# Patient Record
Sex: Female | Born: 1985 | Race: Black or African American | Hispanic: No | Marital: Single | Smoking: Never smoker
Health system: Southern US, Community
[De-identification: ages and names within clinical notes are randomized; demographics above are authoritative.]

## PROBLEM LIST (undated history)

## (undated) DIAGNOSIS — IMO0002 Reserved for concepts with insufficient information to code with codable children: Secondary | ICD-10-CM

## (undated) DIAGNOSIS — M545 Low back pain: Secondary | ICD-10-CM

## (undated) DIAGNOSIS — D649 Anemia, unspecified: Secondary | ICD-10-CM

## (undated) DIAGNOSIS — G43909 Migraine, unspecified, not intractable, without status migrainosus: Secondary | ICD-10-CM

## (undated) DIAGNOSIS — K219 Gastro-esophageal reflux disease without esophagitis: Secondary | ICD-10-CM

## (undated) HISTORY — DX: Migraine, unspecified, not intractable, without status migrainosus: G43.909

## (undated) HISTORY — PX: TONSILLECTOMY: SUR1361

---
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
## 2004-08-08 ENCOUNTER — Emergency Department: Payer: Self-pay | Admitting: Unknown Physician Specialty

## 2005-03-01 ENCOUNTER — Emergency Department: Payer: Self-pay | Admitting: Emergency Medicine

## 2005-03-01 ENCOUNTER — Other Ambulatory Visit: Payer: Self-pay

## 2005-10-16 ENCOUNTER — Emergency Department: Payer: Self-pay | Admitting: General Practice

## 2006-08-21 ENCOUNTER — Emergency Department: Payer: Self-pay | Admitting: Emergency Medicine

## 2006-12-04 ENCOUNTER — Emergency Department: Payer: Self-pay | Admitting: Emergency Medicine

## 2007-03-01 ENCOUNTER — Emergency Department: Payer: Self-pay | Admitting: Emergency Medicine

## 2008-08-15 ENCOUNTER — Emergency Department: Payer: Self-pay | Admitting: Emergency Medicine

## 2009-09-21 DIAGNOSIS — D509 Iron deficiency anemia, unspecified: Secondary | ICD-10-CM | POA: Insufficient documentation

## 2010-02-15 ENCOUNTER — Ambulatory Visit: Payer: Self-pay | Admitting: Family Medicine

## 2010-06-16 ENCOUNTER — Emergency Department: Payer: Self-pay | Admitting: Emergency Medicine

## 2010-07-13 ENCOUNTER — Emergency Department: Payer: Self-pay | Admitting: Internal Medicine

## 2010-11-03 ENCOUNTER — Observation Stay: Payer: Self-pay

## 2010-11-09 ENCOUNTER — Observation Stay: Payer: Self-pay | Admitting: Obstetrics and Gynecology

## 2011-01-28 ENCOUNTER — Observation Stay: Payer: Self-pay | Admitting: Obstetrics and Gynecology

## 2011-02-04 ENCOUNTER — Inpatient Hospital Stay: Payer: Self-pay | Admitting: Obstetrics & Gynecology

## 2012-08-04 ENCOUNTER — Emergency Department: Payer: Self-pay | Admitting: Emergency Medicine

## 2012-08-04 LAB — COMPREHENSIVE METABOLIC PANEL
Albumin: 3.3 g/dL — ABNORMAL LOW (ref 3.4–5.0)
Alkaline Phosphatase: 105 U/L (ref 50–136)
BUN: 14 mg/dL (ref 7–18)
Bilirubin,Total: 0.2 mg/dL (ref 0.2–1.0)
Calcium, Total: 8.5 mg/dL (ref 8.5–10.1)
Chloride: 105 mmol/L (ref 98–107)
Co2: 30 mmol/L (ref 21–32)
Creatinine: 0.81 mg/dL (ref 0.60–1.30)
EGFR (African American): >60
Glucose: 102 mg/dL — ABNORMAL HIGH (ref 65–99)
Osmolality: 274 (ref 275–301)
SGPT (ALT): 13 U/L (ref 12–78)
Total Protein: 8 g/dL (ref 6.4–8.2)

## 2012-08-04 LAB — URINALYSIS, COMPLETE
Bacteria: NONE SEEN
Glucose,UR: NEGATIVE mg/dL (ref 0–75)
Ketone: NEGATIVE
Nitrite: NEGATIVE
Ph: 6 (ref 4.5–8.0)
Protein: NEGATIVE

## 2012-08-04 LAB — CBC
HGB: 10.3 g/dL — ABNORMAL LOW (ref 12.0–16.0)
MCV: 67 fL — ABNORMAL LOW (ref 80–100)
Platelet: 413 10*3/uL (ref 150–440)
RBC: 4.93 10*6/uL (ref 3.80–5.20)

## 2013-06-13 ENCOUNTER — Emergency Department: Payer: Self-pay | Admitting: Emergency Medicine

## 2013-06-13 LAB — CBC
HCT: 29.7 % — AB (ref 35.0–47.0)
HGB: 9.3 g/dL — ABNORMAL LOW (ref 12.0–16.0)
MCH: 20.8 pg — ABNORMAL LOW (ref 26.0–34.0)
MCHC: 31.2 g/dL — ABNORMAL LOW (ref 32.0–36.0)
MCV: 67 fL — AB (ref 80–100)
Platelet: 404 10*3/uL (ref 150–440)
RBC: 4.44 10*6/uL (ref 3.80–5.20)
RDW: 18.8 % — ABNORMAL HIGH (ref 11.5–14.5)
WBC: 11.5 10*3/uL — ABNORMAL HIGH (ref 3.6–11.0)

## 2013-06-13 LAB — BASIC METABOLIC PANEL
Anion Gap: 5 — ABNORMAL LOW (ref 7–16)
BUN: 12 mg/dL (ref 7–18)
Calcium, Total: 8.8 mg/dL (ref 8.5–10.1)
Chloride: 107 mmol/L (ref 98–107)
Co2: 28 mmol/L (ref 21–32)
Creatinine: 0.83 mg/dL (ref 0.60–1.30)
EGFR (African American): >60
Glucose: 99 mg/dL (ref 65–99)
OSMOLALITY: 279 (ref 275–301)
Potassium: 3.8 mmol/L (ref 3.5–5.1)
Sodium: 140 mmol/L (ref 136–145)

## 2013-06-13 LAB — URINALYSIS, COMPLETE
Bacteria: NONE SEEN
Bilirubin,UR: NEGATIVE
Glucose,UR: NEGATIVE mg/dL (ref 0–75)
Ketone: NEGATIVE
NITRITE: NEGATIVE
Ph: 6 (ref 4.5–8.0)
Protein: 30
RBC,UR: 5164 /HPF (ref 0–5)
SPECIFIC GRAVITY: 1.02 (ref 1.003–1.030)
Squamous Epithelial: 2

## 2013-07-22 ENCOUNTER — Emergency Department: Payer: Self-pay | Admitting: Emergency Medicine

## 2013-07-25 ENCOUNTER — Ambulatory Visit: Payer: Self-pay | Admitting: Internal Medicine

## 2013-07-26 LAB — CANCER CENTER HEMOGLOBIN: HGB: 8.2 g/dL — ABNORMAL LOW (ref 12.0–16.0)

## 2013-08-04 LAB — HEMOGLOBIN: HGB: 8.7 g/dL — ABNORMAL LOW (ref 12.0–16.0)

## 2013-08-09 LAB — HEMOGLOBIN: HGB: 9.1 g/dL — AB (ref 12.0–16.0)

## 2013-08-12 ENCOUNTER — Ambulatory Visit: Payer: Self-pay | Admitting: Internal Medicine

## 2013-09-13 ENCOUNTER — Ambulatory Visit: Payer: Self-pay | Admitting: Internal Medicine

## 2014-02-01 ENCOUNTER — Emergency Department: Payer: Self-pay | Admitting: Emergency Medicine

## 2014-02-01 LAB — URINALYSIS, COMPLETE
BILIRUBIN, UR: NEGATIVE
Bacteria: NONE SEEN
Blood: NEGATIVE
Glucose,UR: NEGATIVE mg/dL (ref 0–75)
Hyaline Cast: 2
Ketone: NEGATIVE
NITRITE: NEGATIVE
Ph: 6 (ref 4.5–8.0)
Protein: NEGATIVE
RBC,UR: 2 /HPF (ref 0–5)
Specific Gravity: 1.024 (ref 1.003–1.030)
Squamous Epithelial: 5

## 2014-12-14 ENCOUNTER — Ambulatory Visit: Payer: Self-pay | Admitting: Family Medicine

## 2014-12-17 ENCOUNTER — Encounter: Payer: Self-pay | Admitting: Emergency Medicine

## 2014-12-17 ENCOUNTER — Emergency Department
Admission: EM | Admit: 2014-12-17 | Discharge: 2014-12-18 | Disposition: A | Discharge status: Home / Self Care | Payer: 59 | Attending: Emergency Medicine | Admitting: Emergency Medicine

## 2014-12-17 DIAGNOSIS — R1013 Epigastric pain: Secondary | ICD-10-CM | POA: Insufficient documentation

## 2014-12-17 DIAGNOSIS — R5381 Other malaise: Secondary | ICD-10-CM | POA: Diagnosis not present

## 2014-12-17 DIAGNOSIS — N390 Urinary tract infection, site not specified: Secondary | ICD-10-CM | POA: Insufficient documentation

## 2014-12-17 DIAGNOSIS — Z3202 Encounter for pregnancy test, result negative: Secondary | ICD-10-CM | POA: Insufficient documentation

## 2014-12-17 DIAGNOSIS — R109 Unspecified abdominal pain: Secondary | ICD-10-CM | POA: Diagnosis present

## 2014-12-17 HISTORY — DX: Anemia, unspecified: D64.9

## 2014-12-17 LAB — COMPREHENSIVE METABOLIC PANEL
ALK PHOS: 93 U/L (ref 38–126)
ALT: 11 U/L — ABNORMAL LOW (ref 14–54)
AST: 22 U/L (ref 15–41)
Albumin: 3.5 g/dL (ref 3.5–5.0)
Anion gap: 6 (ref 5–15)
BILIRUBIN TOTAL: 0.2 mg/dL — AB (ref 0.3–1.2)
BUN: 13 mg/dL (ref 6–20)
CALCIUM: 8.9 mg/dL (ref 8.9–10.3)
CO2: 28 mmol/L (ref 22–32)
Chloride: 104 mmol/L (ref 101–111)
Creatinine, Ser: 0.64 mg/dL (ref 0.44–1.00)
GFR calc Af Amer: >60 mL/min (ref >60)
GLUCOSE: 120 mg/dL — AB (ref 65–99)
POTASSIUM: 3.9 mmol/L (ref 3.5–5.1)
Sodium: 138 mmol/L (ref 135–145)
TOTAL PROTEIN: 7.8 g/dL (ref 6.5–8.1)

## 2014-12-17 LAB — CBC
HEMATOCRIT: 31.6 % — AB (ref 35.0–47.0)
Hemoglobin: 9.6 g/dL — ABNORMAL LOW (ref 12.0–16.0)
MCH: 18.8 pg — ABNORMAL LOW (ref 26.0–34.0)
MCHC: 30.3 g/dL — AB (ref 32.0–36.0)
MCV: 62.1 fL — ABNORMAL LOW (ref 80.0–100.0)
PLATELETS: 466 10*3/uL — AB (ref 150–440)
RBC: 5.09 MIL/uL (ref 3.80–5.20)
RDW: 20.8 % — AB (ref 11.5–14.5)
WBC: 12.2 10*3/uL — AB (ref 3.6–11.0)

## 2014-12-17 LAB — URINALYSIS COMPLETE WITH MICROSCOPIC (ARMC ONLY)
BILIRUBIN URINE: NEGATIVE
GLUCOSE, UA: NEGATIVE mg/dL
HGB URINE DIPSTICK: NEGATIVE
KETONES UR: NEGATIVE mg/dL
NITRITE: NEGATIVE
Protein, ur: NEGATIVE mg/dL
SPECIFIC GRAVITY, URINE: 1.024 (ref 1.005–1.030)
pH: 7 (ref 5.0–8.0)

## 2014-12-17 LAB — LIPASE, BLOOD: Lipase: 25 U/L (ref 22–51)

## 2014-12-17 LAB — POCT PREGNANCY, URINE: PREG TEST UR: NEGATIVE

## 2014-12-17 NOTE — ED Notes (Addendum)
Pt reports LUQ pain, reports drinking alcohol last night. Pt reports weakness and "really really bad and weird". Pt reports nausea, denies vomiting or diarrhea. Pt reports hx of anemia and needing 2 blood transfusions in the past, reports feeling the same today.

## 2014-12-18 MED ORDER — SULFAMETHOXAZOLE-TRIMETHOPRIM 800-160 MG PO TABS: 1.0000 | ORAL_TABLET | Freq: Two times a day (BID) | ORAL | Status: DC

## 2014-12-18 MED ORDER — FAMOTIDINE 40 MG/5ML PO SUSR: 20.0000 mg | Freq: Two times a day (BID) | ORAL | Status: DC

## 2014-12-18 MED ORDER — DEXTROSE 5 % IV SOLN
1.0000 g | INTRAVENOUS | Status: DC
  Administered 2014-12-18: 1 g via INTRAVENOUS
  Filled 2014-12-18: qty 10

## 2014-12-18 MED ORDER — SODIUM CHLORIDE 0.9 % IV BOLUS (SEPSIS)
1000.0000 mL | Freq: Once | INTRAVENOUS | Status: AC
  Administered 2014-12-18: 1000 mL via INTRAVENOUS

## 2014-12-18 MED ORDER — ONDANSETRON HCL 4 MG/2ML IJ SOLN
4.0000 mg | Freq: Once | INTRAMUSCULAR | Status: AC
  Administered 2014-12-18: 4 mg via INTRAVENOUS
  Filled 2014-12-18: qty 2

## 2014-12-18 MED ORDER — PANTOPRAZOLE SODIUM 40 MG IV SOLR
40.0000 mg | Freq: Once | INTRAVENOUS | Status: AC
  Administered 2014-12-18: 40 mg via INTRAVENOUS
  Filled 2014-12-18: qty 40

## 2014-12-18 NOTE — Discharge Instructions (Signed)
1. Take antibiotic as prescribed (Septra DS twice daily 7 days). 2. Take Pepcid twice daily. 3. Return to the ER for worsening symptoms, persistent vomiting, fever or other concerns.  Abdominal Pain Many things can cause belly (abdominal) pain. Most times, the belly pain is not dangerous. Many cases of belly pain can be watched and treated at home. HOME CARE   Do not take medicines that help you go poop (laxatives) unless told to by your doctor.  Only take medicine as told by your doctor.  Eat or drink as told by your doctor. Your doctor will tell you if you should be on a special diet. GET HELP IF:  You do not know what is causing your belly pain.  You have belly pain while you are sick to your stomach (nauseous) or have runny poop (diarrhea).  You have pain while you pee or poop.  Your belly pain wakes you up at night.  You have belly pain that gets worse or better when you eat.  You have belly pain that gets worse when you eat fatty foods.  You have a fever. GET HELP RIGHT AWAY IF:   The pain does not go away within 2 hours.  You keep throwing up (vomiting).  The pain changes and is only in the right or left part of the belly.  You have bloody or tarry looking poop. MAKE SURE YOU:   Understand these instructions.  Will watch your condition.  Will get help right away if you are not doing well or get worse. Document Released: 09/17/2007 Document Revised: 04/05/2013 Document Reviewed: 12/08/2012 [facility] Patient Information 2015 [place], [place]. This information is not intended to replace advice given to you by your health care provider. Make sure you discuss any questions you have with your health care provider.  Urinary Tract Infection Urinary tract infections (UTIs) can develop anywhere along your urinary tract. Your urinary tract is your body's drainage system for removing wastes and extra water. Your urinary tract includes two kidneys, two ureters, a bladder, and  a urethra. Your kidneys are a pair of bean-shaped organs. Each kidney is about the size of your fist. They are located below your ribs, one on each side of your spine. CAUSES Infections are caused by microbes, which are microscopic organisms, including fungi, viruses, and bacteria. These organisms are so small that they can only be seen through a microscope. Bacteria are the microbes that most commonly cause UTIs. SYMPTOMS  Symptoms of UTIs may vary by age and gender of the patient and by the location of the infection. Symptoms in young women typically include a frequent and intense urge to urinate and a painful, burning feeling in the bladder or urethra during urination. Older women and men are more likely to be tired, shaky, and weak and have muscle aches and abdominal pain. A fever may mean the infection is in your kidneys. Other symptoms of a kidney infection include pain in your back or sides below the ribs, nausea, and vomiting. DIAGNOSIS To diagnose a UTI, your caregiver will ask you about your symptoms. Your caregiver also will ask to provide a urine sample. The urine sample will be tested for bacteria and white blood cells. White blood cells are made by your body to help fight infection. TREATMENT  Typically, UTIs can be treated with medication. Because most UTIs are caused by a bacterial infection, they usually can be treated with the use of antibiotics. The choice of antibiotic and length of treatment depend  on your symptoms and the type of bacteria causing your infection. HOME CARE INSTRUCTIONS  If you were prescribed antibiotics, take them exactly as your caregiver instructs you. Finish the medication even if you feel better after you have only taken some of the medication.  Drink enough water and fluids to keep your urine clear or pale yellow.  Avoid caffeine, tea, and carbonated beverages. They tend to irritate your bladder.  Empty your bladder often. Avoid holding urine for long  periods of time.  Empty your bladder before and after sexual intercourse.  After a bowel movement, women should cleanse from front to back. Use each tissue only once. SEEK MEDICAL CARE IF:   You have back pain.  You develop a fever.  Your symptoms do not begin to resolve within 3 days. SEEK IMMEDIATE MEDICAL CARE IF:   You have severe back pain or lower abdominal pain.  You develop chills.  You have nausea or vomiting.  You have continued burning or discomfort with urination. MAKE SURE YOU:   Understand these instructions.  Will watch your condition.  Will get help right away if you are not doing well or get worse. Document Released: 01/08/2005 Document Revised: 09/30/2011 Document Reviewed: 05/09/2011 [facility] Patient Information 2015 [place], [place]. This information is not intended to replace advice given to you by your health care provider. Make sure you discuss any questions you have with your health care provider.

## 2014-12-18 NOTE — ED Notes (Signed)
MD at bedside for eval.

## 2014-12-18 NOTE — ED Provider Notes (Signed)
[facility] Emergency Department Provider Note  ____________________________________________  Time seen: Approximately 12:21 AM  I have reviewed the triage vital signs and the nursing notes.   HISTORY  Chief Complaint Abdominal Pain    HPI Sherri Robertson is a 29 y.o. female who presents to the ED from home with a chief complaint of generalized malaise, epigastric burning and "feeling really bad and weird". Patient reports heavy EtOH use last evening. Denies fall, trauma, injury. Denies fever, chills, chest pain, shortness of breath, vomiting, diarrhea. Endorses nausea.   Past Medical History  Diagnosis Date  . Anemia     There are no active problems to display for this patient.   Past Surgical History  Procedure Laterality Date  . Cesarean section      No current outpatient prescriptions on file.  Allergies Review of patient's allergies indicates no known allergies.  No family history on file.  Social History Social History  Substance Use Topics  . Smoking status: Never Smoker   . Smokeless tobacco: None  . Alcohol Use: Yes     Comment: occassional    Review of Systems Constitutional: Positive for generalized malaise. No fever/chills Eyes: No visual changes. ENT: No sore throat. Cardiovascular: Denies chest pain. Respiratory: Denies shortness of breath. Gastrointestinal: Positive for abdominal pain.  Positive for nausea, no vomiting.  No diarrhea.  No constipation. Genitourinary: Negative for dysuria. Musculoskeletal: Negative for back pain. Skin: Negative for rash. Neurological: Negative for headaches, focal weakness or numbness.  10-point ROS otherwise negative.  ____________________________________________   PHYSICAL EXAM:  VITAL SIGNS: ED Triage Vitals  Enc Vitals Group     BP 12/17/14 2056 125/82 mmHg     Pulse Rate 12/17/14 2056 77     Resp 12/17/14 2056 16     Temp 12/17/14 2056 98.7 F (37.1 C)     Temp  Source 12/17/14 2056 Oral     SpO2 12/17/14 2056 99 %     Weight 12/17/14 2056 265 lb (120.203 kg)     Height 12/17/14 2056 5\' 2"  (1.575 m)     Head Cir --      Peak Flow --      Pain Score 12/17/14 2056 9     Pain Loc --      Pain Edu? --      Excl. in [place]? --     Constitutional: Alert and oriented. Well appearing and in no acute distress. Eyes: Conjunctivae are normal. PERRL. EOMI. Head: Atraumatic. Nose: No congestion/rhinnorhea. Mouth/Throat: Mucous membranes are moist.  Oropharynx non-erythematous. Neck: No stridor.   Cardiovascular: Normal rate, regular rhythm. Grossly normal heart sounds.  Good peripheral circulation. Respiratory: Normal respiratory effort.  No retractions. Lungs CTAB. Gastrointestinal: Soft and minimally tender to epigastrium without rebound or guarding. No distention. No abdominal bruits. No CVA tenderness. Musculoskeletal: No lower extremity tenderness nor edema.  No joint effusions. Neurologic:  Normal speech and language. No gross focal neurologic deficits are appreciated. No gait instability. Skin:  Skin is warm, dry and intact. No rash noted. Psychiatric: Mood and affect are normal. Speech and behavior are normal.  ____________________________________________   LABS (all labs ordered are listed, but only abnormal results are displayed)  Labs Reviewed  COMPREHENSIVE METABOLIC PANEL - Abnormal; Notable for the following:    Glucose, Bld 120 (*)    ALT 11 (*)    Total Bilirubin 0.2 (*)    All other components within normal limits  CBC - Abnormal; Notable for the  following:    WBC 12.2 (*)    Hemoglobin 9.6 (*)    HCT 31.6 (*)    MCV 62.1 (*)    MCH 18.8 (*)    MCHC 30.3 (*)    RDW 20.8 (*)    Platelets 466 (*)    All other components within normal limits  URINALYSIS COMPLETEWITH MICROSCOPIC ([facility] ONLY) - Abnormal; Notable for the following:    Color, Urine YELLOW (*)    APPearance HAZY (*)    Leukocytes, UA 3+ (*)    Bacteria, UA RARE (*)     Squamous Epithelial / LPF 6-30 (*)    All other components within normal limits  LIPASE, BLOOD  POC URINE PREG, ED  POCT PREGNANCY, URINE   ____________________________________________  EKG  None ____________________________________________  RADIOLOGY  None ____________________________________________   PROCEDURES  Procedure(s) performed: None  Critical Care performed: No  ____________________________________________   INITIAL IMPRESSION / ASSESSMENT AND PLAN / ED COURSE  Pertinent labs & imaging results that were available during my care of the patient were reviewed by me and considered in my medical decision making (see chart for details).  29 year old female who presents with generalized malaise, epigastric discomfort and nausea after heavy EtOH use last evening. Laboratory and urinalysis results remarkable for UTI. Will infuse IV fluids, IV antibiotic and Protonix.  ----------------------------------------- 2:37 AM on 12/18/2014 -----------------------------------------  Patient improved. Discussed with patient and spouse and given strict return precautions. Both verbalize understanding and agree with plan of care. ____________________________________________   FINAL CLINICAL IMPRESSION(S) / ED DIAGNOSES  Final diagnoses:  UTI (lower urinary tract infection)  Epigastric pain  Lenn Sink, MD 12/18/14 [phone]

## 2015-01-26 ENCOUNTER — Ambulatory Visit (INDEPENDENT_AMBULATORY_CARE_PROVIDER_SITE_OTHER): Payer: 59 | Admitting: Family Medicine

## 2015-01-26 ENCOUNTER — Encounter: Payer: Self-pay | Admitting: Family Medicine

## 2015-01-26 VITALS — BP 124/80 | HR 91 | Temp 98.6°F | Resp 16 | Wt 269.8 lb

## 2015-01-26 DIAGNOSIS — F32A Depression, unspecified: Secondary | ICD-10-CM | POA: Insufficient documentation

## 2015-01-26 DIAGNOSIS — R5383 Other fatigue: Secondary | ICD-10-CM | POA: Diagnosis not present

## 2015-01-26 DIAGNOSIS — M545 Low back pain, unspecified: Secondary | ICD-10-CM

## 2015-01-26 DIAGNOSIS — D509 Iron deficiency anemia, unspecified: Secondary | ICD-10-CM

## 2015-01-26 DIAGNOSIS — F419 Anxiety disorder, unspecified: Secondary | ICD-10-CM

## 2015-01-26 DIAGNOSIS — F329 Major depressive disorder, single episode, unspecified: Secondary | ICD-10-CM | POA: Insufficient documentation

## 2015-01-26 DIAGNOSIS — R7309 Other abnormal glucose: Secondary | ICD-10-CM | POA: Insufficient documentation

## 2015-01-26 DIAGNOSIS — IMO0002 Reserved for concepts with insufficient information to code with codable children: Secondary | ICD-10-CM

## 2015-01-26 DIAGNOSIS — D219 Benign neoplasm of connective and other soft tissue, unspecified: Secondary | ICD-10-CM | POA: Insufficient documentation

## 2015-01-26 DIAGNOSIS — N946 Dysmenorrhea, unspecified: Secondary | ICD-10-CM | POA: Insufficient documentation

## 2015-01-26 HISTORY — DX: Low back pain: M54.5

## 2015-01-26 HISTORY — DX: Low back pain, unspecified: M54.50

## 2015-01-26 HISTORY — DX: Reserved for concepts with insufficient information to code with codable children: IMO0002

## 2015-01-26 MED ORDER — PHENTERMINE HCL 37.5 MG PO CAPS: 37.5000 mg | ORAL_CAPSULE | Freq: Every day | ORAL | Status: DC

## 2015-01-26 NOTE — Progress Notes (Signed)
Name: Sherri Robertson   MRN: [id number]    DOB: February 05, 1986   Date:01/26/2015       Progress Note  Subjective  Chief Complaint  Chief Complaint  Patient presents with  . Weight Loss    patient wants to talk about getting help with weight loss. patient is walking and has changed her diet (healthier choices & portion control). patient wants to go back on the phenteremine.    HPI  Patient is here for routine follow up of Obesity. The patient is appropriately concerned about their weight. Onset of weight issues started many years ago. Highest recorded weight 270 lbs. Associated symptoms or diseases include pain in weight bearing joints, poor self esteem. No HTN, HLD, DMII. Current efforts to reduce weight include better eating habits. Has used Adipex in the past with good results and no side effects. Describe current exercise regimen: infrequently Describe current diet regimen: more balanced choices and portion controling   Taking new iron supplement from vitamin shop that is supposed to be better absorbed. Still having some fatigue but now worse than usual. Denies bleeding with BM or heavy menses at this time.    Past Medical History  Diagnosis Date  . Anemia     Patient Active Problem List   Diagnosis Date Noted  . Abnormal cervical Pap smear with positive HPV DNA test 01/26/2015  . Anxiety and depression 01/26/2015  . Dysmenorrhea 01/26/2015  . LBP (low back pain) 01/26/2015  . Fibroid 01/26/2015  . Anemia, iron deficiency 09/21/2009  . Extreme obesity ([place]) 07/27/2009    Social History  Substance Use Topics  . Smoking status: Never Smoker   . Smokeless tobacco: Not on file  . Alcohol Use: Yes     Comment: occassional     Current outpatient prescriptions:  .  famotidine (PEPCID) 40 MG/5ML suspension, Take 2.5 mLs (20 mg total) by mouth 2 (two) times daily before a meal., Disp: 50 mL, Rfl: 0  Past Surgical History  Procedure Laterality Date  . Cesarean section       Family History  Problem Relation Age of Onset  . Hypertension Mother   . Depression Mother   . Hyperlipidemia Mother     No Known Allergies   Review of Systems  CONSTITUTIONAL: Yes significant weight changes. No fever, chills, weakness or fatigue.  HEENT:  - Eyes: No visual changes.  - Ears: No auditory changes. No pain.  - Nose: No sneezing, congestion, runny nose. - Throat: No sore throat. No changes in swallowing. SKIN: No rash or itching.  CARDIOVASCULAR: No chest pain, chest pressure or chest discomfort. No palpitations or edema.  RESPIRATORY: No shortness of breath, cough or sputum.  GASTROINTESTINAL: No anorexia, nausea, vomiting. No changes in bowel habits. No abdominal pain or blood.  MUSCULOSKELETAL: Chronic joint pain. No muscle pain. PSYCHIATRIC: No change in mood. No change in sleep pattern.  ENDOCRINOLOGIC: No reports of sweating, cold or heat intolerance. No polyuria or polydipsia.     Objective  BP 124/80 mmHg  Pulse 91  Temp(Src) 98.6 F (37 C) (Oral)  Resp 16  Wt 269 lb 12.8 oz (122.38 kg)  SpO2 98% Body mass index is 49.33 kg/(m^2).  Physical Exam  Constitutional: Patient is morbidly obese and well-nourished. In no distress.  HEENT:  - Head: Normocephalic and atraumatic.  - Ears: Bilateral TMs gray, no erythema or effusion - Nose: Nasal mucosa moist - Mouth/Throat: Oropharynx is clear and moist. No tonsillar hypertrophy or erythema. No post  nasal drainage.  - Eyes: Conjunctivae clear, EOM movements normal. PERRLA. No scleral icterus.  Neck: Normal range of motion. Neck supple. No JVD present. No thyromegaly present.  Cardiovascular: Normal rate, regular rhythm and normal heart sounds.  No murmur heard.  Pulmonary/Chest: Effort normal and breath sounds normal. No respiratory distress. Musculoskeletal: Normal range of motion bilateral UE and LE, no joint effusions. Peripheral vascular: Bilateral LE no edema. Neurological: CN II-XII grossly  intact with no focal deficits. Alert and oriented to person, place, and time. Coordination, balance, strength, speech and gait are normal.  Skin: Skin is warm and dry. No rash noted. No erythema.  Psychiatric: Patient has a normal mood and affect. Behavior is normal in office today. Judgment and thought content normal in office today.   Recent Results (from the past 2160 hour(s))  Lipase, blood     Status: None   Collection Time: 12/17/14  9:01 PM  Result Value Ref Range   Lipase 25 22 - 51 U/L  Comprehensive metabolic panel     Status: Abnormal   Collection Time: 12/17/14  9:01 PM  Result Value Ref Range   Sodium 138 135 - 145 mmol/L   Potassium 3.9 3.5 - 5.1 mmol/L   Chloride 104 101 - 111 mmol/L   CO2 28 22 - 32 mmol/L   Glucose, Bld 120 (H) 65 - 99 mg/dL   BUN 13 6 - 20 mg/dL   Creatinine, Ser 8.08 0.44 - 1.00 mg/dL   Calcium 8.9 8.9 - 13.8 mg/dL   Total Protein 7.8 6.5 - 8.1 g/dL   Albumin 3.5 3.5 - 5.0 g/dL   AST 22 15 - 41 U/L   ALT 11 (L) 14 - 54 U/L   Alkaline Phosphatase 93 38 - 126 U/L   Total Bilirubin 0.2 (L) 0.3 - 1.2 mg/dL   GFR calc non Af Amer >60 >60 mL/min   GFR calc Af Amer >60 >60 mL/min    Comment: (NOTE) The eGFR has been calculated using the CKD EPI equation. This calculation has not been validated in all clinical situations. eGFR's persistently <60 mL/min signify possible Chronic Kidney Disease.    Anion gap 6 5 - 15  CBC     Status: Abnormal   Collection Time: 12/17/14  9:01 PM  Result Value Ref Range   WBC 12.2 (H) 3.6 - 11.0 K/uL   RBC 5.09 3.80 - 5.20 MIL/uL   Hemoglobin 9.6 (L) 12.0 - 16.0 g/dL   HCT 40.2 (L) 01.1 - 46.6 %   MCV 62.1 (L) 80.0 - 100.0 fL   MCH 18.8 (L) 26.0 - 34.0 pg   MCHC 30.3 (L) 32.0 - 36.0 g/dL   RDW 19.8 (H) 20.4 - 58.0 %   Platelets 466 (H) 150 - 440 K/uL  Urinalysis complete, with microscopic ([facility] only)     Status: Abnormal   Collection Time: 12/17/14  9:01 PM  Result Value Ref Range   Color, Urine YELLOW (A)  YELLOW   APPearance HAZY (A) CLEAR   Glucose, UA NEGATIVE NEGATIVE mg/dL   Bilirubin Urine NEGATIVE NEGATIVE   Ketones, ur NEGATIVE NEGATIVE mg/dL   Specific Gravity, Urine 1.024 1.005 - 1.030   Hgb urine dipstick NEGATIVE NEGATIVE   pH 7.0 5.0 - 8.0   Protein, ur NEGATIVE NEGATIVE mg/dL   Nitrite NEGATIVE NEGATIVE   Leukocytes, UA 3+ (A) NEGATIVE   RBC / HPF 0-5 0 - 5 RBC/hpf   WBC, UA 6-30 0 - 5 WBC/hpf  Bacteria, UA RARE (A) NONE SEEN   Squamous Epithelial / LPF 6-30 (A) NONE SEEN   Mucous PRESENT   Pregnancy, urine POC     Status: None   Collection Time: 12/17/14  9:13 PM  Result Value Ref Range   Preg Test, Ur NEGATIVE NEGATIVE    Comment:        THE SENSITIVITY OF THIS METHODOLOGY IS >24 mIU/mL      Assessment & Plan  1. Obesity, Class III, BMI 40-49.9 (morbid obesity) ([place]) The patient has been counseled on their higher than normal BMI.  They have verbally expressed understanding their increased risk for other diseases.  In efforts to meet a better target BMI goal the patient has been counseled on lifestyle, diet and exercise modification tactics. Start with moderate intensity aerobic exercise (walking, jogging, elliptical, swimming, group or individual sports, hiking) at least 34mins a day at least 4 days a week and increase intensity, duration, frequency as tolerated. Diet should include well balance fresh fruits and vegetables avoiding processed foods, carbohydrates and sugars. Drink at least 8oz 10 glasses a day avoiding sodas, sugary fruit drinks, sweetened tea. Check weight on a reliable scale daily and monitor weight loss progress daily. Consider investing in mobile phone apps that will help keep track of weight loss goals.  - CBC with Differential/Platelet - Comprehensive metabolic panel - Hemoglobin A1c - Lipid panel - TSH - phentermine 37.5 MG capsule; Take 1 capsule (37.5 mg total) by mouth daily.  Dispense: 30 capsule; Refill: 0  2. Anemia, iron  deficiency Recheck lab work. Continue iron supplement  - CBC with Differential/Platelet - Comprehensive metabolic panel - Hemoglobin A1c - Lipid panel - TSH  3. Elevated glucose Will rule out DM II.  - CBC with Differential/Platelet - Comprehensive metabolic panel - Hemoglobin A1c - Lipid panel - TSH  4. Other fatigue Multifactorial.   - CBC with Differential/Platelet - Comprehensive metabolic panel - Hemoglobin A1c - Lipid panel - TSH

## 2015-01-26 NOTE — Patient Instructions (Signed)
GOAL WEIGHT BY NEXT 1 MONTH VISIT IS 160 LBS (Slightly less than 5% OF CURRENT WEIGHT 170LBS)  Exercising to Lose Weight Exercising can help you to lose weight. In order to lose weight through exercise, you need to do vigorous-intensity exercise. You can tell that you are exercising with vigorous intensity if you are breathing very hard and fast and cannot hold a conversation while exercising. Moderate-intensity exercise helps to maintain your current weight. You can tell that you are exercising at a moderate level if you have a higher heart rate and faster breathing, but you are still able to hold a conversation. HOW OFTEN SHOULD I EXERCISE? Choose an activity that you enjoy and set realistic goals. Your health care provider can help you to make an activity plan that works for you. Exercise regularly as directed by your health care provider. This may include:  Doing resistance training twice each week, such as:  Push-ups.  Sit-ups.  Lifting weights.  Using resistance bands.  Doing a given intensity of exercise for a given amount of time. Choose from these options:  150 minutes of moderate-intensity exercise every week.  75 minutes of vigorous-intensity exercise every week.  A mix of moderate-intensity and vigorous-intensity exercise every week. Children, pregnant women, people who are out of shape, people who are overweight, and older adults may need to consult a health care provider for individual recommendations. If you have any sort of medical condition, be sure to consult your health care provider before starting a new exercise program. WHAT ARE SOME ACTIVITIES THAT CAN HELP ME TO LOSE WEIGHT?   Walking at a rate of at least 4.5 miles an hour.  Jogging or running at a rate of 5 miles per hour.  Biking at a rate of at least 10 miles per hour.  Lap swimming.  Roller-skating or in-line skating.  Cross-country skiing.  Vigorous competitive sports, such as football,  basketball, and soccer.  Jumping rope.  Aerobic dancing. HOW CAN I BE MORE ACTIVE IN MY DAY-TO-DAY ACTIVITIES?  Use the stairs instead of the elevator.  Take a walk during your lunch break.  If you drive, park your car farther away from work or school.  If you take public transportation, get off one stop early and walk the rest of the way.  Make all of your phone calls while standing up and walking around.  Get up, stretch, and walk around every 30 minutes throughout the day. WHAT GUIDELINES SHOULD I FOLLOW WHILE EXERCISING?  Do not exercise so much that you hurt yourself, feel dizzy, or get very short of breath.  Consult your health care provider prior to starting a new exercise program.  Wear comfortable clothes and shoes with good support.  Drink plenty of water while you exercise to prevent dehydration or heat stroke. Body water is lost during exercise and must be replaced.  Work out until you breathe faster and your heart beats faster.   This information is not intended to replace advice given to you by your health care provider. Make sure you discuss any questions you have with your health care provider.   Document Released: 05/03/2010 Document Revised: 04/21/2014 Document Reviewed: 09/01/2013 Elsevier Interactive Patient Education [employer].

## 2015-02-26 ENCOUNTER — Ambulatory Visit (INDEPENDENT_AMBULATORY_CARE_PROVIDER_SITE_OTHER): Payer: 59 | Admitting: Family Medicine

## 2015-02-26 ENCOUNTER — Encounter: Payer: Self-pay | Admitting: Family Medicine

## 2015-02-26 VITALS — BP 122/76 | HR 115 | Temp 98.6°F | Resp 18 | Wt 265.2 lb

## 2015-02-26 DIAGNOSIS — R5383 Other fatigue: Secondary | ICD-10-CM | POA: Diagnosis not present

## 2015-02-26 DIAGNOSIS — M545 Low back pain, unspecified: Secondary | ICD-10-CM

## 2015-02-26 DIAGNOSIS — R7309 Other abnormal glucose: Secondary | ICD-10-CM

## 2015-02-26 LAB — POCT URINALYSIS DIPSTICK
Bilirubin, UA: NEGATIVE
Blood, UA: NEGATIVE
Glucose, UA: NEGATIVE
Ketones, UA: NEGATIVE
Nitrite, UA: NEGATIVE
Spec Grav, UA: 1.01
Urobilinogen, UA: 0.2
pH, UA: 7.5

## 2015-02-26 MED ORDER — PHENTERMINE HCL 37.5 MG PO TABS: 37.5000 mg | ORAL_TABLET | Freq: Every day | ORAL | Status: DC

## 2015-02-26 NOTE — Progress Notes (Signed)
Name: Sherri Robertson   MRN: [id number]    DOB: 08-01-1985   Date:02/26/2015       Progress Note  Subjective  Chief Complaint  Chief Complaint  Patient presents with  . Obesity    patient is here for a 4-week follow.  . Labs Only    patient did not have her previous labs done but is fasting today  . Urinary Tract Infection    lower back pain    HPI  Sherri Robertson is a 29 year old patient who is here for routine follow up of Obesity. The patient is appropriately concerned about their weight. Onset of weight issues started many years ago. Highest recorded weight 270 lbs. Associated symptoms or diseases include pain in weight bearing joints, poor self esteem. No HTN, HLD, DMII. Current efforts to reduce weight include better eating habits. Has used Adipex in the past with good results and no side effects and so was restarted on the medication 1 month ago. Describe current exercise regimen: infrequently Describe current diet regimen: more balanced choices and portion controling   Having lower back pain and wants to rule out UTI. No dysuria, frequency, pelvic pain.   Did not get blood work done from last visit. Requesting reprint of order.s   Past Medical History  Diagnosis Date  . Anemia     Patient Active Problem List   Diagnosis Date Noted  . Abnormal cervical Pap smear with positive HPV DNA test 01/26/2015  . Anxiety and depression 01/26/2015  . Dysmenorrhea 01/26/2015  . LBP (low back pain) 01/26/2015  . Fibroid 01/26/2015  . Elevated glucose 01/26/2015  . Other fatigue 01/26/2015  . Anemia, iron deficiency 09/21/2009  . Obesity, Class III, BMI 40-49.9 (morbid obesity) ([place]) 07/27/2009    Social History  Substance Use Topics  . Smoking status: Never Smoker   . Smokeless tobacco: Not on file  . Alcohol Use: Yes     Comment: occassional     Current outpatient prescriptions:  .  famotidine (PEPCID) 40 MG/5ML suspension, Take 2.5 mLs (20 mg total) by mouth 2  (two) times daily before a meal., Disp: 50 mL, Rfl: 0 .  phentermine 37.5 MG capsule, Take 1 capsule (37.5 mg total) by mouth daily., Disp: 30 capsule, Rfl: 0  Past Surgical History  Procedure Laterality Date  . Cesarean section      Family History  Problem Relation Age of Onset  . Hypertension Mother   . Depression Mother   . Hyperlipidemia Mother     No Known Allergies   Review of Systems  CONSTITUTIONAL: No significant weight changes. No fever, chills, weakness or fatigue.  HEENT:  - Eyes: No visual changes.  - Ears: No auditory changes. No pain.  - Nose: No sneezing, congestion, runny nose. - Throat: No sore throat. No changes in swallowing. SKIN: No rash or itching.  CARDIOVASCULAR: No chest pain, chest pressure or chest discomfort. No palpitations or edema.  RESPIRATORY: No shortness of breath, cough or sputum.  GASTROINTESTINAL: No anorexia, nausea, vomiting. No changes in bowel habits. No abdominal pain or blood.  MUSCULOSKELETAL: Chronic joint pain. No muscle pain. PSYCHIATRIC: No change in mood. No change in sleep pattern.  ENDOCRINOLOGIC: No reports of sweating, cold or heat intolerance. No polyuria or polydipsia.    Objective  BP 122/76 mmHg  Pulse 115  Temp(Src) 98.6 F (37 C) (Oral)  Resp 18  Wt 265 lb 3.2 oz (120.294 kg)  SpO2 98%  LMP 02/07/2015 Body  mass index is 48.49 kg/(m^2).  Physical Exam  Constitutional: Patient is morbidly obese and well-nourished. In no distress.  HEENT:  - Head: Normocephalic and atraumatic.  - Ears: Bilateral TMs gray, no erythema or effusion - Nose: Nasal mucosa moist - Mouth/Throat: Oropharynx is clear and moist. No tonsillar hypertrophy or erythema. No post nasal drainage.  - Eyes: Conjunctivae clear, EOM movements normal. PERRLA. No scleral icterus.  Neck: Normal range of motion. Neck supple. No JVD present. No thyromegaly present.  Cardiovascular: Normal rate, regular rhythm and normal heart sounds.  No murmur heard.  Pulmonary/Chest: Effort normal and breath sounds normal. No respiratory distress. Musculoskeletal: Normal range of motion bilateral UE and LE, no joint effusions. Peripheral vascular: Bilateral LE no edema. Neurological: CN II-XII grossly intact with no focal deficits. Alert and oriented to person, place, and time. Coordination, balance, strength, speech and gait are normal.  Skin: Skin is warm and dry. No rash noted. No erythema.  Psychiatric: Patient has a normal mood and affect. Behavior is normal in office today. Judgment and thought content normal in office today.   Recent Results (from the past 2160 hour(s))  Lipase, blood     Status: None   Collection Time: 12/17/14  9:01 PM  Result Value Ref Range   Lipase 25 22 - 51 U/L  Comprehensive metabolic panel     Status: Abnormal   Collection Time: 12/17/14  9:01 PM  Result Value Ref Range   Sodium 138 135 - 145 mmol/L   Potassium 3.9 3.5 - 5.1 mmol/L   Chloride 104 101 - 111 mmol/L   CO2 28 22 - 32 mmol/L   Glucose, Bld 120 (H) 65 - 99 mg/dL   BUN 13 6 - 20 mg/dL   Creatinine, Ser 0.64 0.44 - 1.00 mg/dL   Calcium 8.9 8.9 - 10.3 mg/dL   Total Protein 7.8 6.5 - 8.1 g/dL   Albumin 3.5 3.5 - 5.0 g/dL   AST 22 15 - 41 U/L   ALT 11 (L) 14 - 54 U/L   Alkaline Phosphatase 93 38 - 126 U/L   Total Bilirubin 0.2 (L) 0.3 - 1.2 mg/dL   GFR calc non Af Amer >60 >60 mL/min   GFR calc Af Amer >60 >60 mL/min    Comment: (NOTE) The eGFR has been calculated using the CKD EPI equation. This calculation has not been validated in all clinical situations. eGFR's persistently <60 mL/min signify possible Chronic Kidney Disease.    Anion gap 6 5 - 15  CBC     Status: Abnormal   Collection Time: 12/17/14  9:01 PM  Result Value Ref Range   WBC 12.2 (H) 3.6 - 11.0 K/uL   RBC 5.09 3.80 - 5.20 MIL/uL   Hemoglobin 9.6 (L) 12.0 - 16.0 g/dL   HCT 31.6 (L) 35.0 - 47.0 %   MCV 62.1 (L) 80.0 - 100.0 fL   MCH 18.8 (L) 26.0 - 34.0 pg    MCHC 30.3 (L) 32.0 - 36.0 g/dL   RDW 20.8 (H) 11.5 - 14.5 %   Platelets 466 (H) 150 - 440 K/uL  Urinalysis complete, with microscopic ([facility] only)     Status: Abnormal   Collection Time: 12/17/14  9:01 PM  Result Value Ref Range   Color, Urine YELLOW (A) YELLOW   APPearance HAZY (A) CLEAR   Glucose, UA NEGATIVE NEGATIVE mg/dL   Bilirubin Urine NEGATIVE NEGATIVE   Ketones, ur NEGATIVE NEGATIVE mg/dL   Specific Gravity, Urine 1.024 1.005 -  1.030   Hgb urine dipstick NEGATIVE NEGATIVE   pH 7.0 5.0 - 8.0   Protein, ur NEGATIVE NEGATIVE mg/dL   Nitrite NEGATIVE NEGATIVE   Leukocytes, UA 3+ (A) NEGATIVE   RBC / HPF 0-5 0 - 5 RBC/hpf   WBC, UA 6-30 0 - 5 WBC/hpf   Bacteria, UA RARE (A) NONE SEEN   Squamous Epithelial / LPF 6-30 (A) NONE SEEN   Mucous PRESENT   Pregnancy, urine POC     Status: None   Collection Time: 12/17/14  9:13 PM  Result Value Ref Range   Preg Test, Ur NEGATIVE NEGATIVE    Comment:        THE SENSITIVITY OF THIS METHODOLOGY IS >24 mIU/mL   POCT urinalysis dipstick     Status: Abnormal   Collection Time: 02/26/15 11:12 AM  Result Value Ref Range   Color, UA yellow    Clarity, UA dark    Glucose, UA negative    Bilirubin, UA negative    Ketones, UA negative    Spec Grav, UA 1.010    Blood, UA negative    pH, UA 7.5    Protein, UA trace    Urobilinogen, UA 0.2    Nitrite, UA negative    Leukocytes, UA moderate (2+) (A) Negative     Assessment & Plan  1. Obesity, Class III, BMI 40-49.9 (morbid obesity) ([place]) Has lost 5 lbs since last visit 1 month ago. The patient has been counseled on their higher than normal BMI.  They have verbally expressed understanding their increased risk for other diseases.  In efforts to meet a better target BMI goal the patient has been counseled on lifestyle, diet and exercise modification tactics. Start with moderate intensity aerobic exercise (walking, jogging, elliptical, swimming, group or individual sports, hiking) at least  34mins a day at least 4 days a week and increase intensity, duration, frequency as tolerated. Diet should include well balance fresh fruits and vegetables avoiding processed foods, carbohydrates and sugars. Drink at least 8oz 10 glasses a day avoiding sodas, sugary fruit drinks, sweetened tea. Check weight on a reliable scale daily and monitor weight loss progress daily. Consider investing in mobile phone apps that will help keep track of weight loss goals.  - CBC with Differential/Platelet - Comprehensive metabolic panel - Hemoglobin A1c - Lipid panel - TSH - phentermine (ADIPEX-P) 37.5 MG tablet; Take 1 tablet (37.5 mg total) by mouth daily before breakfast.  Dispense: 30 tablet; Refill: 0 - phentermine (ADIPEX-P) 37.5 MG tablet; Take 1 tablet (37.5 mg total) by mouth daily before breakfast.  Dispense: 30 tablet; Refill: 0  2. Bilateral low back pain without sciatica Will culture urine.  - POCT urinalysis dipstick - Urine Culture - CBC with Differential/Platelet - Comprehensive metabolic panel - Hemoglobin A1c - Lipid panel - TSH  3. Elevated glucose  - CBC with Differential/Platelet - Comprehensive metabolic panel - Hemoglobin A1c - Lipid panel - TSH  4. Other fatigue  - CBC with Differential/Platelet - Comprehensive metabolic panel - Hemoglobin A1c - Lipid panel - TSH

## 2015-02-28 LAB — URINE CULTURE

## 2015-04-27 ENCOUNTER — Ambulatory Visit: Payer: 59 | Admitting: Family Medicine

## 2015-05-01 ENCOUNTER — Ambulatory Visit: Payer: 59 | Admitting: Family Medicine

## 2015-05-28 ENCOUNTER — Ambulatory Visit: Payer: 59 | Admitting: Family Medicine

## 2015-08-01 ENCOUNTER — Ambulatory Visit (INDEPENDENT_AMBULATORY_CARE_PROVIDER_SITE_OTHER): Payer: 59 | Admitting: Family Medicine

## 2015-08-01 ENCOUNTER — Encounter: Payer: Self-pay | Admitting: Family Medicine

## 2015-08-01 VITALS — BP 124/72 | HR 84 | Temp 97.8°F | Resp 14 | Ht 62.0 in | Wt 279.0 lb

## 2015-08-01 DIAGNOSIS — D509 Iron deficiency anemia, unspecified: Secondary | ICD-10-CM

## 2015-08-01 DIAGNOSIS — R5383 Other fatigue: Secondary | ICD-10-CM

## 2015-08-01 DIAGNOSIS — R7309 Other abnormal glucose: Secondary | ICD-10-CM

## 2015-08-01 DIAGNOSIS — Z6841 Body Mass Index (BMI) 40.0 and over, adult: Secondary | ICD-10-CM

## 2015-08-01 DIAGNOSIS — E049 Nontoxic goiter, unspecified: Secondary | ICD-10-CM | POA: Diagnosis not present

## 2015-08-01 DIAGNOSIS — E010 Iodine-deficiency related diffuse (endemic) goiter: Secondary | ICD-10-CM

## 2015-08-01 DIAGNOSIS — L83 Acanthosis nigricans: Secondary | ICD-10-CM

## 2015-08-01 NOTE — Assessment & Plan Note (Addendum)
Check labs today; has had blood transfusions in 2003 and 2004; patient does not think she has been checked for abnormal Hgb, will check today; she does not think related to heavy periods; will check labs then consider referral to hematologist

## 2015-08-01 NOTE — Patient Instructions (Addendum)
Check out the information at [email or web address].org entitled "What It Takes to Lose Weight" Try to lose between 1-2 pounds per week by taking in fewer calories and burning off more calories You can succeed by limiting portions, limiting foods dense in calories and fat, becoming more active, and drinking 8 glasses of water a day (64 ounces) Don't skip meals, especially breakfast, as skipping meals may alter your metabolism Do not use over-the-counter weight loss pills or gimmicks that claim rapid weight loss A healthy BMI (or body mass index) is between 18.5 and 24.9 You can calculate your ideal BMI at the [place] website [employer]fr Let's get labs today Start the contrave AFTER we get your labs back If you have not heard anything from my staff by Friday about any orders/referrals/studies from today, please contact us here to follow-up [phone]

## 2015-08-01 NOTE — Assessment & Plan Note (Signed)
Check TSH, okay to start contrave; continue working with trainer; offered nutritional counseling, patient will declined for now but is always welcome to call if changes her mind; see AVS

## 2015-08-01 NOTE — Assessment & Plan Note (Signed)
Check A1c. 

## 2015-08-01 NOTE — Progress Notes (Signed)
BP 124/72 mmHg  Pulse 84  Temp(Src) 97.8 F (36.6 C) (Oral)  Resp 14  Ht 5\' 2"  (1.575 m)  Wt 279 lb (126.554 kg)  BMI 51.02 kg/m2  SpO2 98%  LMP 07/16/2015 (Approximate)   Subjective:    [name], female    DOB: 11/28/1985, 30 y.o.   MRN: [id number]  HPI: Sherri Robertson is a 30 y.o. female  Chief Complaint  Patient presents with  . Fatigue    very tired has had anemia in past  . Obesity    Wants to try weight loss contrave   She has had anemia, but thought it was from heavy periods; but patient says that couldn't be it; she might go 3 months in between cycles, then have a cycle; they put her on birth control pills to regulate it; she has never done the iron infusion; she doesn't take daily iron pills, burps it up; always tired; just walking makes her tired  Grandmother is anemic, but they don't know why; she has been anemic just as an older lady recently; she has not had anemia her whole life  No fam hx of thyroid trouble  She is at a plateau with her weight; she would like to try Contrave; she is interested in that; she has taken phentermine in the past which didn't work; made her heart beat fast; she would like to try to turn down the cravings  Depression screen [facility] 2/9 08/01/2015 01/26/2015  Decreased Interest 0 0  Down, Depressed, Hopeless 0 0  PHQ - 2 Score 0 0   Relevant past medical, surgical, family and social history reviewed and updated as indicated. Past Medical History  Diagnosis Date  . Anemia    Past Surgical History  Procedure Laterality Date  . Cesarean section     Family History  Problem Relation Age of Onset  . Hypertension Mother   . Depression Mother   . Hyperlipidemia Mother    Social History  Substance Use Topics  . Smoking status: Never Smoker   . Smokeless tobacco: None  . Alcohol Use: Yes     Comment: occassional   Interim medical history since our last visit reviewed. Allergies and medications reviewed and  updated.  Review of Systems Per HPI unless specifically indicated above     Objective:    BP 124/72 mmHg  Pulse 84  Temp(Src) 97.8 F (36.6 C) (Oral)  Resp 14  Ht 5\' 2"  (1.575 m)  Wt 279 lb (126.554 kg)  BMI 51.02 kg/m2  SpO2 98%  LMP 07/16/2015 (Approximate)  Wt Readings from Last 3 Encounters:  08/01/15 279 lb (126.554 kg)  02/26/15 265 lb 3.2 oz (120.294 kg)  01/26/15 269 lb 12.8 oz (122.38 kg)    Physical Exam  Constitutional: She appears well-developed and well-nourished. No distress.  Morbidly obese; weight gain 14 pounds over last 5 months  HENT:  Head: Normocephalic and atraumatic.  Eyes: EOM are normal. No scleral icterus.  Neck: Thyromegaly (versus adipose deposition) present.  Cardiovascular: Normal rate, regular rhythm and normal heart sounds.   No murmur heard. Pulmonary/Chest: Effort normal and breath sounds normal. No respiratory distress. She has no wheezes.  Abdominal: Soft. Bowel sounds are normal. She exhibits no distension.  Musculoskeletal: Normal range of motion. She exhibits no edema.  Neurological: She is alert. She exhibits normal muscle tone.  Skin: Skin is warm and dry. She is not diaphoretic. No pallor.  Hyperpigmentation on nape of neck, sparing  of deep creases; consistent with acanthosis nigricans  Psychiatric: She has a normal mood and affect. Her behavior is normal. Judgment and thought content normal.      Assessment & Plan:   Problem List Items Addressed This Visit      Endocrine   Thyromegaly   Relevant Orders   US Soft Tissue Head/Neck     Musculoskeletal and Integument   Acanthosis nigricans    Explained insulin resistance, so important to lose weight to lessen chance of progression to diabetes        Other   Anemia, iron deficiency    Check labs today; has had blood transfusions in 2003 and 2004; patient does not think she has been checked for abnormal Hgb, will check today; she does not think related to heavy periods;  will check labs then consider referral to hematologist      Relevant Orders   Hemoglobinopathy evaluation (Completed)   CBC with Differential/Platelet (Completed)   Ferritin (Completed)   IBC panel   Vitamin B12 (Completed)   Folate (Completed)   Morbid obesity with BMI of 50.0-59.9, adult ([place]) - Primary    Check TSH, okay to start contrave; continue working with trainer; offered nutritional counseling, patient will declined for now but is always welcome to call if changes her mind; see AVS      Relevant Orders   TSH (Completed)   Elevated glucose    Check A1c      Relevant Orders   Hgb A1c w/o eAG (Completed)   Other fatigue   Relevant Orders   VITAMIN D 25 Hydroxy (Vit-D Deficiency, Fractures) (Completed)   Comprehensive metabolic panel (Completed)      Follow up plan: Return 4-6 weeks, for weight management and fatigue.  An after-visit summary was printed and given to the patient at [place].  Please see the patient instructions which may contain other information and recommendations beyond what is mentioned above in the assessment and plan.  Meds ordered this encounter  Medications  . cetirizine (ZYRTEC) 10 MG tablet    Sig: Take 10 mg by mouth daily.   Orders Placed This Encounter  Procedures  . US Soft Tissue Head/Neck  . Hemoglobinopathy evaluation  . CBC with Differential/Platelet  . Hgb A1c w/o eAG  . Ferritin  . IBC panel  . Vitamin B12  . Folate  . TSH  . VITAMIN D 25 Hydroxy (Vit-D Deficiency, Fractures)  . Comprehensive metabolic panel  . Iron and TIBC

## 2015-08-02 ENCOUNTER — Telehealth: Payer: Self-pay

## 2015-08-02 ENCOUNTER — Telehealth: Payer: Self-pay | Admitting: Family Medicine

## 2015-08-02 DIAGNOSIS — R7309 Other abnormal glucose: Secondary | ICD-10-CM | POA: Insufficient documentation

## 2015-08-02 MED ORDER — LIRAGLUTIDE -WEIGHT MANAGEMENT 18 MG/3ML ~~LOC~~ SOPN: 0.6000 mg | PEN_INJECTOR | Freq: Every day | SUBCUTANEOUS | Status: DC

## 2015-08-02 MED ORDER — LORCASERIN HCL 10 MG PO TABS: 1.0000 | ORAL_TABLET | Freq: Two times a day (BID) | ORAL | Status: DC

## 2015-08-02 MED ORDER — VITAMIN D (ERGOCALCIFEROL) 1.25 MG (50000 UNIT) PO CAPS: 50000.0000 [IU] | ORAL_CAPSULE | ORAL | Status: DC

## 2015-08-02 NOTE — Telephone Encounter (Signed)
I talked w/patient about labs Significant microcytic, hypochromic indices; awaiting Hbg profile Single A1c 6.5, but normal glucose; I don't diagnose anyone based on just one blood test; will get 2nd A1c in 3 months Discussed low vit D; start Rx, then will do OTC 1000 iu daily Discussed weight loss med; will use Saxenda, taper up; no fam hx of MEN-2; discussed risk of medullary thyroid cancer She'll start injections and see me in 4 weeks

## 2015-08-02 NOTE — Telephone Encounter (Signed)
Patient called states her ins will not cover saxenda.  She says that yall were talking about another medication if her ins. Did not approve this one?  Please send to pharmacy.

## 2015-08-02 NOTE — Telephone Encounter (Signed)
I'll prescribe Belviq; very important to not get pregnant while on this medicine If any mood problems (depression, etc), then stop it and call us or seek care right away

## 2015-08-03 LAB — CBC WITH DIFFERENTIAL/PLATELET
BASOS: 0 %
Basophils Absolute: 0 10*3/uL (ref 0.0–0.2)
EOS (ABSOLUTE): 0.1 10*3/uL (ref 0.0–0.4)
EOS: 1 %
HEMATOCRIT: 31.9 % — AB (ref 34.0–46.6)
Hemoglobin: 9.7 g/dL — ABNORMAL LOW (ref 11.1–15.9)
Immature Grans (Abs): 0 10*3/uL (ref 0.0–0.1)
Immature Granulocytes: 0 %
LYMPHS ABS: 3.4 10*3/uL — AB (ref 0.7–3.1)
Lymphs: 36 %
MCH: 19.1 pg — AB (ref 26.6–33.0)
MCHC: 30.4 g/dL — AB (ref 31.5–35.7)
MCV: 63 fL — AB (ref 79–97)
MONOS ABS: 1 10*3/uL — AB (ref 0.1–0.9)
Monocytes: 11 %
NEUTROS ABS: 4.8 10*3/uL (ref 1.4–7.0)
NEUTROS PCT: 52 %
PLATELETS: 435 10*3/uL — AB (ref 150–379)
RBC: 5.09 x10E6/uL (ref 3.77–5.28)
RDW: 18.8 % — AB (ref 12.3–15.4)
WBC: 9.4 10*3/uL (ref 3.4–10.8)

## 2015-08-03 LAB — COMPREHENSIVE METABOLIC PANEL
ALBUMIN: 4 g/dL (ref 3.5–5.5)
ALT: 5 IU/L (ref 0–32)
AST: 14 IU/L (ref 0–40)
Albumin/Globulin Ratio: 1.1 — ABNORMAL LOW (ref 1.2–2.2)
Alkaline Phosphatase: 92 IU/L (ref 39–117)
BUN / CREAT RATIO: 15 (ref 9–23)
BUN: 10 mg/dL (ref 6–20)
CHLORIDE: 102 mmol/L (ref 96–106)
CO2: 24 mmol/L (ref 18–29)
CREATININE: 0.68 mg/dL (ref 0.57–1.00)
Calcium: 9.2 mg/dL (ref 8.7–10.2)
GFR calc non Af Amer: 118 mL/min/{1.73_m2} (ref >59)
GFR, EST AFRICAN AMERICAN: 136 mL/min/{1.73_m2} (ref >59)
GLUCOSE: 84 mg/dL (ref 65–99)
Globulin, Total: 3.7 g/dL (ref 1.5–4.5)
Potassium: 4.4 mmol/L (ref 3.5–5.2)
Sodium: 141 mmol/L (ref 134–144)
TOTAL PROTEIN: 7.7 g/dL (ref 6.0–8.5)

## 2015-08-03 LAB — FOLATE: Folate: 10.5 ng/mL (ref >3.0)

## 2015-08-03 LAB — TSH: TSH: 3.23 u[IU]/mL (ref 0.450–4.500)

## 2015-08-03 LAB — HEMOGLOBINOPATHY EVALUATION
HEMOGLOBIN A2 QUANTITATION: 1.4 % (ref 0.7–3.1)
HEMOGLOBIN F QUANTITATION: 0 % (ref 0.0–2.0)
HGB A: 98.6 % — AB (ref 94.0–98.0)
HGB C: 0 %
HGB S: 0 %

## 2015-08-03 LAB — IRON AND TIBC
IRON SATURATION: 6 % — AB (ref 15–55)
IRON: 17 ug/dL — AB (ref 27–159)
TIBC: 298 ug/dL (ref 250–450)
UIBC: 281 ug/dL (ref 131–425)

## 2015-08-03 LAB — VITAMIN B12: Vitamin B-12: 785 pg/mL (ref 211–946)

## 2015-08-03 LAB — HGB A1C W/O EAG: Hgb A1c MFr Bld: 6.5 % — ABNORMAL HIGH (ref 4.8–5.6)

## 2015-08-03 LAB — FERRITIN: Ferritin: 34 ng/mL (ref 15–150)

## 2015-08-03 LAB — VITAMIN D 25 HYDROXY (VIT D DEFICIENCY, FRACTURES): VIT D 25 HYDROXY: 11.7 ng/mL — AB (ref 30.0–100.0)

## 2015-08-03 NOTE — Telephone Encounter (Signed)
Pt.notified

## 2015-08-05 ENCOUNTER — Telehealth: Payer: Self-pay | Admitting: Family Medicine

## 2015-08-05 DIAGNOSIS — L83 Acanthosis nigricans: Secondary | ICD-10-CM | POA: Insufficient documentation

## 2015-08-05 DIAGNOSIS — D509 Iron deficiency anemia, unspecified: Secondary | ICD-10-CM

## 2015-08-05 DIAGNOSIS — E010 Iodine-deficiency related diffuse (endemic) goiter: Secondary | ICD-10-CM

## 2015-08-05 NOTE — Telephone Encounter (Signed)
Please let pt know that her test for unusual hemoglobin came back negative; she has normal hemoglobin; I'd like her to see a hematologist about her anemia; referral entered; thank you

## 2015-08-05 NOTE — Assessment & Plan Note (Signed)
Explained insulin resistance, so important to lose weight to lessen chance of progression to diabetes

## 2015-08-05 NOTE — Assessment & Plan Note (Signed)
Refer to hematologist °

## 2015-08-06 NOTE — Telephone Encounter (Signed)
Pt.notified

## 2015-08-08 ENCOUNTER — Ambulatory Visit
Admission: RE | Admit: 2015-08-08 | Discharge: 2015-08-08 | Disposition: A | Discharge status: Home / Self Care | Payer: 59 | Source: Ambulatory Visit | Attending: Family Medicine | Admitting: Family Medicine

## 2015-08-08 DIAGNOSIS — E049 Nontoxic goiter, unspecified: Secondary | ICD-10-CM | POA: Insufficient documentation

## 2015-09-06 ENCOUNTER — Ambulatory Visit: Payer: 59 | Admitting: Family Medicine

## 2015-09-21 ENCOUNTER — Ambulatory Visit: Payer: 59 | Admitting: Family Medicine

## 2015-09-25 ENCOUNTER — Ambulatory Visit: Payer: 59 | Admitting: Internal Medicine

## 2015-09-25 ENCOUNTER — Telehealth: Payer: Self-pay | Admitting: Family Medicine

## 2015-09-25 NOTE — Telephone Encounter (Signed)
Left voice mail

## 2015-09-25 NOTE — Telephone Encounter (Signed)
Thank you for the update Please call patient and let her know how important we think it is for her to get in to see the hematologist about her anemia Please urge her to call and schedule appt right away

## 2015-09-25 NOTE — Telephone Encounter (Signed)
Lattie Haw from the [facility] states she called patient about her appointment and patient told her she forgot. Lattie Haw tried to reschedule the appointment while the patient was on the other line but patient refused. Said she was at work and that she will have to call back. Lattie Haw was just wanting to let you know.

## 2015-09-26 ENCOUNTER — Telehealth: Payer: Self-pay | Admitting: *Deleted

## 2015-09-26 NOTE — Telephone Encounter (Signed)
[facility]cd msg from [facility] scheduling. Pt did not keep apt with Dr. Rogue Bussing on 09/25/15. Pt offered to r/s this apt-declined. RN reviewed chart-pcp is aware per chart.

## 2015-09-26 NOTE — Telephone Encounter (Signed)
-----   Message from Cephus Richer sent at 09/25/2015  3:22 PM EDT ----- Per pt states she was at work forgot about appt. Will call when she wants to r/s. lrt

## 2015-10-23 ENCOUNTER — Inpatient Hospital Stay: Payer: 59 | Attending: Internal Medicine | Admitting: Internal Medicine

## 2015-12-29 ENCOUNTER — Emergency Department: Payer: Self-pay

## 2015-12-29 ENCOUNTER — Emergency Department
Admission: EM | Admit: 2015-12-29 | Discharge: 2015-12-29 | Disposition: A | Discharge status: Home / Self Care | Payer: Self-pay | Attending: Emergency Medicine | Admitting: Emergency Medicine

## 2015-12-29 ENCOUNTER — Encounter: Payer: Self-pay | Admitting: Emergency Medicine

## 2015-12-29 DIAGNOSIS — M79604 Pain in right leg: Secondary | ICD-10-CM | POA: Insufficient documentation

## 2015-12-29 DIAGNOSIS — R0789 Other chest pain: Secondary | ICD-10-CM | POA: Insufficient documentation

## 2015-12-29 LAB — BASIC METABOLIC PANEL
ANION GAP: 4 — AB (ref 5–15)
BUN: 8 mg/dL (ref 6–20)
CALCIUM: 8.6 mg/dL — AB (ref 8.9–10.3)
CO2: 26 mmol/L (ref 22–32)
CREATININE: 0.68 mg/dL (ref 0.44–1.00)
Chloride: 106 mmol/L (ref 101–111)
GFR calc non Af Amer: >60 mL/min (ref >60)
GLUCOSE: 98 mg/dL (ref 65–99)
Potassium: 3.8 mmol/L (ref 3.5–5.1)
Sodium: 136 mmol/L (ref 135–145)

## 2015-12-29 LAB — CBC WITH DIFFERENTIAL/PLATELET
BASOS ABS: 0 10*3/uL (ref 0–0.1)
BASOS PCT: 0 %
EOS ABS: 0.1 10*3/uL (ref 0–0.7)
Eosinophils Relative: 1 %
HCT: 33.5 % — ABNORMAL LOW (ref 35.0–47.0)
HEMOGLOBIN: 10.5 g/dL — AB (ref 12.0–16.0)
Lymphocytes Relative: 34 %
Lymphs Abs: 3 10*3/uL (ref 1.0–3.6)
MCH: 19.9 pg — ABNORMAL LOW (ref 26.0–34.0)
MCHC: 31.4 g/dL — ABNORMAL LOW (ref 32.0–36.0)
MCV: 63.2 fL — ABNORMAL LOW (ref 80.0–100.0)
Monocytes Absolute: 0.6 10*3/uL (ref 0.2–0.9)
Monocytes Relative: 7 %
NEUTROS PCT: 58 %
Neutro Abs: 5.1 10*3/uL (ref 1.4–6.5)
Platelets: 452 10*3/uL — ABNORMAL HIGH (ref 150–440)
RBC: 5.3 MIL/uL — AB (ref 3.80–5.20)
RDW: 19.9 % — ABNORMAL HIGH (ref 11.5–14.5)
WBC: 8.8 10*3/uL (ref 3.6–11.0)

## 2015-12-29 LAB — FIBRIN DERIVATIVES D-DIMER (ARMC ONLY): FIBRIN DERIVATIVES D-DIMER (ARMC): 504 — AB (ref 0–499)

## 2015-12-29 LAB — POCT PREGNANCY, URINE: PREG TEST UR: NEGATIVE

## 2015-12-29 MED ORDER — KETOROLAC TROMETHAMINE 30 MG/ML IJ SOLN
15.0000 mg | INTRAMUSCULAR | Status: AC
  Administered 2015-12-29: 15 mg via INTRAVENOUS
  Filled 2015-12-29: qty 1

## 2015-12-29 MED ORDER — IOPAMIDOL (ISOVUE-370) INJECTION 76%
75.0000 mL | Freq: Once | INTRAVENOUS | Status: AC | PRN
  Administered 2015-12-29: 75 mL via INTRAVENOUS

## 2015-12-29 MED ORDER — SODIUM CHLORIDE 0.9 % IV BOLUS (SEPSIS)
1000.0000 mL | Freq: Once | INTRAVENOUS | Status: AC
  Administered 2015-12-29: 1000 mL via INTRAVENOUS

## 2015-12-29 MED ORDER — NAPROXEN 500 MG PO TABS: 500.0000 mg | ORAL_TABLET | Freq: Two times a day (BID) | ORAL | 0 refills | Status: DC

## 2015-12-29 NOTE — ED Notes (Signed)
See triage note. Pt c/o R leg swelling 6 days ago, improved with compression stockings and elevating extremity. Developed intermittent back spasms associated with shortness of breath starting yesterday.

## 2015-12-29 NOTE — ED Notes (Signed)
Pt back from us

## 2015-12-29 NOTE — ED Triage Notes (Signed)
States R leg edema x 6 days, began feeling SOB yesterday, back pain began yesterday.

## 2015-12-29 NOTE — ED Provider Notes (Signed)
[facility] Emergency Department Provider Note  ____________________________________________  Time seen: Approximately 8:35 AM  I have reviewed the triage vital signs and the nursing notes.   HISTORY  Chief Complaint Shortness of Breath    HPI Sherri Robertson is a 30 y.o. female complains of intermittent upper back pain described as spasms. Nonradiating, lasts for a few seconds at a time. Moderate to severe in intensity. No aggravating or alleviating factors. Occurred last night while she was at rest, no inciting trauma such as slips trips or falls. No recent travel trauma hospitalizations or surgeries that she can pinpoint. No history of DVT or PE. No family history of such.  When the pain comes on its associated with shortness of breath. She also feels like she has been having some right lower extremity pain and swelling intermittently recently as well.     Past Medical History:  Diagnosis Date  . Anemia      Patient Active Problem List   Diagnosis Date Noted  . Acanthosis nigricans 08/05/2015  . Thyromegaly 08/05/2015  . Elevated hemoglobin A1c 08/02/2015  . Abnormal cervical Pap smear with positive HPV DNA test 01/26/2015  . Anxiety and depression 01/26/2015  . Dysmenorrhea 01/26/2015  . LBP (low back pain) 01/26/2015  . Fibroid 01/26/2015  . Elevated glucose 01/26/2015  . Other fatigue 01/26/2015  . Anemia, iron deficiency 09/21/2009  . Morbid obesity with BMI of 50.0-59.9, adult ([place]) 07/27/2009     Past Surgical History:  Procedure Laterality Date  . CESAREAN SECTION       Prior to Admission medications   Medication Sig Start Date End Date Taking? Authorizing Provider  cetirizine (ZYRTEC) 10 MG tablet Take 10 mg by mouth daily.    Historical Provider, MD  Lorcaserin HCl 10 MG TABS Take 1 tablet by mouth 2 (two) times daily. Very important to not get pregnant while on this medicine 08/02/15   Arnetha Courser, MD  naproxen  (NAPROSYN) 500 MG tablet Take 1 tablet (500 mg total) by mouth 2 (two) times daily with a meal. 12/29/15   Carrie Mew, MD  Vitamin D, Ergocalciferol, (DRISDOL) 50000 units CAPS capsule Take 1 capsule (50,000 Units total) by mouth every 7 (seven) days. 08/02/15   Arnetha Courser, MD     Allergies Review of patient's allergies indicates no known allergies.   Family History  Problem Relation Age of Onset  . Hypertension Mother   . Depression Mother   . Hyperlipidemia Mother     Social History Social History  Substance Use Topics  . Smoking status: Never Smoker  . Smokeless tobacco: Not on file  . Alcohol use Yes     Comment: occassional    Review of Systems  Constitutional:   No fever or chills.  ENT:   No sore throat. No rhinorrhea. Cardiovascular:   No chest pain. Respiratory:   Positive shortness of breath intermittently without cough. Gastrointestinal:   Negative for abdominal pain, vomiting and diarrhea.   Musculoskeletal:   Positive upper back pain intermittently 10-point ROS otherwise negative.  ____________________________________________   PHYSICAL EXAM:  VITAL SIGNS: ED Triage Vitals  Enc Vitals Group     BP 12/29/15 0751 (!) 108/45     Pulse Rate 12/29/15 0751 87     Resp 12/29/15 0751 18     Temp 12/29/15 0751 97.9 F (36.6 C)     Temp Source 12/29/15 0751 Oral     SpO2 12/29/15 0751 99 %  Weight 12/29/15 0753 270 lb (122.5 kg)     Height 12/29/15 0753 5\' 2"  (1.575 m)     Head Circumference --      Peak Flow --      Pain Score 12/29/15 0753 8     Pain Loc --      Pain Edu? --      Excl. in [place]? --     Vital signs reviewed, nursing assessments reviewed.   Constitutional:   Alert and oriented. Well appearing and in no distress. Eyes:   No scleral icterus. No conjunctival pallor. PERRL. EOMI.  No nystagmus. ENT   Head:   Normocephalic and atraumatic.   Nose:   No congestion/rhinnorhea. No septal hematoma   Mouth/Throat:   MMM,  no pharyngeal erythema. No peritonsillar mass.    Neck:   No stridor. No SubQ emphysema. No meningismus. Hematological/Lymphatic/Immunilogical:   No cervical lymphadenopathy. Cardiovascular:   RRR. Symmetric bilateral radial and DP pulses.  No murmurs.  Respiratory:   Normal respiratory effort without tachypnea nor retractions. Breath sounds are clear and equal bilaterally. No wheezes/rales/rhonchi.Upper back pain nonreproducible with palpation Gastrointestinal:   Soft and nontender. Non distended. There is no CVA tenderness.  No rebound, rigidity, or guarding. Genitourinary:   deferred Musculoskeletal:   Nontender with normal range of motion in all extremities. No joint effusions.  No lower extremity tenderness.  No edema. Lower extremities symmetric. Negative Homans sign, no palpable cord Neurologic:   Normal speech and language.  CN 2-10 normal. Motor grossly intact. No gross focal neurologic deficits are appreciated.  Skin:    Skin is warm, dry and intact. No rash noted.  No petechiae, purpura, or bullae.  ____________________________________________    LABS (pertinent positives/negatives) (all labs ordered are listed, but only abnormal results are displayed) Labs Reviewed  BASIC METABOLIC PANEL - Abnormal; Notable for the following:       Result Value   Calcium 8.6 (*)    Anion gap 4 (*)    All other components within normal limits  CBC WITH DIFFERENTIAL/PLATELET - Abnormal; Notable for the following:    RBC 5.30 (*)    Hemoglobin 10.5 (*)    HCT 33.5 (*)    MCV 63.2 (*)    MCH 19.9 (*)    MCHC 31.4 (*)    RDW 19.9 (*)    Platelets 452 (*)    All other components within normal limits  FIBRIN DERIVATIVES D-DIMER ([facility] ONLY) - Abnormal; Notable for the following:    Fibrin derivatives D-dimer [facility]) 504 (*)    All other components within normal limits  POC URINE PREG, ED  POCT PREGNANCY, URINE   ____________________________________________   EKG  Interpreted by  me  Date: 12/29/2015  Rate: 67  Rhythm: normal sinus rhythm  QRS Axis: normal  Intervals: normal  ST/T Wave abnormalities: normal  Conduction Disutrbances: none  Narrative Interpretation: unremarkable, no evidence of right heart strain      ____________________________________________    RADIOLOGY  Chest x-ray unremarkable CT angiogram chest negative for PE otherwise unremarkable Ultrasound right lower extremity unremarkable  ____________________________________________   PROCEDURES Procedures  ____________________________________________   INITIAL IMPRESSION / ASSESSMENT AND PLAN / ED COURSE  Pertinent labs & imaging results that were available during my care of the patient were reviewed by me and considered in my medical decision making (see chart for details).  Patient presents with a typical upper back pain associated with shortness of breath.Considering the patient's symptoms, medical history, and  physical examination today, I have low suspicion for ACS, PE, TAD, pneumothorax, carditis, mediastinitis, pneumonia, CHF, or sepsis.  Will check labs chest x-ray and d-dimer for screening. The results are all unremarkable, patient should be medically stable for outpatient follow-up with primary care. Toradol for symptom relief for now.   ----------------------------------------- 1:38 PM on 12/29/2015 -----------------------------------------  Workup revealed slightly elevated d-dimer at 504. Proceeded with CT angiogram of the chest and ultrasound which are both unremarkable. Pain controlled with NSAIDs in the ED. Vital signs stable. Considering the patient's symptoms, medical history, and physical examination today, I have low suspicion for ACS, PE, TAD, pneumothorax, carditis, mediastinitis, pneumonia, CHF, or sepsis. Low suspicion for an occult DVT. Continue NSAIDs, follow up with primary care.  Patient counseled on return precautions. All questions and concerns  distress. Will follow up with primary care.     Clinical Course   ____________________________________________   FINAL CLINICAL IMPRESSION(S) / ED DIAGNOSES  Final diagnoses:  Right leg pain  Atypical chest pain       Portions of this note were generated with dragon dictation software. Dictation errors may occur despite best attempts at proofreading.    Carrie Mew, MD 12/29/15 [phone]

## 2015-12-30 ENCOUNTER — Telehealth: Payer: Self-pay | Admitting: Family Medicine

## 2015-12-30 NOTE — Telephone Encounter (Signed)
I saw patient in April, but she never followed up with me I referred her to hematologist, but she no showed Please call patient and ask her to make an ER f/u appt with me so we can discuss her abnormal labs and get her care

## 2015-12-31 ENCOUNTER — Ambulatory Visit: Payer: 59 | Admitting: Family Medicine

## 2016-02-20 ENCOUNTER — Emergency Department
Admission: EM | Admit: 2016-02-20 | Discharge: 2016-02-20 | Disposition: A | Discharge status: Home / Self Care | Payer: 59 | Attending: Emergency Medicine | Admitting: Emergency Medicine

## 2016-02-20 ENCOUNTER — Emergency Department: Payer: 59

## 2016-02-20 DIAGNOSIS — Z79899 Other long term (current) drug therapy: Secondary | ICD-10-CM | POA: Insufficient documentation

## 2016-02-20 DIAGNOSIS — Z791 Long term (current) use of non-steroidal anti-inflammatories (NSAID): Secondary | ICD-10-CM | POA: Insufficient documentation

## 2016-02-20 DIAGNOSIS — H539 Unspecified visual disturbance: Secondary | ICD-10-CM | POA: Diagnosis not present

## 2016-02-20 DIAGNOSIS — R42 Dizziness and giddiness: Secondary | ICD-10-CM | POA: Diagnosis not present

## 2016-02-20 DIAGNOSIS — Z5181 Encounter for therapeutic drug level monitoring: Secondary | ICD-10-CM | POA: Insufficient documentation

## 2016-02-20 DIAGNOSIS — G4459 Other complicated headache syndrome: Secondary | ICD-10-CM

## 2016-02-20 LAB — PROTIME-INR
INR: 0.98
PROTHROMBIN TIME: 13 s (ref 11.4–15.2)

## 2016-02-20 LAB — DIFFERENTIAL
BASOS ABS: 0.1 10*3/uL (ref 0–0.1)
Basophils Relative: 1 %
EOS PCT: 1 %
Eosinophils Absolute: 0.1 10*3/uL (ref 0–0.7)
LYMPHS ABS: 4 10*3/uL — AB (ref 1.0–3.6)
LYMPHS PCT: 38 %
Monocytes Absolute: 0.9 10*3/uL (ref 0.2–0.9)
Monocytes Relative: 8 %
NEUTROS PCT: 52 %
Neutro Abs: 5.6 10*3/uL (ref 1.4–6.5)

## 2016-02-20 LAB — APTT: APTT: 40 s — AB (ref 24–36)

## 2016-02-20 LAB — CBC
HCT: 32.8 % — ABNORMAL LOW (ref 35.0–47.0)
HEMOGLOBIN: 10.2 g/dL — AB (ref 12.0–16.0)
MCH: 19.6 pg — AB (ref 26.0–34.0)
MCHC: 31.2 g/dL — ABNORMAL LOW (ref 32.0–36.0)
MCV: 62.8 fL — ABNORMAL LOW (ref 80.0–100.0)
PLATELETS: 440 10*3/uL (ref 150–440)
RBC: 5.23 MIL/uL — AB (ref 3.80–5.20)
RDW: 20.2 % — ABNORMAL HIGH (ref 11.5–14.5)
WBC: 10.7 10*3/uL (ref 3.6–11.0)

## 2016-02-20 LAB — COMPREHENSIVE METABOLIC PANEL
ALBUMIN: 3.7 g/dL (ref 3.5–5.0)
ALK PHOS: 84 U/L (ref 38–126)
ALT: 10 U/L — ABNORMAL LOW (ref 14–54)
ANION GAP: 6 (ref 5–15)
AST: 18 U/L (ref 15–41)
BUN: 9 mg/dL (ref 6–20)
CALCIUM: 8.9 mg/dL (ref 8.9–10.3)
CO2: 26 mmol/L (ref 22–32)
Chloride: 105 mmol/L (ref 101–111)
Creatinine, Ser: 0.59 mg/dL (ref 0.44–1.00)
GFR calc Af Amer: >60 mL/min (ref >60)
GFR calc non Af Amer: >60 mL/min (ref >60)
GLUCOSE: 101 mg/dL — AB (ref 65–99)
POTASSIUM: 4 mmol/L (ref 3.5–5.1)
SODIUM: 137 mmol/L (ref 135–145)
Total Bilirubin: 0.3 mg/dL (ref 0.3–1.2)
Total Protein: 8.1 g/dL (ref 6.5–8.1)

## 2016-02-20 LAB — TROPONIN I: Troponin I: <0.03 ng/mL (ref <0.03)

## 2016-02-20 LAB — GLUCOSE, CAPILLARY: Glucose-Capillary: 88 mg/dL (ref 65–99)

## 2016-02-20 MED ORDER — DIPHENHYDRAMINE HCL 50 MG/ML IJ SOLN
25.0000 mg | Freq: Once | INTRAMUSCULAR | Status: AC
  Administered 2016-02-20: 25 mg via INTRAVENOUS
  Filled 2016-02-20: qty 1

## 2016-02-20 MED ORDER — NAPROXEN 500 MG PO TABS: 500.0000 mg | ORAL_TABLET | Freq: Two times a day (BID) | ORAL | 0 refills | Status: DC

## 2016-02-20 MED ORDER — KETOROLAC TROMETHAMINE 30 MG/ML IJ SOLN
15.0000 mg | INTRAMUSCULAR | Status: AC
  Administered 2016-02-20: 15 mg via INTRAVENOUS
  Filled 2016-02-20: qty 1

## 2016-02-20 MED ORDER — METOCLOPRAMIDE HCL 10 MG PO TABS: 10.0000 mg | ORAL_TABLET | Freq: Four times a day (QID) | ORAL | 0 refills | Status: DC | PRN

## 2016-02-20 MED ORDER — METHYLPREDNISOLONE SODIUM SUCC 125 MG IJ SOLR
125.0000 mg | Freq: Once | INTRAMUSCULAR | Status: AC
  Administered 2016-02-20: 125 mg via INTRAVENOUS
  Filled 2016-02-20: qty 2

## 2016-02-20 MED ORDER — METOCLOPRAMIDE HCL 5 MG/ML IJ SOLN
10.0000 mg | Freq: Once | INTRAMUSCULAR | Status: AC
Start: 2016-02-20 — End: 2016-02-20
  Administered 2016-02-20: 10 mg via INTRAVENOUS
  Filled 2016-02-20: qty 2

## 2016-02-20 NOTE — Progress Notes (Signed)
CH was responding to a page to visit with a Pt in ED Rm 15, who came in with headache and blurry vision. [name] visited the Pt. Pt was alert and cognizant. [name] asked Pt if she wished to notify her family that she was in hospital, Pt declined. Pt told CH, she told her mother and sister that she is in the hospital but did not want them to come. CH told Pt, he was available in case she needed him, and then left.     02/20/16 1400  Clinical Encounter Type  Visited With Patient  Visit Type Initial;Code;ED  Referral From Nurse  Consult/Referral To Chaplain  Spiritual Encounters  Spiritual Needs Prayer;Other (Comment)  Stress Factors  Patient Stress Factors Exhausted  Family Stress Factors None identified

## 2016-02-20 NOTE — ED Notes (Signed)
Neurologist at bedside. 

## 2016-02-20 NOTE — ED Provider Notes (Addendum)
[facility] Emergency Department Provider Note  ____________________________________________  Time seen: Approximately 1:52 PM  I have reviewed the triage vital signs and the nursing notes.   HISTORY  Chief Complaint Blurred Vision    HPI Sherri Robertson is a 30 y.o. female who complains of right-sided headache that started at 12:40 PM and this was associated with blurriness in her left eye. Also reports some dizziness but no motor weakness or change in sensation. Never had anything like this before. He was at work using her computer. Reports stress at work recently. No trauma. No vomiting fevers chills or neck pain.     Past Medical History:  Diagnosis Date  . Anemia      Patient Active Problem List   Diagnosis Date Noted  . Acanthosis nigricans 08/05/2015  . Thyromegaly 08/05/2015  . Elevated hemoglobin A1c 08/02/2015  . Abnormal cervical Pap smear with positive HPV DNA test 01/26/2015  . Anxiety and depression 01/26/2015  . Dysmenorrhea 01/26/2015  . LBP (low back pain) 01/26/2015  . Fibroid 01/26/2015  . Elevated glucose 01/26/2015  . Other fatigue 01/26/2015  . Anemia, iron deficiency 09/21/2009  . Morbid obesity with BMI of 50.0-59.9, adult ([place]) 07/27/2009     Past Surgical History:  Procedure Laterality Date  . CESAREAN SECTION       Prior to Admission medications   Medication Sig Start Date End Date Taking? Authorizing Provider  acetaminophen (TYLENOL) 325 MG tablet Take 325 mg by mouth every 6 (six) hours as needed.   Yes Historical Provider, MD  bismuth subsalicylate (PEPTO BISMOL) 262 MG/15ML suspension Take 30 mLs by mouth every 6 (six) hours as needed.   Yes Historical Provider, MD  ibuprofen (ADVIL,MOTRIN) 200 MG tablet Take 400 mg by mouth every 6 (six) hours as needed.   Yes Historical Provider, MD  Lorcaserin HCl 10 MG TABS Take 1 tablet by mouth 2 (two) times daily. Very important to not get pregnant while on this  medicine Patient not taking: Reported on 02/20/2016 08/02/15   Arnetha Courser, MD  metoCLOPramide (REGLAN) 10 MG tablet Take 1 tablet (10 mg total) by mouth every 6 (six) hours as needed. 02/20/16   Carrie Mew, MD  naproxen (NAPROSYN) 500 MG tablet Take 1 tablet (500 mg total) by mouth 2 (two) times daily with a meal. 02/20/16   Carrie Mew, MD  Vitamin D, Ergocalciferol, (DRISDOL) 50000 units CAPS capsule Take 1 capsule (50,000 Units total) by mouth every 7 (seven) days. Patient not taking: Reported on 02/20/2016 08/02/15   Arnetha Courser, MD     Allergies Patient has no known allergies.   Family History  Problem Relation Age of Onset  . Hypertension Mother   . Depression Mother   . Hyperlipidemia Mother     Social History Social History  Substance Use Topics  . Smoking status: Never Smoker  . Smokeless tobacco: Never Used  . Alcohol use Yes     Comment: occassional    Review of Systems  Constitutional:   No fever or chills.  ENT:   No sore throat. No rhinorrhea. Cardiovascular:   No chest pain. Respiratory:   No dyspnea or cough. Gastrointestinal:   Negative for abdominal pain, vomiting and diarrhea.   Neurological:   Positive as above for headaches 10-point ROS otherwise negative.  ____________________________________________   PHYSICAL EXAM:  VITAL SIGNS: ED Triage Vitals  Enc Vitals Group     BP 02/20/16 1317 108/82     Pulse  Rate 02/20/16 1317 73     Resp 02/20/16 1317 17     Temp 02/20/16 1317 98.2 F (36.8 C)     Temp Source 02/20/16 1317 Oral     SpO2 02/20/16 1317 100 %     Weight --      Height --      Head Circumference --      Peak Flow --      Pain Score 02/20/16 1332 7     Pain Loc --      Pain Edu? --      Excl. in [place]? --     Vital signs reviewed, nursing assessments reviewed.   Constitutional:   Alert and oriented. Well appearing and in no distress. Eyes:   No scleral icterus. No conjunctival pallor. PERRL. EOMI.  No  nystagmus. ENT   Head:   Normocephalic and atraumatic.   Nose:   No congestion/rhinnorhea. No septal hematoma   Mouth/Throat:   MMM, no pharyngeal erythema. No peritonsillar mass.    Neck:   No stridor. No SubQ emphysema. No meningismus. Hematological/Lymphatic/Immunilogical:   No cervical lymphadenopathy. Cardiovascular:   RRR. Symmetric bilateral radial and DP pulses.  No murmurs.  Respiratory:   Normal respiratory effort without tachypnea nor retractions. Breath sounds are clear and equal bilaterally. No wheezes/rales/rhonchi. Gastrointestinal:   Soft and nontender. Non distended. There is no CVA tenderness.  No rebound, rigidity, or guarding. Genitourinary:   deferred Musculoskeletal:   Nontender with normal range of motion in all extremities. No joint effusions.  No lower extremity tenderness.  No edema. Neurologic:   Normal speech and language.  NIH stroke scale 0 CN 2-10 normal. Motor grossly intact. No gross focal neurologic deficits are appreciated.  Skin:    Skin is warm, dry and intact. No rash noted.  No petechiae, purpura, or bullae.  ____________________________________________    LABS (pertinent positives/negatives) (all labs ordered are listed, but only abnormal results are displayed) Labs Reviewed  APTT - Abnormal; Notable for the following:       Result Value   aPTT 40 (*)    All other components within normal limits  CBC - Abnormal; Notable for the following:    RBC 5.23 (*)    Hemoglobin 10.2 (*)    HCT 32.8 (*)    MCV 62.8 (*)    MCH 19.6 (*)    MCHC 31.2 (*)    RDW 20.2 (*)    All other components within normal limits  DIFFERENTIAL - Abnormal; Notable for the following:    Lymphs Abs 4.0 (*)    All other components within normal limits  COMPREHENSIVE METABOLIC PANEL - Abnormal; Notable for the following:    Glucose, Bld 101 (*)    ALT 10 (*)    All other components within normal limits  PROTIME-INR  TROPONIN I  GLUCOSE, CAPILLARY  CBG  MONITORING, ED   ____________________________________________   EKG    ____________________________________________    RADIOLOGY  CT head negative. Discussed with radiologist  ____________________________________________   PROCEDURES Procedures  ____________________________________________   INITIAL IMPRESSION / ASSESSMENT AND PLAN / ED COURSE  Pertinent labs & imaging results that were available during my care of the patient were reviewed by me and considered in my medical decision making (see chart for details).  Patient well appearing no acute distress. Complains of right-sided headache with dizziness and some visual changes. On exam her stroke scale 0 and she does report that her symptoms are rapidly improving. Discussed  with neurology Dr. Doy Mince after her evaluation as well. Feels that this is unlikely to be a stroke and would treat as a benign headache or migraine. If symptoms resolve patient is suitable for outpatient follow-up. If she has persistent symptoms she may warrant a MRI in the emergency department for further evaluation and risk stratification.     ----------------------------------------- 3:31 PM on 02/20/2016 -----------------------------------------  Patient calm and comfortable. Symptoms resolved. We'll discharge home. She follows up with [facility], and can make an appointment within the next few days. We'll defer further workup until primary care follow-up.Considering the patient's symptoms, medical history, and physical examination today, I have low suspicion for ischemic stroke, intracranial hemorrhage, meningitis, encephalitis, carotid or vertebral dissection, venous sinus thrombosis, MS, intracranial hypertension, glaucoma, CRAO, CRVO, or temporal arteritis.   Clinical Course    ____________________________________________   FINAL CLINICAL IMPRESSION(S) / ED DIAGNOSES  Final diagnoses:  Headache syndrome, complicated        Portions of this note were generated with dragon dictation software. Dictation errors may occur despite best attempts at proofreading.    Carrie Mew, MD 02/20/16 1534    Carrie Mew, MD 02/20/16 1534

## 2016-02-20 NOTE — ED Triage Notes (Signed)
Reports blurred vision on right side onset 1220, headache and dizziness

## 2016-02-20 NOTE — Consult Note (Signed)
Referring Physician: Joni Fears    Chief Complaint: Headache with blurry vision  HPI: Sherri Robertson is an 30 y.o. female who reports today she had acute onset of dizziness.  Patient then developed blurry vision on the right and severe right sided headache.  Headache initially throbbing but now sharp.  Rated at 10/10.  Some photophobia but no nausea or vomiting.  Patient has had headaches in the past without neurological complaints.  Has not had one in quite some time.  From review of the records it does appear that the patient had a similar presentation in 2015.  No visual complaints prior to headache today.  Initial NIHSS of 0.    Date last known well: Date: 02/20/2016 Time last known well: Time: 12:40 tPA Given: No: Improving symptoms  Past Medical History:  Diagnosis Date  . Anemia     Past Surgical History:  Procedure Laterality Date  . CESAREAN SECTION      Family History  Problem Relation Age of Onset  . Hypertension Mother   . Depression Mother   . Hyperlipidemia Mother    Unknown paternal history  Social History:  reports that she has never smoked. She has never used smokeless tobacco. She reports that she drinks alcohol. She reports that she does not use drugs.  Allergies: No Known Allergies  Medications: I have reviewed the patient's current medications. Prior to Admission:  Prior to Admission medications   Medication Sig Start Date End Date Taking? Authorizing Provider  acetaminophen (TYLENOL) 325 MG tablet Take 325 mg by mouth every 6 (six) hours as needed.   Yes Historical Provider, MD  bismuth subsalicylate (PEPTO BISMOL) 262 MG/15ML suspension Take 30 mLs by mouth every 6 (six) hours as needed.   Yes Historical Provider, MD  ibuprofen (ADVIL,MOTRIN) 200 MG tablet Take 400 mg by mouth every 6 (six) hours as needed.   Yes Historical Provider, MD  Lorcaserin HCl 10 MG TABS Take 1 tablet by mouth 2 (two) times daily. Very important to not get pregnant while on this  medicine Patient not taking: Reported on 02/20/2016 08/02/15   Arnetha Courser, MD  Vitamin D, Ergocalciferol, (DRISDOL) 50000 units CAPS capsule Take 1 capsule (50,000 Units total) by mouth every 7 (seven) days. Patient not taking: Reported on 02/20/2016 08/02/15   Arnetha Courser, MD   ROS: History obtained from the patient  General ROS: negative for - chills, fatigue, fever, night sweats, weight gain or weight loss Psychological ROS: negative for - behavioral disorder, hallucinations, memory difficulties, mood swings or suicidal ideation Ophthalmic ROS: as noted in HPI ENT ROS: as noted in HPI Allergy and Immunology ROS: negative for - hives or itchy/watery eyes Hematological and Lymphatic ROS: negative for - bleeding problems, bruising or swollen lymph nodes Endocrine ROS: negative for - galactorrhea, hair pattern changes, polydipsia/polyuria or temperature intolerance Respiratory ROS: negative for - cough, hemoptysis, shortness of breath or wheezing Cardiovascular ROS: negative for - chest pain, dyspnea on exertion, edema or irregular heartbeat Gastrointestinal ROS: negative for - abdominal pain, diarrhea, hematemesis, nausea/vomiting or stool incontinence Genito-Urinary ROS: negative for - dysuria, hematuria, incontinence or urinary frequency/urgency Musculoskeletal ROS: negative for - joint swelling or muscular weakness Neurological ROS: as noted in HPI Dermatological ROS: negative for rash and skin lesion changes  Physical Examination: Blood pressure 108/82, pulse 73, temperature 98.2 F (36.8 C), temperature source Oral, resp. rate 17, SpO2 100 %.  HEENT-  Normocephalic, no lesions, without obvious abnormality.  Normal external eye and  conjunctiva.  Normal TM's bilaterally.  Normal auditory canals and external ears. Normal external nose, mucus membranes and septum.  Normal pharynx. Cardiovascular- S1, S2 normal, pulses palpable throughout   Lungs- chest clear, no wheezing, rales,  normal symmetric air entry Abdomen- soft, non-tender; bowel sounds normal; no masses,  no organomegaly Extremities- no edema Lymph-no adenopathy palpable Musculoskeletal-no joint tenderness, deformity or swelling Skin-warm and dry, no hyperpigmentation, vitiligo, or suspicious lesions  Neurological Examination Mental Status: Alert, oriented, thought content appropriate.  Speech fluent without evidence of aphasia.  Able to follow 3 step commands without difficulty. Cranial Nerves: II: Discs flat bilaterally; Visual fields grossly normal with patient able to count fingers in all visual fields with either eye closed.  Did describe right eye blurry vision.  , pupils equal, round, reactive to light and accommodation III,IV, VI: ptosis not present, extra-ocular motions intact bilaterally V,VII: smile symmetric, facial light touch sensation normal bilaterally VIII: hearing normal bilaterally IX,X: gag reflex present XI: bilateral shoulder shrug XII: midline tongue extension Motor: Right : Upper extremity   5/5    Left:     Upper extremity   5/5  Lower extremity   5/5     Lower extremity   5/5 Tone and bulk:normal tone throughout; no atrophy noted Sensory: Pinprick and light touch intact throughout, bilaterally Deep Tendon Reflexes: 2+ and symmetric throughout Plantars: Right: downgoing   Left: downgoing Cerebellar: Normal finger-to-nose and normal heel-to-shin testing bilaterally Gait: not tested due to pain   Laboratory Studies:  Basic Metabolic Panel: No results for input(s): NA, K, CL, CO2, GLUCOSE, BUN, CREATININE, CALCIUM, MG, PHOS in the last 168 hours.  Liver Function Tests: No results for input(s): AST, ALT, ALKPHOS, BILITOT, PROT, ALBUMIN in the last 168 hours. No results for input(s): LIPASE, AMYLASE in the last 168 hours. No results for input(s): AMMONIA in the last 168 hours.  CBC:  Recent Labs Lab 02/20/16 1307  WBC 10.7  NEUTROABS 5.6  HGB 10.2*  HCT 32.8*  MCV  62.8*  PLT 440    Cardiac Enzymes: No results for input(s): CKTOTAL, CKMB, CKMBINDEX, TROPONINI in the last 168 hours.  BNP: Invalid input(s): POCBNP  CBG:  Recent Labs Lab 02/20/16 1319  GLUCAP 88    Microbiology: Results for orders placed or performed in visit on 02/26/15  Urine Culture     Status: None   Collection Time: 02/26/15 12:00 AM  Result Value Ref Range Status   Urine Culture, Routine Final report  Final   Urine Culture result 1 Comment  Final    Comment: Mixed urogenital flora 10,000-25,000 colony forming units per mL     Coagulation Studies:  Recent Labs  02/20/16 1307  LABPROT 13.0  INR 0.98    Urinalysis: No results for input(s): COLORURINE, LABSPEC, PHURINE, GLUCOSEU, HGBUR, BILIRUBINUR, KETONESUR, PROTEINUR, UROBILINOGEN, NITRITE, LEUKOCYTESUR in the last 168 hours.  Invalid input(s): APPERANCEUR  Lipid Panel: No results found for: CHOL, TRIG, HDL, CHOLHDL, VLDL, LDLCALC  HgbA1C:  Lab Results  Component Value Date   HGBA1C 6.5 (H) 08/01/2015    Urine Drug Screen:  No results found for: LABOPIA, COCAINSCRNUR, LABBENZ, AMPHETMU, THCU, LABBARB  Alcohol Level: No results for input(s): ETH in the last 168 hours.   Imaging: Ct Head Code Stroke W/o Cm  Result Date: 02/20/2016 CLINICAL DATA:  Code stroke. Blurred vision on the right side with headache and dizziness. EXAM: CT HEAD WITHOUT CONTRAST TECHNIQUE: Contiguous axial images were obtained from the base of the skull through  the vertex without intravenous contrast. COMPARISON:  07/22/2013 FINDINGS: Brain: Normal. No evidence of acute infarction, hemorrhage, hydrocephalus, extra-axial collection or mass lesion/mass effect. Vascular: No hyperdense vessel or unexpected calcification. Skull: Normal. Negative for fracture or focal lesion. Sinuses/Orbits: No acute finding. Other: These results were called by telephone at the time of interpretation on 02/20/2016 at 1:11 pm to Dr. Carrie Mew , who  verbally acknowledged these results. ASPECTS [facility] Stroke Program Early CT Score) - Ganglionic level infarction (caudate, lentiform nuclei, internal capsule, insula, M1-M3 cortex): 7 - Supraganglionic infarction (M4-M6 cortex): 3 Total score (0-10 with 10 being normal): 10 IMPRESSION: Normal head CT. ASPECTS is 10. Electronically Signed   By: Monte Fantasia M.D.   On: 02/20/2016 13:16    Assessment: 30 y.o. female presenting with headache and right eye blurring of vision.  Patient with similar presentation in 2015.  Neurological examination is nonfocal other than blurring of vision.  Head CT personally reviewed and shows no acute changes.  Suspect symptoms related to headache and not to acute cerebral ischemia.    Stroke Risk Factors - none  Plan: 1. Analgesia for headache.   2. If no improvement in symptoms with analgesia would perform MRI of the brain without contrast.  Otherwise aptient may contineu to be followed on an outpatient basis.   3. Telemetry monitoring 4. Frequent neuro checks   Case discussed with Dr. Okey Regal, MD Neurology [phone] 02/20/2016, 1:50 PM

## 2016-02-20 NOTE — ED Notes (Signed)
D&C IV 

## 2016-02-20 NOTE — ED Notes (Signed)
Blood sent

## 2016-02-20 NOTE — ED Notes (Signed)
Code  Stroke  Called to [phone] 

## 2017-01-06 ENCOUNTER — Ambulatory Visit: Payer: 59 | Admitting: Family Medicine

## 2017-01-22 ENCOUNTER — Ambulatory Visit (INDEPENDENT_AMBULATORY_CARE_PROVIDER_SITE_OTHER): Payer: BC Managed Care – PPO | Admitting: Family Medicine

## 2017-01-22 ENCOUNTER — Encounter: Payer: Self-pay | Admitting: Family Medicine

## 2017-01-22 VITALS — BP 120/82 | HR 90 | Temp 98.5°F | Resp 14 | Ht 62.63 in | Wt 276.7 lb

## 2017-01-22 DIAGNOSIS — R7309 Other abnormal glucose: Secondary | ICD-10-CM

## 2017-01-22 DIAGNOSIS — E559 Vitamin D deficiency, unspecified: Secondary | ICD-10-CM | POA: Diagnosis not present

## 2017-01-22 DIAGNOSIS — R87619 Unspecified abnormal cytological findings in specimens from cervix uteri: Secondary | ICD-10-CM | POA: Diagnosis not present

## 2017-01-22 DIAGNOSIS — D509 Iron deficiency anemia, unspecified: Secondary | ICD-10-CM | POA: Diagnosis not present

## 2017-01-22 DIAGNOSIS — Z0000 Encounter for general adult medical examination without abnormal findings: Secondary | ICD-10-CM | POA: Diagnosis not present

## 2017-01-22 DIAGNOSIS — Z6841 Body Mass Index (BMI) 40.0 and over, adult: Secondary | ICD-10-CM | POA: Diagnosis not present

## 2017-01-22 DIAGNOSIS — E041 Nontoxic single thyroid nodule: Secondary | ICD-10-CM

## 2017-01-22 NOTE — Assessment & Plan Note (Signed)
Reviewed last [place]; will get another [place]

## 2017-01-22 NOTE — Patient Instructions (Addendum)
We'll have you see Dr. Marcelline Mates Return for fasting labs in the next week or two Return to see me for an appointment to go over your labs and help you with weight loss  Health Maintenance, Female Adopting a healthy lifestyle and getting preventive care can go a long way to promote health and wellness. Talk with your health care provider about what schedule of regular examinations is right for you. This is a good chance for you to check in with your provider about disease prevention and staying healthy. In between checkups, there are plenty of things you can do on your own. Experts have done a lot of research about which lifestyle changes and preventive measures are most likely to keep you healthy. Ask your health care provider for more information. Weight and diet Eat a healthy diet  Be sure to include plenty of vegetables, fruits, low-fat dairy products, and lean protein.  Do not eat a lot of foods high in solid fats, added sugars, or salt.  Get regular exercise. This is one of the most important things you can do for your health. ? Most adults should exercise for at least 150 minutes each week. The exercise should increase your heart rate and make you sweat (moderate-intensity exercise). ? Most adults should also do strengthening exercises at least twice a week. This is in addition to the moderate-intensity exercise.  Maintain a healthy weight  Body mass index (BMI) is a measurement that can be used to identify possible weight problems. It estimates body fat based on height and weight. Your health care provider can help determine your BMI and help you achieve or maintain a healthy weight.  For females 50 years of age and older: ? A BMI below 18.5 is considered underweight. ? A BMI of 18.5 to 24.9 is normal. ? A BMI of 25 to 29.9 is considered overweight. ? A BMI of 30 and above is considered obese.  Watch levels of cholesterol and blood lipids  You should start having your blood tested  for lipids and cholesterol at 32 years of age, then have this test every 5 years.  You may need to have your cholesterol levels checked more often if: ? Your lipid or cholesterol levels are high. ? You are older than 32 years of age. ? You are at high risk for heart disease.  Cancer screening Lung Cancer  Lung cancer screening is recommended for adults 29-40 years old who are at high risk for lung cancer because of a history of smoking.  A yearly low-dose CT scan of the lungs is recommended for people who: ? Currently smoke. ? Have quit within the past 15 years. ? Have at least a 30-pack-year history of smoking. A pack year is smoking an average of one pack of cigarettes a day for 1 year.  Yearly screening should continue until it has been 15 years since you quit.  Yearly screening should stop if you develop a health problem that would prevent you from having lung cancer treatment.  Breast Cancer  Practice breast self-awareness. This means understanding how your breasts normally appear and feel.  It also means doing regular breast self-exams. Let your health care provider know about any changes, no matter how small.  If you are in your 20s or 30s, you should have a clinical breast exam (CBE) by a health care provider every 1-3 years as part of a regular health exam.  If you are 102 or older, have a CBE every year.  Also consider having a breast X-ray (mammogram) every year.  If you have a family history of breast cancer, talk to your health care provider about genetic screening.  If you are at high risk for breast cancer, talk to your health care provider about having an MRI and a mammogram every year.  Breast cancer gene (BRCA) assessment is recommended for women who have family members with BRCA-related cancers. BRCA-related cancers include: ? Breast. ? Ovarian. ? Tubal. ? Peritoneal cancers.  Results of the assessment will determine the need for genetic counseling and BRCA1  and BRCA2 testing.  Cervical Cancer Your health care provider may recommend that you be screened regularly for cancer of the pelvic organs (ovaries, uterus, and vagina). This screening involves a pelvic examination, including checking for microscopic changes to the surface of your cervix (Pap test). You may be encouraged to have this screening done every 3 years, beginning at age 33.  For women ages 24-65, health care providers may recommend pelvic exams and Pap testing every 3 years, or they may recommend the Pap and pelvic exam, combined with testing for human papilloma virus (HPV), every 5 years. Some types of HPV increase your risk of cervical cancer. Testing for HPV may also be done on women of any age with unclear Pap test results.  Other health care providers may not recommend any screening for nonpregnant women who are considered low risk for pelvic cancer and who do not have symptoms. Ask your health care provider if a screening pelvic exam is right for you.  If you have had past treatment for cervical cancer or a condition that could lead to cancer, you need Pap tests and screening for cancer for at least 20 years after your treatment. If Pap tests have been discontinued, your risk factors (such as having a new sexual partner) need to be reassessed to determine if screening should resume. Some women have medical problems that increase the chance of getting cervical cancer. In these cases, your health care provider may recommend more frequent screening and Pap tests.  Colorectal Cancer  This type of cancer can be detected and often prevented.  Routine colorectal cancer screening usually begins at 32 years of age and continues through 32 years of age.  Your health care provider may recommend screening at an earlier age if you have risk factors for colon cancer.  Your health care provider may also recommend using home test kits to check for hidden blood in the stool.  A small camera at  the end of a tube can be used to examine your colon directly (sigmoidoscopy or colonoscopy). This is done to check for the earliest forms of colorectal cancer.  Routine screening usually begins at age 45.  Direct examination of the colon should be repeated every 5-10 years through 32 years of age. However, you may need to be screened more often if early forms of precancerous polyps or small growths are found.  Skin Cancer  Check your skin from head to toe regularly.  Tell your health care provider about any new moles or changes in moles, especially if there is a change in a mole's shape or color.  Also tell your health care provider if you have a mole that is larger than the size of a pencil eraser.  Always use sunscreen. Apply sunscreen liberally and repeatedly throughout the day.  Protect yourself by wearing long sleeves, pants, a wide-brimmed hat, and sunglasses whenever you are outside.  Heart disease, diabetes, and high  blood pressure  High blood pressure causes heart disease and increases the risk of stroke. High blood pressure is more likely to develop in: ? People who have blood pressure in the high end of the normal range (130-139/85-89 mm Hg). ? People who are overweight or obese. ? People who are African American.  If you are 36-3 years of age, have your blood pressure checked every 3-5 years. If you are 75 years of age or older, have your blood pressure checked every year. You should have your blood pressure measured twice-once when you are at a hospital or clinic, and once when you are not at a hospital or clinic. Record the average of the two measurements. To check your blood pressure when you are not at a hospital or clinic, you can use: ? An automated blood pressure machine at a pharmacy. ? A home blood pressure monitor.  If you are between 44 years and 52 years old, ask your health care provider if you should take aspirin to prevent strokes.  Have regular diabetes  screenings. This involves taking a blood sample to check your fasting blood sugar level. ? If you are at a normal weight and have a low risk for diabetes, have this test once every three years after 32 years of age. ? If you are overweight and have a high risk for diabetes, consider being tested at a younger age or more often. Preventing infection Hepatitis B  If you have a higher risk for hepatitis B, you should be screened for this virus. You are considered at high risk for hepatitis B if: ? You were born in a country where hepatitis B is common. Ask your health care provider which countries are considered high risk. ? Your parents were born in a high-risk country, and you have not been immunized against hepatitis B (hepatitis B vaccine). ? You have HIV or AIDS. ? You use needles to inject street drugs. ? You live with someone who has hepatitis B. ? You have had sex with someone who has hepatitis B. ? You get hemodialysis treatment. ? You take certain medicines for conditions, including cancer, organ transplantation, and autoimmune conditions.  Hepatitis C  Blood testing is recommended for: ? Everyone born from [birth year] through 1966. ? Anyone with known risk factors for hepatitis C.  Sexually transmitted infections (STIs)  You should be screened for sexually transmitted infections (STIs) including gonorrhea and chlamydia if: ? You are sexually active and are younger than 32 years of age. ? You are older than 32 years of age and your health care provider tells you that you are at risk for this type of infection. ? Your sexual activity has changed since you were last screened and you are at an increased risk for chlamydia or gonorrhea. Ask your health care provider if you are at risk.  If you do not have HIV, but are at risk, it may be recommended that you take a prescription medicine daily to prevent HIV infection. This is called pre-exposure prophylaxis (PrEP). You are considered at risk  if: ? You are sexually active and do not regularly use condoms or know the HIV status of your partner(s). ? You take drugs by injection. ? You are sexually active with a partner who has HIV.  Talk with your health care provider about whether you are at high risk of being infected with HIV. If you choose to begin PrEP, you should first be tested for HIV. You should then be tested  every 3 months for as long as you are taking PrEP. Pregnancy  If you are premenopausal and you may become pregnant, ask your health care provider about preconception counseling.  If you may become pregnant, take 400 to 800 micrograms (mcg) of folic acid every day.  If you want to prevent pregnancy, talk to your health care provider about birth control (contraception). Osteoporosis and menopause  Osteoporosis is a disease in which the bones lose minerals and strength with aging. This can result in serious bone fractures. Your risk for osteoporosis can be identified using a bone density scan.  If you are 64 years of age or older, or if you are at risk for osteoporosis and fractures, ask your health care provider if you should be screened.  Ask your health care provider whether you should take a calcium or vitamin D supplement to lower your risk for osteoporosis.  Menopause may have certain physical symptoms and risks.  Hormone replacement therapy may reduce some of these symptoms and risks. Talk to your health care provider about whether hormone replacement therapy is right for you. Follow these instructions at home:  Schedule regular health, dental, and eye exams.  Stay current with your immunizations.  Do not use any tobacco products including cigarettes, chewing tobacco, or electronic cigarettes.  If you are pregnant, do not drink alcohol.  If you are breastfeeding, limit how much and how often you drink alcohol.  Limit alcohol intake to no more than 1 drink per day for nonpregnant women. One drink  equals 12 ounces of beer, 5 ounces of wine, or 1 ounces of hard liquor.  Do not use street drugs.  Do not share needles.  Ask your health care provider for help if you need support or information about quitting drugs.  Tell your health care provider if you often feel depressed.  Tell your health care provider if you have ever been abused or do not feel safe at home. This information is not intended to replace advice given to you by your health care provider. Make sure you discuss any questions you have with your health care provider. Document Released: 10/14/2010 Document Revised: 09/06/2015 Document Reviewed: 01/02/2015 Elsevier Interactive Patient Education  [employer].

## 2017-01-22 NOTE — Assessment & Plan Note (Signed)
Check labs 
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
## 2017-01-22 NOTE — Assessment & Plan Note (Signed)
USPSTF grade A and B recommendations reviewed with patient; age-appropriate recommendations, preventive care, screening tests, etc discussed and encouraged; healthy living encouraged; see AVS for patient education given to patient  

## 2017-01-22 NOTE — Assessment & Plan Note (Signed)
Check level and replace as indicated

## 2017-01-22 NOTE — Assessment & Plan Note (Signed)
Refer back to GYN.
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
## 2017-01-29 ENCOUNTER — Encounter: Payer: Self-pay | Admitting: Family Medicine

## 2017-01-29 ENCOUNTER — Ambulatory Visit (INDEPENDENT_AMBULATORY_CARE_PROVIDER_SITE_OTHER): Payer: BC Managed Care – PPO | Admitting: Family Medicine

## 2017-01-29 ENCOUNTER — Ambulatory Visit
Admission: RE | Admit: 2017-01-29 | Discharge: 2017-01-29 | Disposition: A | Discharge status: Home / Self Care | Payer: BC Managed Care – PPO | Source: Ambulatory Visit | Attending: Family Medicine | Admitting: Family Medicine

## 2017-01-29 VITALS — BP 122/74 | HR 89 | Temp 98.1°F | Resp 16 | Wt 276.5 lb

## 2017-01-29 DIAGNOSIS — R109 Unspecified abdominal pain: Secondary | ICD-10-CM | POA: Diagnosis not present

## 2017-01-29 DIAGNOSIS — M545 Low back pain: Secondary | ICD-10-CM

## 2017-01-29 DIAGNOSIS — E041 Nontoxic single thyroid nodule: Secondary | ICD-10-CM

## 2017-01-29 LAB — POCT URINALYSIS DIPSTICK
Bilirubin, UA: NEGATIVE
Blood, UA: NEGATIVE
Glucose, UA: NEGATIVE
Leukocytes, UA: NEGATIVE
Nitrite, UA: NEGATIVE
PH UA: 6.5 (ref 5.0–8.0)
SPEC GRAV UA: 1.02 (ref 1.010–1.025)
Urobilinogen, UA: 0.2 E.U./dL

## 2017-01-29 MED ORDER — NITROFURANTOIN MONOHYD MACRO 100 MG PO CAPS: 100.0000 mg | ORAL_CAPSULE | Freq: Two times a day (BID) | ORAL | 0 refills | Status: DC

## 2017-01-29 NOTE — Patient Instructions (Signed)
Start the antibiotics Please do eat yogurt daily or take a probiotic daily for the next month or two We want to replace the healthy germs in the gut If you notice foul, watery diarrhea in the next two months, schedule an appointment RIGHT AWAY

## 2017-01-29 NOTE — Progress Notes (Signed)
BP 122/74   Pulse 89   Temp 98.1 F (36.7 C) (Oral)   Resp 16   Wt 276 lb 8 oz (125.4 kg)   LMP 12/27/2016   SpO2 98%   BMI 49.57 kg/m    Subjective:    [name], female    DOB: 10-23-1985, 31 y.o.   MRN: [id number]  HPI: Sherri Robertson is a 31 y.o. female  Chief Complaint  Patient presents with  . Urinary Tract Infection    back pain    HPI Patient is here for an acute visit She is having back pain, lower, both sides, radiating up Even putting heat on it, didn't help; not that kind of hurt Not strained, like an achy feeling No injury, no lifting, nothing to suggest back muscular issues No hx of kidney stones No fevers No burning No blood in the urine Symptoms just started yesterday Last year she had back pain, "my back was just killing me"; went to the hospital, it was a UTI; we reviewed the urine from before together; 2+ LE; no blood  Depression screen [facility] 2/9 01/22/2017 08/01/2015 01/26/2015  Decreased Interest 0 0 0  Down, Depressed, Hopeless 0 0 0  PHQ - 2 Score 0 0 0    Relevant past medical, surgical, family and social history reviewed Past Medical History:  Diagnosis Date  . Anemia    Past Surgical History:  Procedure Laterality Date  . CESAREAN SECTION     Family History  Problem Relation Age of Onset  . Hypertension Mother   . Depression Mother   . Hyperlipidemia Mother   . Depression Sister   . Anxiety disorder Sister   . Anxiety disorder Brother   . Eczema Daughter   . COPD Maternal Grandmother   . Emphysema Maternal Grandmother   . Cancer Maternal Grandfather        stomach cancer   Social History   Social History  . Marital status: Single    Spouse name: N/A  . Number of children: N/A  . Years of education: N/A   Occupational History  . Not on file.   Social History Main Topics  . Smoking status: Never Smoker  . Smokeless tobacco: Never Used  . Alcohol use Yes     Comment: occassional  . Drug use:  No  . Sexual activity: Yes   Other Topics Concern  . Not on file   Social History Narrative  . No narrative on file    Interim medical history since last visit reviewed. Allergies and medications reviewed  Review of Systems Per HPI unless specifically indicated above     Objective:    BP 122/74   Pulse 89   Temp 98.1 F (36.7 C) (Oral)   Resp 16   Wt 276 lb 8 oz (125.4 kg)   LMP 12/27/2016   SpO2 98%   BMI 49.57 kg/m   Wt Readings from Last 3 Encounters:  01/29/17 276 lb 8 oz (125.4 kg)  01/22/17 276 lb 11.2 oz (125.5 kg)  12/29/15 270 lb (122.5 kg)    Physical Exam  Constitutional: She appears well-developed and well-nourished.  HENT:  Mouth/Throat: Mucous membranes are normal.  Eyes: EOM are normal. No scleral icterus.  Cardiovascular: Normal rate and regular rhythm.   Pulmonary/Chest: Effort normal and breath sounds normal.  Abdominal: She exhibits no distension. There is no tenderness.  Obese, exam limited by body habitus  Musculoskeletal:  Lumbar back: She exhibits normal range of motion, no tenderness, no bony tenderness, no swelling and no edema.  Skin: No rash (specifically, no rash over the lower back) noted. She is not diaphoretic. No pallor.  Psychiatric: She has a normal mood and affect. Her behavior is normal.    Results for orders placed or performed in visit on 01/29/17  POCT urinalysis dipstick  Result Value Ref Range   Color, UA dark yellow    Clarity, UA clear    Glucose, UA neg    Bilirubin, UA neg    Ketones, UA trace    Spec Grav, UA 1.020 1.010 - 1.025   Blood, UA neg    pH, UA 6.5 5.0 - 8.0   Protein, UA trace    Urobilinogen, UA 0.2 0.2 or 1.0 E.U./dL   Nitrite, UA neg    Leukocytes, UA Negative Negative  staff said urine was "potent"     Assessment & Plan:   Problem List Items Addressed This Visit    None    Visit Diagnoses    Low back pain, unspecified back pain laterality, unspecified chronicity, with sciatica  presence unspecified    -  Primary   suspicious for UTI; symptoms similar to last UTI; treat; patient was asked to notify me if not getting better, or getting worse   Flank pain       no blood in urine; may be musculoskeletal, but will treat for possible UTI; culture pending; notify me if not improving   Relevant Orders   POCT urinalysis dipstick (Completed)   Urine Culture      Follow up plan: No Follow-up on file.  An after-visit summary was printed and given to the patient at [facility].  Please see the patient instructions which may contain other information and recommendations beyond what is mentioned above in the assessment and plan.  Meds ordered this encounter  Medications  . nitrofurantoin, macrocrystal-monohydrate, (MACROBID) 100 MG capsule    Sig: Take 1 capsule (100 mg total) by mouth 2 (two) times daily.    Dispense:  6 capsule    Refill:  0    Orders Placed This Encounter  Procedures  . Urine Culture  . POCT urinalysis dipstick

## 2017-01-30 ENCOUNTER — Other Ambulatory Visit: Payer: 59

## 2017-01-30 LAB — URINE CULTURE
MICRO NUMBER: 81167211
SPECIMEN QUALITY:: ADEQUATE

## 2017-02-02 ENCOUNTER — Other Ambulatory Visit: Payer: Self-pay | Admitting: Family Medicine

## 2017-02-02 DIAGNOSIS — D509 Iron deficiency anemia, unspecified: Secondary | ICD-10-CM

## 2017-02-03 ENCOUNTER — Telehealth: Payer: Self-pay

## 2017-02-03 ENCOUNTER — Telehealth: Payer: Self-pay | Admitting: Family Medicine

## 2017-02-03 LAB — COMPLETE METABOLIC PANEL WITH GFR
AG RATIO: 1 (calc) (ref 1.0–2.5)
ALKALINE PHOSPHATASE (APISO): 76 U/L (ref 33–115)
ALT: 5 U/L — ABNORMAL LOW (ref 6–29)
AST: 9 U/L — ABNORMAL LOW (ref 10–30)
Albumin: 3.4 g/dL — ABNORMAL LOW (ref 3.6–5.1)
BILIRUBIN TOTAL: 0.3 mg/dL (ref 0.2–1.2)
BUN: 12 mg/dL (ref 7–25)
CALCIUM: 8.8 mg/dL (ref 8.6–10.2)
CHLORIDE: 106 mmol/L (ref 98–110)
CO2: 30 mmol/L (ref 20–32)
Creat: 0.75 mg/dL (ref 0.50–1.10)
GFR, EST NON AFRICAN AMERICAN: 105 mL/min/{1.73_m2} (ref >=60)
GFR, Est African American: 122 mL/min/{1.73_m2} (ref >=60)
GLOBULIN: 3.3 g/dL (ref 1.9–3.7)
Glucose, Bld: 97 mg/dL (ref 65–99)
POTASSIUM: 4.1 mmol/L (ref 3.5–5.3)
SODIUM: 139 mmol/L (ref 135–146)
Total Protein: 6.7 g/dL (ref 6.1–8.1)

## 2017-02-03 LAB — CBC WITH DIFFERENTIAL/PLATELET
Basophils Absolute: 31 cells/uL (ref 0–200)
Basophils Relative: 0.4 %
EOS ABS: 139 {cells}/uL (ref 15–500)
Eosinophils Relative: 1.8 %
HEMATOCRIT: 32.9 % — AB (ref 35.0–45.0)
Hemoglobin: 9.7 g/dL — ABNORMAL LOW (ref 11.7–15.5)
Lymphs Abs: 3388 cells/uL (ref 850–3900)
MCH: 19.2 pg — AB (ref 27.0–33.0)
MCHC: 29.5 g/dL — AB (ref 32.0–36.0)
MCV: 65.3 fL — AB (ref 80.0–100.0)
MONOS PCT: 6.3 %
MPV: 9.1 fL (ref 7.5–12.5)
NEUTROS PCT: 47.5 %
Neutro Abs: 3658 cells/uL (ref 1500–7800)
Platelets: 447 10*3/uL — ABNORMAL HIGH (ref 140–400)
RBC: 5.04 10*6/uL (ref 3.80–5.10)
RDW: 17.6 % — AB (ref 11.0–15.0)
TOTAL LYMPHOCYTE: 44 %
WBC mixed population: 485 cells/uL (ref 200–950)
WBC: 7.7 10*3/uL (ref 3.8–10.8)

## 2017-02-03 LAB — IRON,TIBC AND FERRITIN PANEL
%SAT: 7 % — AB (ref 11–50)
Ferritin: 25 ng/mL (ref 10–154)
IRON: 20 ug/dL — AB (ref 40–190)
TIBC: 305 ug/dL (ref 250–450)

## 2017-02-03 LAB — TSH: TSH: 1.59 mIU/L

## 2017-02-03 LAB — HEMOGLOBIN A1C
EAG (MMOL/L): 7.1 (calc)
Hgb A1c MFr Bld: 6.1 % of total Hgb — ABNORMAL HIGH (ref <5.7)
MEAN PLASMA GLUCOSE: 128 (calc)

## 2017-02-03 LAB — VITAMIN D 25 HYDROXY (VIT D DEFICIENCY, FRACTURES): VIT D 25 HYDROXY: 9 ng/mL — AB (ref 30–100)

## 2017-02-03 MED ORDER — VITAMIN D (ERGOCALCIFEROL) 1.25 MG (50000 UNIT) PO CAPS: 50000.0000 [IU] | ORAL_CAPSULE | ORAL | 1 refills | Status: DC

## 2017-02-03 NOTE — Telephone Encounter (Signed)
Please let pt know that her vitamin D level is really low I'd like her to start vitamin D prescription once a week for 8 weeks, then 1,000 iu OTC vitamin D3 once a day  Her anemia is pretty significant, so I would like for her to get back in to see the hematologist (referral already entered)  Her thyroid test is normal  Her 3 month blood sugar average came down, which is good news; keep up the good work  Nothing else worrisome on labs, all stable  Thank you

## 2017-02-03 NOTE — Telephone Encounter (Signed)
Called pt informed her of negative urine cx. Pt gave verbal understanding but states she is still having lots of back pain. Advised pt to d/c antibiotic. Will f/u with back pain at appt in 2 days that was previously scheduled.

## 2017-02-03 NOTE — Telephone Encounter (Signed)
-----   Message from Arnetha Courser, MD sent at 02/02/2017  8:22 AM EDT ----- Please let pt know that her urine culture did not grow out any major infection; if symptoms persist, let us know and we can check another specimen; thank you

## 2017-02-04 NOTE — Telephone Encounter (Signed)
Left detailed voicemail

## 2017-02-05 ENCOUNTER — Ambulatory Visit (INDEPENDENT_AMBULATORY_CARE_PROVIDER_SITE_OTHER): Payer: BC Managed Care – PPO | Admitting: Family Medicine

## 2017-02-05 ENCOUNTER — Encounter: Payer: Self-pay | Admitting: Family Medicine

## 2017-02-05 VITALS — BP 118/74 | HR 91 | Temp 98.3°F | Resp 16 | Wt 280.6 lb

## 2017-02-05 DIAGNOSIS — D171 Benign lipomatous neoplasm of skin and subcutaneous tissue of trunk: Secondary | ICD-10-CM | POA: Diagnosis not present

## 2017-02-05 DIAGNOSIS — R7309 Other abnormal glucose: Secondary | ICD-10-CM | POA: Diagnosis not present

## 2017-02-05 DIAGNOSIS — L83 Acanthosis nigricans: Secondary | ICD-10-CM

## 2017-02-05 DIAGNOSIS — R7303 Prediabetes: Secondary | ICD-10-CM | POA: Diagnosis not present

## 2017-02-05 DIAGNOSIS — D509 Iron deficiency anemia, unspecified: Secondary | ICD-10-CM

## 2017-02-05 DIAGNOSIS — N946 Dysmenorrhea, unspecified: Secondary | ICD-10-CM

## 2017-02-05 DIAGNOSIS — Z6841 Body Mass Index (BMI) 40.0 and over, adult: Secondary | ICD-10-CM

## 2017-02-05 DIAGNOSIS — M546 Pain in thoracic spine: Secondary | ICD-10-CM | POA: Diagnosis not present

## 2017-02-05 HISTORY — DX: Prediabetes: R73.03

## 2017-02-05 MED ORDER — CYCLOBENZAPRINE HCL 5 MG PO TABS: 5.0000 mg | ORAL_TABLET | Freq: Three times a day (TID) | ORAL | 0 refills | Status: DC | PRN

## 2017-02-05 MED ORDER — LIRAGLUTIDE -WEIGHT MANAGEMENT 18 MG/3ML ~~LOC~~ SOPN: 0.6000 mg | PEN_INJECTOR | Freq: Every day | SUBCUTANEOUS | 0 refills | Status: DC

## 2017-02-05 NOTE — Assessment & Plan Note (Signed)
Single A1c of 6.5; thorough chart review could not find another glucose or A1c in the diabetes range; weight loss is key; she does not want metformin

## 2017-02-05 NOTE — Patient Instructions (Addendum)
Consider quinoa for protein You can use the cyclobenzaprine to relax your muscles Have your husband keep an eye on the swelling above your bra line on the right side and let me know if it grows We'll start the injectable medicine You can do this!Liraglutide injection (Weight Management) What is this medicine? LIRAGLUTIDE (LIR a GLOO tide) is used with a reduced calorie diet and exercise to help you lose weight. This medicine may be used for other purposes; ask your health care provider or pharmacist if you have questions. COMMON BRAND NAME(S): Saxenda What should I tell my health care provider before I take this medicine? They need to know if you have any of these conditions: -endocrine tumors (MEN 2) or if someone in your family had these tumors -gallbladder disease -high cholesterol -history of alcohol abuse problem -history of pancreatitis -kidney disease or if you are on dialysis -liver disease -previous swelling of the tongue, face, or lips with difficulty breathing, difficulty swallowing, hoarseness, or tightening of the throat -stomach problems -suicidal thoughts, plans, or attempt; a previous suicide attempt by you or a family member -thyroid cancer or if someone in your family had thyroid cancer -an unusual or allergic reaction to liraglutide, other medicines, foods, dyes, or preservatives -pregnant or trying to get pregnant -breast-feeding How should I use this medicine? This medicine is for injection under the skin of your upper leg, stomach area, or upper arm. You will be taught how to prepare and give this medicine. Use exactly as directed. Take your medicine at regular intervals. Do not take it more often than directed. It is important that you put your used needles and syringes in a special sharps container. Do not put them in a trash can. If you do not have a sharps container, call your pharmacist or healthcare provider to get one. A special MedGuide will be given to you  by the pharmacist with each prescription and refill. Be sure to read this information carefully each time. Talk to your pediatrician regarding the use of this medicine in children. Special care may be needed. Overdosage: If you think you have taken too much of this medicine contact a poison control center or emergency room at once. NOTE: This medicine is only for you. Do not share this medicine with others. What if I miss a dose? If you miss a dose, take it as soon as you can. If it is almost time for your next dose, take only that dose. Do not take double or extra doses. If you miss your dose for 3 days or more, call your doctor or health care professional to talk about how to restart this medicine. What may interact with this medicine? -insulin and other medicines for diabetes This list may not describe all possible interactions. Give your health care provider a list of all the medicines, herbs, non-prescription drugs, or dietary supplements you use. Also tell them if you smoke, drink alcohol, or use illegal drugs. Some items may interact with your medicine. What should I watch for while using this medicine? Visit your doctor or health care professional for regular checks on your progress. This medicine is intended to be used in addition to a healthy diet and appropriate exercise. The best results are achieved this way. Do not increase or in any way change your dose without consulting your doctor or health care professional. Drink plenty of fluids while taking this medicine. Check with your doctor or health care professional if you get an attack of severe  diarrhea, nausea, and vomiting. The loss of too much body fluid can make it dangerous for you to take this medicine. This medicine may affect blood sugar levels. If you have diabetes, check with your doctor or health care professional before you change your diet or the dose of your diabetic medicine. Patients and their families should watch out for  worsening depression or thoughts of suicide. Also watch out for sudden changes in feelings such as feeling anxious, agitated, panicky, irritable, hostile, aggressive, impulsive, severely restless, overly excited and hyperactive, or not being able to sleep. If this happens, especially at the beginning of treatment or after a change in dose, call your health care professional. What side effects may I notice from receiving this medicine? Side effects that you should report to your doctor or health care professional as soon as possible: -allergic reactions like skin rash, itching or hives, swelling of the face, lips, or tongue -breathing problems -diarrhea that continues or is severe -lump or swelling on the neck -severe nausea -signs and symptoms of infection like fever or chills; cough; sore throat; pain or trouble passing urine -signs and symptoms of low blood sugar such as feeling anxious, confusion, dizziness, increased hunger, unusually weak or tired, sweating, shakiness, cold, irritable, headache, blurred vision, fast heartbeat, loss of consciousness -signs and symptoms of kidney injury like trouble passing urine or change in the amount of urine -trouble swallowing -unusual stomach upset or pain -vomiting Side effects that usually do not require medical attention (report to your doctor or health care professional if they continue or are bothersome): -constipation -decreased appetite -diarrhea -fatigue -headache -nausea -pain, redness, or irritation at site where injected -stomach upset -stuffy or runny nose This list may not describe all possible side effects. Call your doctor for medical advice about side effects. You may report side effects to FDA at 1-800-FDA-1088. Where should I keep my medicine? Keep out of the reach of children. Store unopened pen in a refrigerator between 2 and 8 degrees C (36 and 46 degrees F). Do not freeze or use if the medicine has been frozen. Protect from  light and excessive heat. After you first use the pen, it can be stored at room temperature between 15 and 30 degrees C (59 and 86 degrees F) or in a refrigerator. Throw away your used pen after 30 days or after the expiration date, whichever comes first. Do not store your pen with the needle attached. If the needle is left on, medicine may leak from the pen. NOTE: This sheet is a summary. It may not cover all possible information. If you have questions about this medicine, talk to your doctor, pharmacist, or health care provider.  2018 Elsevier/Gold Standard (2016-04-17 14:41:37)

## 2017-02-05 NOTE — Assessment & Plan Note (Signed)
Discussed weight, eating habits, treatment with GLP-1 prescribed; will see her back in 6 weeks; I am hopeful that as her anemia is treated, she'll have more energy and will be able to exercise more often

## 2017-02-05 NOTE — Assessment & Plan Note (Signed)
Signifying insulin resistance

## 2017-02-05 NOTE — Progress Notes (Signed)
[name], female   DOB: 1985/04/25, 31 y.o.   MRN: [id number]   Subjective:   Sherri Robertson is a 31 y.o. female here for a complete physical exam  Interim issues since last visit:   USPSTF grade A and B recommendations Depression:  Depression screen [facility] 2/9 01/22/2017 08/01/2015 01/26/2015  Decreased Interest 0 0 0  Down, Depressed, Hopeless 0 0 0  PHQ - 2 Score 0 0 0   Hypertension: BP Readings from Last 3 Encounters:  02/05/17 118/74  01/29/17 122/74  01/22/17 120/82   Obesity: Wt Readings from Last 3 Encounters:  02/05/17 280 lb 9.6 oz (127.3 kg)  01/29/17 276 lb 8 oz (125.4 kg)  01/22/17 276 lb 11.2 oz (125.5 kg)   BMI Readings from Last 3 Encounters:  02/05/17 50.30 kg/m  01/29/17 49.57 kg/m  01/22/17 49.60 kg/m    Alcohol:  Tobacco use:  HIV, hep B, hep C:  STD testing and prevention (chl/gon/syphilis):  Intimate partner violence: Breast cancer:  BRCA gene screening:  Cervical cancer screening:  Osteoporosis:  Fall prevention/vitamin D:  Lipids:  No results found for: CHOL No results found for: HDL No results found for: LDLCALC No results found for: TRIG No results found for: CHOLHDL No results found for: LDLDIRECT Glucose:  Glucose  Date Value Ref Range Status  06/13/2013 99 65 - 99 mg/dL Final  08/04/2012 102 (H) 65 - 99 mg/dL Final   Glucose, Bld  Date Value Ref Range Status  02/02/2017 97 65 - 99 mg/dL Final    Comment:    .            Fasting reference interval .   02/20/2016 101 (H) 65 - 99 mg/dL Final  12/29/2015 98 65 - 99 mg/dL Final   Glucose-Capillary  Date Value Ref Range Status  02/20/2016 88 65 - 99 mg/dL Final   Colorectal cancer: no colorectal cancer in the family Aspirin:  Diet:  Exercise:  Skin cancer:    Past Medical History:  Diagnosis Date  . Anemia    Past Surgical History:  Procedure Laterality Date  . CESAREAN SECTION     Family History  Problem Relation Age of Onset  .  Hypertension Mother   . Depression Mother   . Hyperlipidemia Mother   . Depression Sister   . Anxiety disorder Sister   . Anxiety disorder Brother   . Eczema Daughter   . COPD Maternal Grandmother   . Emphysema Maternal Grandmother   . Cancer Maternal Grandfather        stomach cancer   Social History  Substance Use Topics  . Smoking status: Never Smoker  . Smokeless tobacco: Never Used  . Alcohol use Yes     Comment: occassional   Review of Systems  Objective:   Vitals:   02/05/17 1409  BP: 118/74  Pulse: 91  Resp: 16  Temp: 98.3 F (36.8 C)  TempSrc: Oral  SpO2: 98%  Weight: 280 lb 9.6 oz (127.3 kg)   Body mass index is 50.3 kg/m. Wt Readings from Last 3 Encounters:  02/05/17 280 lb 9.6 oz (127.3 kg)  01/29/17 276 lb 8 oz (125.4 kg)  01/22/17 276 lb 11.2 oz (125.5 kg)   Physical Exam  Assessment/Plan:   Problem List Items Addressed This Visit    None       No orders of the defined types were placed in this encounter.  No orders of the defined types were placed  in this encounter.   Follow up plan: No Follow-up on file.  An After Visit Summary was printed and given to the patient.

## 2017-02-05 NOTE — Assessment & Plan Note (Signed)
She will work with her gynecologist; considering endometrial ablation

## 2017-02-05 NOTE — Progress Notes (Signed)
BP 118/74   Pulse 91   Temp 98.3 F (36.8 C) (Oral)   Resp 16   Wt 280 lb 9.6 oz (127.3 kg)   LMP 02/04/2017   SpO2 98%   BMI 50.30 kg/m    Subjective:    [name], female    DOB: 09-Nov-1985, 31 y.o.   MRN: [id number]  HPI: Sherri Robertson is a 31 y.o. female  Chief Complaint  Patient presents with  . Follow-up    HPI  Patient was just here for a physical on 01/22/2017 She is here for follow-up for her obesity She has prediabetes; consider injection for weight loss No fam hx of MEN-2 No personal hx of thyroid cancer No one in the family hx diabetes A1c of 6.5 one time; chart reviewed back through this computer system and the old computer system; no fasting glucose of 126 or higher and no other A1c in the diabetic range Patient does not want to take metformin; she has terrible reviews and I respect her concerns Willing to do the injectable, but does not want metformin  She does not even buy the sugary drinks; only buys water; drinks water with lemon; likes sweets, sugary stuff, gummi bears; like carbs more than proteins; she knows what she is supposed to be eating; likes pasta and bread; rarely eating meat; eats veggies and loves veggies; thinks she should be having a [place] breast or chicken She is tired all the time (see note about the anemia); not really as physically active as she needs to be  Having back pain, mid back and lower back Both sides affected Taking acetaminophen and ibuprofen constantly to prevent pain Her mother had similar symptoms which resolved after she lost weight  Anemia; going to see hematologist next Friday; always tired; some DOE; thinks that is from weight gain, more conditioning and extra weight; she is also going to work with her gynecologist about her heavy periods and consider endometrial ablation   Depression screen [facility] 2/9 01/22/2017 08/01/2015 01/26/2015  Decreased Interest 0 0 0  Down, Depressed, Hopeless 0 0 0    PHQ - 2 Score 0 0 0    Relevant past medical, surgical, family and social history reviewed Past Medical History:  Diagnosis Date  . Anemia    Past Surgical History:  Procedure Laterality Date  . CESAREAN SECTION     Family History  Problem Relation Age of Onset  . Hypertension Mother   . Depression Mother   . Hyperlipidemia Mother   . Depression Sister   . Anxiety disorder Sister   . Anxiety disorder Brother   . Eczema Daughter   . COPD Maternal Grandmother   . Emphysema Maternal Grandmother   . Cancer Maternal Grandfather        stomach cancer   Social History   Social History  . Marital status: Single    Spouse name: N/A  . Number of children: N/A  . Years of education: N/A   Occupational History  . Not on file.   Social History Main Topics  . Smoking status: Never Smoker  . Smokeless tobacco: Never Used  . Alcohol use Yes     Comment: occassional  . Drug use: No  . Sexual activity: Yes   Other Topics Concern  . Not on file   Social History Narrative  . No narrative on file    Interim medical history since last visit reviewed. Allergies and medications reviewed  Review of  Systems Per HPI unless specifically indicated above     Objective:    BP 118/74   Pulse 91   Temp 98.3 F (36.8 C) (Oral)   Resp 16   Wt 280 lb 9.6 oz (127.3 kg)   LMP 02/04/2017   SpO2 98%   BMI 50.30 kg/m   Wt Readings from Last 3 Encounters:  02/05/17 280 lb 9.6 oz (127.3 kg)  01/29/17 276 lb 8 oz (125.4 kg)  01/22/17 276 lb 11.2 oz (125.5 kg)    Physical Exam  Constitutional: She appears well-developed and well-nourished.  HENT:  Mouth/Throat: Mucous membranes are normal.  Eyes: EOM are normal. No scleral icterus.  Cardiovascular: Normal rate and regular rhythm.   Pulmonary/Chest: Effort normal and breath sounds normal.  Abdominal: She exhibits no distension. There is no tenderness.  Obese, exam limited by body habitus  Musculoskeletal:       Thoracic  back: She exhibits tenderness. She exhibits normal range of motion, no bony tenderness, no swelling, no edema and no spasm.       Lumbar back: She exhibits normal range of motion, no tenderness, no bony tenderness, no swelling and no edema.       Back:  Soft fatty tumor noted below the skin right of the midline just above the bra line; no open pores, no overlying skin changes; about the size of a quarter or fifty-cent piece  Skin: No rash (specifically, no rash over the lower back) noted. She is not diaphoretic. No pallor.  Velvety hyperpigmentation noted in the folds of the posterior nape of neck  Psychiatric: She has a normal mood and affect. Her behavior is normal. Her mood appears not anxious. She does not exhibit a depressed mood.    Results for orders placed or performed in visit on 01/29/17  Urine Culture  Result Value Ref Range   MICRO NUMBER: [id number]    SPECIMEN QUALITY: ADEQUATE    Sample Source URINE    STATUS: FINAL    Result:      Multiple organisms present, each less than 10,000 CFU/mL. These organisms, commonly found on external and internal genitalia, are considered to be colonizers. No further testing performed.  POCT urinalysis dipstick  Result Value Ref Range   Color, UA dark yellow    Clarity, UA clear    Glucose, UA neg    Bilirubin, UA neg    Ketones, UA trace    Spec Grav, UA 1.020 1.010 - 1.025   Blood, UA neg    pH, UA 6.5 5.0 - 8.0   Protein, UA trace    Urobilinogen, UA 0.2 0.2 or 1.0 E.U./dL   Nitrite, UA neg    Leukocytes, UA Negative Negative      Assessment & Plan:   Problem List Items Addressed This Visit      Musculoskeletal and Integument   Acanthosis nigricans    Signifying insulin resistance        Genitourinary   Dysmenorrhea    She will work with her gynecologist; considering endometrial ablation        Other   Prediabetes    Patient does not want to take metformin; she is willing to use injectable to help with glucose  control and weight loss; will try to get GLP-1 approved      Morbid obesity with BMI of 50.0-59.9, adult ([place]) - Primary    Discussed weight, eating habits, treatment with GLP-1 prescribed; will see her back in 6 weeks; I am  hopeful that as her anemia is treated, she'll have more energy and will be able to exercise more often      Relevant Medications   Liraglutide -Weight Management (SAXENDA) 18 MG/3ML SOPN   Lipoma of back    smooth without any hard areas; believe this is benign; will have her husband watch for growth      Elevated hemoglobin A1c    Single A1c of 6.5; thorough chart review could not find another glucose or A1c in the diabetes range; weight loss is key; she does not want metformin      Anemia, iron deficiency    Appointment with hematologist next week; I am hopeful that as she gets her anemia treated that she'll have more energy and feel like exercising       Other Visit Diagnoses    Acute bilateral thoracic back pain       this seems musculoskeletal; will try low dose muscle relaxer; weight loss may also help   Relevant Medications   cyclobenzaprine (FLEXERIL) 5 MG tablet       Follow up plan: Return in about 6 weeks (around 03/19/2017) for follow-up visit with Dr. Sanda Klein.  An after-visit summary was printed and given to the patient at [facility].  Please see the patient instructions which may contain other information and recommendations beyond what is mentioned above in the assessment and plan.  Meds ordered this encounter  Medications  . Liraglutide -Weight Management (SAXENDA) 18 MG/3ML SOPN    Sig: Inject 0.6 mg into the skin daily. x 1 week, then 1.2 mg daily x 1 week, then 1.8 mg daily x 1 week, then 2.4 mg daily x 1 week, then 3 mg daily    Dispense:  15 mL    Refill:  0    [facility]; I am willing to do prior auth if that is what it takes  . cyclobenzaprine (FLEXERIL) 5 MG tablet    Sig: Take 1 tablet (5 mg total) by mouth every 8 (eight) hours as  needed for muscle spasms.    Dispense:  30 tablet    Refill:  0    No orders of the defined types were placed in this encounter.

## 2017-02-05 NOTE — Assessment & Plan Note (Signed)
Patient does not want to take metformin; she is willing to use injectable to help with glucose control and weight loss; will try to get GLP-1 approved

## 2017-02-05 NOTE — Assessment & Plan Note (Signed)
Appointment with hematologist next week; I am hopeful that as she gets her anemia treated that she'll have more energy and feel like exercising

## 2017-02-05 NOTE — Assessment & Plan Note (Signed)
smooth without any hard areas; believe this is benign; will have her husband watch for growth

## 2017-02-09 ENCOUNTER — Telehealth: Payer: Self-pay | Admitting: Family Medicine

## 2017-02-09 MED ORDER — PEN NEEDLES 31G X 6 MM MISC: 1 refills | Status: DC

## 2017-02-09 NOTE — Telephone Encounter (Signed)
Copied from [place] #[id number]. Topic: General - Other >> Feb 09, 2017  9:26 AM Conception Chancy, NT wrote: Reason for CRM: pt is calling she was not prescribed pin needles with her RX. Contact pt when RX has been sent. Thank you

## 2017-02-09 NOTE — Telephone Encounter (Signed)
Called pharmacy confirmed that they received rx earlier for pen needles. Pharmacy tech Raquel Sarna states that they did receive RX however they are just out of stock. They have ordered the correct needles and should have them in tomorrow. Called pt and informed her of this. Pt gave verbal understanding.

## 2017-02-09 NOTE — Telephone Encounter (Signed)
done

## 2017-02-09 NOTE — Telephone Encounter (Signed)
Copied from [employer] #[id number]. Topic: Quick Communication - See Telephone Encounter >> Feb 09, 2017  4:28 PM Arletha Grippe wrote: CRM for notification. See Telephone encounter for:  02/09/17. [facility] [place] [facility] needles (Insulin Pen Needle (PEN NEEDLES) 31G X 6 MM MISC) but they are on special order and pt can not get them for several days.  Pt is asking for different rx to be called in for 31g by 5 ml.  those needles are in stock.  cb number for pt [phone]

## 2017-02-13 ENCOUNTER — Other Ambulatory Visit: Payer: Self-pay | Admitting: Hematology and Oncology

## 2017-02-13 ENCOUNTER — Inpatient Hospital Stay: Payer: BC Managed Care – PPO

## 2017-02-13 ENCOUNTER — Inpatient Hospital Stay: Payer: BC Managed Care – PPO | Attending: Hematology and Oncology | Admitting: Hematology and Oncology

## 2017-02-13 ENCOUNTER — Encounter: Payer: Self-pay | Admitting: Hematology and Oncology

## 2017-02-13 VITALS — BP 127/87 | HR 76 | Temp 96.8°F | Resp 18 | Ht 62.63 in | Wt 273.0 lb

## 2017-02-13 DIAGNOSIS — Z79899 Other long term (current) drug therapy: Secondary | ICD-10-CM | POA: Diagnosis not present

## 2017-02-13 DIAGNOSIS — R5382 Chronic fatigue, unspecified: Secondary | ICD-10-CM | POA: Insufficient documentation

## 2017-02-13 DIAGNOSIS — K5900 Constipation, unspecified: Secondary | ICD-10-CM | POA: Insufficient documentation

## 2017-02-13 DIAGNOSIS — R51 Headache: Secondary | ICD-10-CM | POA: Insufficient documentation

## 2017-02-13 DIAGNOSIS — Z880 Allergy status to penicillin: Secondary | ICD-10-CM | POA: Diagnosis not present

## 2017-02-13 DIAGNOSIS — Z794 Long term (current) use of insulin: Secondary | ICD-10-CM | POA: Diagnosis not present

## 2017-02-13 DIAGNOSIS — E119 Type 2 diabetes mellitus without complications: Secondary | ICD-10-CM | POA: Diagnosis not present

## 2017-02-13 DIAGNOSIS — Z800 Family history of malignant neoplasm of digestive organs: Secondary | ICD-10-CM | POA: Insufficient documentation

## 2017-02-13 DIAGNOSIS — D509 Iron deficiency anemia, unspecified: Secondary | ICD-10-CM | POA: Insufficient documentation

## 2017-02-13 DIAGNOSIS — M549 Dorsalgia, unspecified: Secondary | ICD-10-CM | POA: Insufficient documentation

## 2017-02-13 DIAGNOSIS — R0609 Other forms of dyspnea: Secondary | ICD-10-CM | POA: Insufficient documentation

## 2017-02-13 DIAGNOSIS — D649 Anemia, unspecified: Secondary | ICD-10-CM | POA: Insufficient documentation

## 2017-02-13 LAB — CBC WITH DIFFERENTIAL/PLATELET
BASOS PCT: 0 %
Basophils Absolute: 0 10*3/uL (ref 0–0.1)
EOS PCT: 1 %
Eosinophils Absolute: 0.1 10*3/uL (ref 0–0.7)
HEMATOCRIT: 32.3 % — AB (ref 35.0–47.0)
Hemoglobin: 10.1 g/dL — ABNORMAL LOW (ref 12.0–16.0)
Lymphocytes Relative: 43 %
Lymphs Abs: 3.4 10*3/uL (ref 1.0–3.6)
MCH: 19.9 pg — ABNORMAL LOW (ref 26.0–34.0)
MCHC: 31.3 g/dL — AB (ref 32.0–36.0)
MCV: 63.4 fL — ABNORMAL LOW (ref 80.0–100.0)
MONO ABS: 0.5 10*3/uL (ref 0.2–0.9)
MONOS PCT: 7 %
Neutro Abs: 3.8 10*3/uL (ref 1.4–6.5)
Neutrophils Relative %: 49 %
PLATELETS: 493 10*3/uL — AB (ref 150–440)
RBC: 5.09 MIL/uL (ref 3.80–5.20)
RDW: 19.7 % — AB (ref 11.5–14.5)
WBC: 7.8 10*3/uL (ref 3.6–11.0)

## 2017-02-13 LAB — RETICULOCYTES
RBC.: 5.04 MIL/uL (ref 3.80–5.20)
Retic Count, Absolute: 75.6 10*3/uL (ref 19.0–183.0)
Retic Ct Pct: 1.5 % (ref 0.4–3.1)

## 2017-02-13 LAB — FOLATE: Folate: 12.5 ng/mL (ref >5.9)

## 2017-02-13 LAB — URINALYSIS, COMPLETE (UACMP) WITH MICROSCOPIC
Bilirubin Urine: NEGATIVE
GLUCOSE, UA: NEGATIVE mg/dL
Hgb urine dipstick: NEGATIVE
Ketones, ur: NEGATIVE mg/dL
Nitrite: NEGATIVE
PH: 6 (ref 5.0–8.0)
Protein, ur: NEGATIVE mg/dL
Specific Gravity, Urine: 1.018 (ref 1.005–1.030)

## 2017-02-13 LAB — SEDIMENTATION RATE: SED RATE: 31 mm/h — AB (ref 0–20)

## 2017-02-13 LAB — VITAMIN B12: Vitamin B-12: 537 pg/mL (ref 180–914)

## 2017-02-13 NOTE — Progress Notes (Signed)
[facility] day:  02/13/2017  Chief Complaint: Sherri Robertson is a 31 y.o. female with anemia who is referred in consultation by Dr. Sanda Klein for assessment and management.  HPI:   Patient has been anemic "for awhile". Patient has been seen in the hematology clinic in the distant past by Dr. Inez Pilgrim. The cause of her anemia was undetermined per patient. She notes that she received blood transfusions at the age of 64 and 31. She was found to have an asymptomatic gastric ulcer and put on PPI therapy.   Labs on 02/02/2017 included a hematocrit 32.9, hemoglobin 9.7, MCV 65.3, platelets 447,000, white count 7700 with an [place] of 3658.  TSH was 1.59.  Creatinine was 0.75. Liver function tests were normal. Ferritin was 25 with an iron saturation of 7% and a TIBC of 305. Vitamin D, 25 hydroxy was 9 (30-100).  Review of  labs dating back to 08/04/2012 revealed a chronic anemia. Hematocrit has ranged between 29.7 - 33.5 and hemoglobin 8.2 -  10.5.  She has persistent microcytic indices (62.1 - 67).  Labs on 08/01/2015 revealed a ferritin of 34, iron saturation 6%, TIBC 298.  Folate was 10.5 with a B12 of 785.  Patient is fatigued. She eats well, and is actively attempting to lose weight. Patient eats meat and green leafy vegetables 3-4 times per week. She denies ice pica. She has, however had pica where she consumed body powder daily for over a year. Patient denies bleeding (hematochezia, melena, or vaginal bleeding). Her menses are normal and last for about 3 days. Patient has chronic daily headaches for which she effectively treats with APAP. She denies NSAID or ASA product use. Patient has exertional shortness of breath, however she states, "Being short of breath when I walk is normal for me".   Patient has taken oral iron supplements in the past, however patient states, "It makes my stomach hurt, and I burped the taste of it. I stopped it because of that".  There is  a positive familial history of cancers. Maternal grandmother had gastric cancer. Maternal grandfather had leukemia. Maternal aunt has sickle cell trait.    Past Medical History:  Diagnosis Date  . Anemia     Past Surgical History:  Procedure Laterality Date  . CESAREAN SECTION    . CHOLECYSTECTOMY    . TONSILECTOMY/ADENOIDECTOMY WITH MYRINGOTOMY      Family History  Problem Relation Age of Onset  . Hypertension Mother   . Depression Mother   . Hyperlipidemia Mother   . Depression Sister   . Anxiety disorder Sister   . Anxiety disorder Brother   . Eczema Daughter   . COPD Maternal Grandmother   . Emphysema Maternal Grandmother   . Cancer Maternal Grandfather        stomach cancer    Social History:  reports that she has never smoked. She has never used smokeless tobacco. She reports that she drinks alcohol. She reports that she does not use drugs.  Patient has never smoked. She drinks on a very infrequent basis. 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 Patient denies known exposures to radiation or toxins. She lives in [place].  The patient is alone today.   Allergies: No Known Allergies  Current Medications: Current Outpatient Prescriptions  Medication Sig Dispense Refill  . acetaminophen (TYLENOL) 325 MG tablet Take 325 mg by mouth every 6 (six) hours as needed.    . bismuth  subsalicylate (PEPTO BISMOL) 262 MG/15ML suspension Take 30 mLs by mouth every 6 (six) hours as needed.    Marland Kitchen ibuprofen (ADVIL,MOTRIN) 200 MG tablet Take 400 mg by mouth every 6 (six) hours as needed.    . Insulin Pen Needle (PEN NEEDLES) 31G X 6 MM MISC For use with daily pen injectable; subcutaneously; once a day 100 each 1  . Liraglutide -Weight Management (SAXENDA) 18 MG/3ML SOPN Inject 0.6 mg into the skin daily. x 1 week, then 1.2 mg daily x 1 week, then 1.8 mg daily x 1 week, then 2.4 mg daily x 1 week, then 3 mg daily 15 mL 0  . Vitamin D, Ergocalciferol, (DRISDOL) 50000 units  CAPS capsule Take 1 capsule (50,000 Units total) by mouth every 7 (seven) days. 4 capsule 1  . cyclobenzaprine (FLEXERIL) 5 MG tablet Take 1 tablet (5 mg total) by mouth every 8 (eight) hours as needed for muscle spasms. (Patient not taking: Reported on 02/13/2017) 30 tablet 0   No current facility-administered medications for this visit.     Review of Systems:  GENERAL:  Always tired.  No fevers, sweats or weight loss. PERFORMANCE STATUS (ECOG):  1 HEENT:  No visual changes, runny nose, sore throat, mouth sores or tenderness. Lungs:  Shortness of breath with walking.  No cough.  No hemoptysis. Cardiac:  No chest pain, palpitations, orthopnea, or PND. GI:  No nausea, vomiting, diarrhea, constipation, melena or hematochezia. GU:  No urgency, frequency, dysuria, or hematuria.  Menses 3 days each month and "nromal" (not heavy). Musculoskeletal:  Back pain felt secondary to weight.  No joint pain.  No muscle tenderness. Extremities:  No pain or swelling. Skin:  No rashes or skin changes. Neuro:  Headache.  No numbness or weakness, balance or coordination issues. Endocrine:  Diabetes.  No thyroid issues, hot flashes or night sweats. Psych:  No mood changes, depression or anxiety. Pain:  No focal pain. Review of systems:  All other systems reviewed and found to be negative.  Physical Exam: Blood pressure 127/87, pulse 76, temperature (!) 96.8 F (36 C), temperature source Tympanic, resp. rate 18, height 5' 2.63" (1.591 m), weight 273 lb (123.8 kg), last menstrual period 02/04/2017. GENERAL:  Well developed, well nourished, heavyset woman sitting comfortably in the exam room in no acute distress. MENTAL STATUS:  Alert and oriented to person, place and time. HEAD:  Brown short hair with braids.  Normocephalic, atraumatic, face symmetric, no Cushingoid features. EYES:  Brown eyes.  Pupils equal round and reactive to light and accomodation.  No conjunctivitis or scleral icterus. ENT:  Oropharynx  clear without lesion.  Tongue normal. Mucous membranes moist.  RESPIRATORY:  Clear to auscultation without rales, wheezes or rhonchi. CARDIOVASCULAR:  Regular rate and rhythm without murmur, rub or gallop. ABDOMEN:  Soft, non-tender, with active bowel sounds, and no appreciable hepatosplenomegaly.  No masses. SKIN:  Right shoulder fairy tattoo.  No rashes, ulcers or lesions. EXTREMITIES: No edema, no skin discoloration or tenderness.  No palpable cords. LYMPH NODES: No palpable cervical, supraclavicular, axillary or inguinal adenopathy  NEUROLOGICAL: Unremarkable. PSYCH:  Appropriate.   No visits with results within 3 Day(s) from this visit.  Latest known visit with results is:  Office Visit on 01/29/2017  Component Date Value Ref Range Status  . Color, UA 01/29/2017 dark yellow   Final  . Clarity, UA 01/29/2017 clear   Final  . Glucose, UA 01/29/2017 neg   Final  . Bilirubin, UA 01/29/2017 neg  Final  . Ketones, UA 01/29/2017 trace   Final  . Spec Grav, UA 01/29/2017 1.020  1.010 - 1.025 Final  . Blood, UA 01/29/2017 neg   Final  . pH, UA 01/29/2017 6.5  5.0 - 8.0 Final  . Protein, UA 01/29/2017 trace   Final  . Urobilinogen, UA 01/29/2017 0.2  0.2 or 1.0 E.U./dL Final  . Nitrite, UA 01/29/2017 neg   Final  . Leukocytes, UA 01/29/2017 Negative  Negative Final  . MICRO NUMBER: 01/29/2017 [id number]   Final  . SPECIMEN QUALITY: 01/29/2017 ADEQUATE   Final  . Sample Source 01/29/2017 URINE   Final  . STATUS: 01/29/2017 FINAL   Final  . Result: 01/29/2017 Multiple organisms present, each less than 10,000 CFU/mL. These organisms, commonly found on external and internal genitalia, are considered to be colonizers. No further testing performed.   Final    Assessment:  Naphtali MALIK RUFFINO is a 31 y.o. female with a history of chronic microcytic anemia likely due to iron deficiency.  Labs dating back to 08/04/2012 revealed a hematocrit that has ranged between 29.7 - 33.5 and hemoglobin 8.2 -   10.5.  She has persistent microcytic indices (62.1 - 67).  Iron saturation have been low (6%).  B12 and folate have been normal.  Diet appears good.  She has a history of powder pica.  She has been intolerant of oral iron in the past.  Labs on 02/02/2017 included a hematocrit 32.9, hemoglobin 9.7, and MCV 65.3.  Normal labs included:  TSH, creatinine, and LFTs.  Ferritin was 25 with an iron saturation of 7% and a TIBC of 305. Vitamin D, 25 hydroxy was low.  She denies any prior EGD or colonoscopy.  She was diagnosed with an asymptomatic ulcer at age 70.  She required PRBC transfusion at age 43 and 25.  Symptomatically, she is chronically fatigued.  Exam is unremarkable.  Plan: 1.  Labs today:  CBC with diff, retic, B12, folate, sed rate, guaiac cards x 3, urinalysis, Hgb electrophoresis . 2.  Discuss anemia and management of iron deficiency anemia.  Discuss diet, oral iron and IV iron.  Goal ferritin 100. 3.  Preauth Venofer. 4.  RTC in 1 week for MD assessment, review of labs, and +/- Venofer   Honor Loh, NP  02/13/2017, 10:24 AM   I saw and evaluated the patient, participating in the key portions of the service and reviewing pertinent diagnostic studies and records.  I reviewed the nurse practitioner's note and agree with the findings and the plan.  The assessment and plan were discussed with the patient.  A few questions were asked by the patient and answered.   Nolon Stalls, MD 02/13/2017, 10:24 AM

## 2017-02-16 LAB — HEMOGLOBINOPATHY EVALUATION
HGB C: 0 %
HGB S QUANTITAION: 0 %
HGB VARIANT: 0 %
Hgb A2 Quant: 1.5 % — ABNORMAL LOW (ref 1.8–3.2)
Hgb A: 98.5 % (ref 96.4–98.8)
Hgb F Quant: 0 % (ref 0.0–2.0)

## 2017-02-20 ENCOUNTER — Other Ambulatory Visit: Payer: Self-pay | Admitting: Hematology and Oncology

## 2017-02-20 ENCOUNTER — Other Ambulatory Visit: Payer: Self-pay | Admitting: Oncology

## 2017-02-20 ENCOUNTER — Telehealth: Payer: Self-pay | Admitting: Oncology

## 2017-02-20 ENCOUNTER — Inpatient Hospital Stay: Payer: BC Managed Care – PPO

## 2017-02-20 ENCOUNTER — Inpatient Hospital Stay (HOSPITAL_BASED_OUTPATIENT_CLINIC_OR_DEPARTMENT_OTHER): Payer: BC Managed Care – PPO | Admitting: Hematology and Oncology

## 2017-02-20 ENCOUNTER — Other Ambulatory Visit: Payer: Self-pay | Admitting: *Deleted

## 2017-02-20 VITALS — BP 117/82 | HR 79 | Temp 97.0°F | Resp 18

## 2017-02-20 VITALS — BP 112/75 | HR 94 | Temp 97.0°F | Resp 18 | Wt 276.2 lb

## 2017-02-20 DIAGNOSIS — E119 Type 2 diabetes mellitus without complications: Secondary | ICD-10-CM

## 2017-02-20 DIAGNOSIS — M549 Dorsalgia, unspecified: Secondary | ICD-10-CM

## 2017-02-20 DIAGNOSIS — R51 Headache: Secondary | ICD-10-CM | POA: Diagnosis not present

## 2017-02-20 DIAGNOSIS — D509 Iron deficiency anemia, unspecified: Secondary | ICD-10-CM

## 2017-02-20 DIAGNOSIS — Z79899 Other long term (current) drug therapy: Secondary | ICD-10-CM

## 2017-02-20 DIAGNOSIS — R0609 Other forms of dyspnea: Secondary | ICD-10-CM | POA: Diagnosis not present

## 2017-02-20 DIAGNOSIS — K5900 Constipation, unspecified: Secondary | ICD-10-CM

## 2017-02-20 DIAGNOSIS — Z794 Long term (current) use of insulin: Secondary | ICD-10-CM | POA: Diagnosis not present

## 2017-02-20 DIAGNOSIS — R5382 Chronic fatigue, unspecified: Secondary | ICD-10-CM

## 2017-02-20 DIAGNOSIS — Z800 Family history of malignant neoplasm of digestive organs: Secondary | ICD-10-CM | POA: Diagnosis not present

## 2017-02-20 DIAGNOSIS — D508 Other iron deficiency anemias: Secondary | ICD-10-CM

## 2017-02-20 MED ORDER — IRON SUCROSE 20 MG/ML IV SOLN
200.0000 mg | Freq: Once | INTRAVENOUS | Status: AC
  Administered 2017-02-20: 200 mg via INTRAVENOUS
  Filled 2017-02-20: qty 10

## 2017-02-20 MED ORDER — SIMETHICONE 80 MG PO CHEW: 80.0000 mg | CHEWABLE_TABLET | Freq: Four times a day (QID) | ORAL | 0 refills | Status: DC | PRN

## 2017-02-20 MED ORDER — OMEPRAZOLE 20 MG PO CPDR: 20.0000 mg | DELAYED_RELEASE_CAPSULE | Freq: Every day | ORAL | 0 refills | Status: DC

## 2017-02-20 MED ORDER — SODIUM CHLORIDE 0.9 % IV SOLN
Freq: Once | INTRAVENOUS | Status: AC
  Administered 2017-02-20: 14:00:00 via INTRAVENOUS
  Filled 2017-02-20: qty 1000

## 2017-02-20 NOTE — Progress Notes (Signed)
[facility] day:  02/20/2017  Chief Complaint: Sherri [place] is a 31 y.o. female with anemia who is seen for review of work-up and discussion regarding direction of therapy.  HPI:   The patient was last seen in the hematology clinic on 02/13/2017.  At that time, she was seen for initial consultation.  She had a history of chronic microcytic anemia likely due to iron deficiency.  She had a history of an asymptomatic ulcer at age 7.  Diet appeared good.  She was intolerant of oral iron.  Labs on 02/13/2017 revealed a hematocrit 32.3, hemoglobin 10.1, MCV 63.4, platelets 493,000, white count 7800 with an ANC of 3800.  Reticulocyte count was 1.5%. B12 was 537.  Folate was 12.5. Sedimentation rate was 31.  Urinalysis revealed no hemoglobin and 0-5 red cells.  Guaiac cards are pending.  Hemoglobin electrophoresis revealed a normal adult hemoglobin.  Hemoglobin A2 was 1.5% and hemoglobin A 98.5%.  Low hemoglobin A2 may indicate an alpha thalassemia or iron deficiency.  Symptomatically, patient is constipated. She is on Saxenda, which is known to cause constipation. Patient has not returned guaiac cards. Patient denies further physical complaints today. She has had no interval infections. Patient eating well with no demonstrated weight loss.    Past Medical History:  Diagnosis Date  . Anemia     Past Surgical History:  Procedure Laterality Date  . CESAREAN SECTION    . CHOLECYSTECTOMY    . TONSILECTOMY/ADENOIDECTOMY WITH MYRINGOTOMY      Family History  Problem Relation Age of Onset  . Hypertension Mother   . Depression Mother   . Hyperlipidemia Mother   . Depression Sister   . Anxiety disorder Sister   . Anxiety disorder Brother   . Eczema Daughter   . COPD Maternal Grandmother   . Emphysema Maternal Grandmother   . Cancer Maternal Grandfather        stomach cancer    Social History:  reports that  has never smoked. she has never  used smokeless tobacco. She reports that she drinks alcohol. She reports that she does not use drugs.  Patient has never smoked. She drinks on a very infrequent basis. 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 Patient denies known exposures to radiation or toxins. She [place] in [place]  The patient is accompanied by her baby sister, [place], today.   Allergies: No Known Allergies  Current Medications: Current Outpatient Medications  Medication Sig Dispense Refill  . acetaminophen (TYLENOL) 325 MG tablet Take 325 mg by mouth every 6 (six) hours as needed.    . bismuth subsalicylate (PEPTO BISMOL) 262 MG/15ML suspension Take 30 mLs by mouth every 6 (six) hours as needed.    Marland Kitchen ibuprofen (ADVIL,MOTRIN) 200 MG tablet Take 400 mg by mouth every 6 (six) hours as needed.    . Insulin Pen Needle (PEN NEEDLES) 31G X 6 MM MISC For use with daily pen injectable; subcutaneously; once a day 100 each 1  . Liraglutide -Weight Management (SAXENDA) 18 MG/3ML SOPN Inject 0.6 mg into the skin daily. x 1 week, then 1.2 mg daily x 1 week, then 1.8 mg daily x 1 week, then 2.4 mg daily x 1 week, then 3 mg daily 15 mL 0  . Vitamin D, Ergocalciferol, (DRISDOL) 50000 units CAPS capsule Take 1 capsule (50,000 Units total) by mouth every 7 (seven) days. 4 capsule 1  . cyclobenzaprine (FLEXERIL) 5 MG tablet Take  1 tablet (5 mg total) by mouth every 8 (eight) hours as needed for muscle spasms. (Patient not taking: Reported on 02/20/2017) 30 tablet 0   No current facility-administered medications for this visit.     Review of Systems:  GENERAL:  Always tired.  No fevers or sweats.  Weight up 3 pounds. PERFORMANCE STATUS (ECOG):  1 HEENT:  No visual changes, runny nose, sore throat, mouth sores or tenderness. Lungs:  Shortness of breath with walking.  No cough.  No hemoptysis. Cardiac:  No chest pain, palpitations, orthopnea, or PND. GI:  Constipation (see HPI).  No nausea, vomiting, diarrhea, melena or  hematochezia. GU:  No urgency, frequency, dysuria, or hematuria.  Menses 3 days each month and "nromal" (not heavy). Musculoskeletal:  Back pain felt secondary to weight.  No joint pain.  No muscle tenderness. Extremities:  No pain or swelling. Skin:  No rashes or skin changes. Neuro:  Headache.  No numbness or weakness, balance or coordination issues. Endocrine:  Diabetes.  No thyroid issues, hot flashes or night sweats. Psych:  No mood changes, depression or anxiety. Pain:  No focal pain. Review of systems:  All other systems reviewed and found to be negative.  Physical Exam: Blood pressure 112/75, pulse 94, temperature (!) 97 F (36.1 C), temperature source Tympanic, resp. rate 18, weight 276 lb 3 oz (125.3 kg), last menstrual period 02/04/2017. GENERAL:  Well developed, well nourished, heavyset woman sitting comfortably in the exam room in no acute distress. MENTAL STATUS:  Alert and oriented to person, place and time. HEAD:  Brown short hair with braids.  Normocephalic, atraumatic, face symmetric, no Cushingoid features. EYES:  Brown eyes.  No conjunctivitis or scleral icterus. RESPIRATORY:  Clear to auscultation without rales, wheezes or rhonchi. CARDIOVASCULAR:  Regular rate and rhythm without murmur, rub or gallop. NEUROLOGICAL: Unremarkable. PSYCH:  Appropriate.   No visits with results within 3 Day(s) from this visit.  Latest known visit with results is:  Appointment on 02/13/2017  Component Date Value Ref Range Status  . WBC 02/13/2017 7.8  3.6 - 11.0 K/uL Final  . RBC 02/13/2017 5.09  3.80 - 5.20 MIL/uL Final  . Hemoglobin 02/13/2017 10.1* 12.0 - 16.0 g/dL Final  . HCT 02/13/2017 32.3* 35.0 - 47.0 % Final  . MCV 02/13/2017 63.4* 80.0 - 100.0 fL Final  . MCH 02/13/2017 19.9* 26.0 - 34.0 pg Final  . MCHC 02/13/2017 31.3* 32.0 - 36.0 g/dL Final  . RDW 02/13/2017 19.7* 11.5 - 14.5 % Final  . Platelets 02/13/2017 493* 150 - 440 K/uL Final  . Neutrophils Relative %  02/13/2017 49  % Final  . Neutro Abs 02/13/2017 3.8  1.4 - 6.5 K/uL Final  . Lymphocytes Relative 02/13/2017 43  % Final  . Lymphs Abs 02/13/2017 3.4  1.0 - 3.6 K/uL Final  . Monocytes Relative 02/13/2017 7  % Final  . Monocytes Absolute 02/13/2017 0.5  0.2 - 0.9 K/uL Final  . Eosinophils Relative 02/13/2017 1  % Final  . Eosinophils Absolute 02/13/2017 0.1  0 - 0.7 K/uL Final  . Basophils Relative 02/13/2017 0  % Final  . Basophils Absolute 02/13/2017 0.0  0 - 0.1 K/uL Final  . Retic Ct Pct 02/13/2017 1.5  0.4 - 3.1 % Final  . RBC. 02/13/2017 5.04  3.80 - 5.20 MIL/uL Final  . Retic Count, Absolute 02/13/2017 75.6  19.0 - 183.0 K/uL Final  . Vitamin B-12 02/13/2017 537  180 - 914 pg/mL Final   Comment: (NOTE)  This assay is not validated for testing neonatal or myeloproliferative syndrome specimens for Vitamin B12 levels. Performed at [facility], [place] [address] [place], [place] [address]   . Folate 02/13/2017 12.5  >5.9 ng/mL Final  . Sed Rate 02/13/2017 31* 0 - 20 mm/hr Final  . Hgb A2 Quant 02/13/2017 1.5* 1.8 - 3.2 % Final  . Hgb F Quant 02/13/2017 0.0  0.0 - 2.0 % Final  . Hgb S Quant 02/13/2017 0.0  0.0 % Final  . Hgb C 02/13/2017 0.0  0.0 % Corrected  . Hgb A 02/13/2017 98.5  96.4 - 98.8 % Final  . Hgb Variant 02/13/2017 0.0  0.0 % Corrected  . Please Note: 02/13/2017 Comment   Corrected   Comment: (NOTE) Normal adult hemoglobin present. Low Hgb A2 may indicate an alpha-thalassemia or iron deficiency. Suggest clinical and hematologic correlation. Performed At: [facility] [place], [place] [id number] Rush Farmer MD JH:[id number]   . Color, Urine 02/13/2017 YELLOW* YELLOW Final  . APPearance 02/13/2017 HAZY* CLEAR Final  . Specific Gravity, Urine 02/13/2017 1.018  1.005 - 1.030 Final  . pH 02/13/2017 6.0  5.0 - 8.0 Final  . Glucose, UA 02/13/2017 NEGATIVE  NEGATIVE mg/dL Final  . Hgb urine dipstick 02/13/2017 NEGATIVE  NEGATIVE Final   . Bilirubin Urine 02/13/2017 NEGATIVE  NEGATIVE Final  . Ketones, ur 02/13/2017 NEGATIVE  NEGATIVE mg/dL Final  . Protein, ur 02/13/2017 NEGATIVE  NEGATIVE mg/dL Final  . Nitrite 02/13/2017 NEGATIVE  NEGATIVE Final  . Leukocytes, UA 02/13/2017 LARGE* NEGATIVE Final  . RBC / HPF 02/13/2017 0-5  0 - 5 RBC/hpf Final  . WBC, UA 02/13/2017 6-30  0 - 5 WBC/hpf Final  . Bacteria, UA 02/13/2017 RARE* NONE SEEN Final  . Squamous Epithelial / LPF 02/13/2017 6-30* NONE SEEN Final  . Mucus 02/13/2017 PRESENT   Final    Assessment:  Waylynn BYRON TIPPING is a 31 y.o. female with a history of chronic microcytic anemia due to iron deficiency.  Labs dating back to 08/04/2012 revealed a hematocrit that has ranged between 29.7 - 33.5 and hemoglobin 8.2 -  10.5.  She has persistent microcytic indices (62.1 - 67).  Iron saturation have been low (6%).  B12 and folate have been normal.  Diet appears good.  She has a history of powder pica.  She has been intolerant of oral iron in the past.  Labs on 02/02/2017 included a hematocrit 32.9, hemoglobin 9.7, and MCV 65.3.  Normal labs included:  TSH, creatinine, and LFTs.  Ferritin was 25 with an iron saturation of 7% and a TIBC of 305. Vitamin D, 25 hydroxy was low.  Labs on 02/13/2017 revealed low reticulocyte count(1.5%).  B12 and folate were normal. Sedimentation rate was 31.  Urinalysis revealed no hematuria.  Hemoglobin electrophoresis revealed a normal adult hemoglobin.  Hemoglobin A2 was 1.5% and hemoglobin A 98.5%.  Low hemoglobin A2 may indicate an alpha thalassemia or iron deficiency. Guaiac cards are pending.   She denies any prior EGD or colonoscopy.  She was diagnosed with an asymptomatic ulcer at age 61.  She required PRBC transfusion at age 48 and 57.  Symptomatically, she is chronically fatigued.  Exam is unremarkable.   Plan: 1.  Review labs from 02/02/2017.  Hemoglobin electrophoresis normal.  Likely isolated iron deficiency, but possible thalassesmia.   Assess response to repleting iron stores (MCV and ferritin). 2.  Follow-up guaiac cards. 3.  Begin weekly Venofer x 3. 4.  RTC  in 6 weeks for MD assessment, labs (CBC with diff, ferritin-day before) and +/- Venofer.   Honor Loh, NP  02/20/2017, 2:04 PM   I saw and evaluated the patient, participating in the key portions of the service and reviewing pertinent diagnostic studies and records.  I reviewed the nurse practitioner's note and agree with the findings and the plan.  The assessment and plan were discussed with the patient.  A few questions were asked by the patient and answered.   Nolon Stalls, MD 02/20/2017, 2:04 PM

## 2017-02-20 NOTE — Progress Notes (Signed)
Patient here today for lab results. 

## 2017-02-20 NOTE — Telephone Encounter (Signed)
Patient called answering service reporting feeling abdominal pain.  Called patient. She has iron deficiency anemia and follows up with Dr.Corcoran. She had one IV Venofer infusion today. She tolerated well without any reaction. She went home and took a nap and when she woke up she started to have stomach pain, as well as left upper quadrant intermittent pain. She wonders if this is related to venofer.  Talked to patient that abdominal pain can be a side effect of venofer, but not common. I noticed she take ibuprofen PRN and also have a history of Stomach ulcer. I advice her to avoid any NSAIDs and sent omeprazole 20mg  daily and Gas-X. If no improvement or symptoms worsen, she will call me again. Patient voices understanding.

## 2017-02-22 ENCOUNTER — Encounter: Payer: Self-pay | Admitting: Hematology and Oncology

## 2017-02-23 ENCOUNTER — Ambulatory Visit: Payer: Self-pay | Admitting: *Deleted

## 2017-02-23 NOTE — Telephone Encounter (Signed)
Patient needs to find out what she can use to keep bowels regular- what is safe to use with the medications that she is currently using. She still feels abdominal pressure like she needs to move bowels. Please let patient know.  Reason for Disposition . Taking new prescription medication  Answer Assessment - Initial Assessment Questions 1. STOOL PATTERN OR FREQUENCY: "How often do you pass bowel movements (BMs)?"  (Normal range: tid to q 3 days)  "When was the last BM passed?"       Normally every day to every other day- last BM Saturday. 2. STRAINING: "Do you have to strain to have a BM?"      Normally no- had BM- patient took Ducalax and had BM- patient feels that she still needs to empty. Patient was hurting and feeling nauseous- she started the Ducalax. She has taken all 10 of the pills in the box. Miralax has not helped at all. Patient used that before she tried the Ducalax. 3. RECTAL PAIN: "Does your rectum hurt when the stool comes out?" If so, ask: "Do you have hemorrhoids? How bad is the pain?"  (Scale 1-10; or mild, moderate, severe)     Patient is not having rectal pain, no hemorrhoids,patient is having stomach pain 4. STOOL COMPOSITION: "Are the stools hard?"      Stool is not hard 5. BLOOD ON STOOLS: "Has there been any blood on the toilet tissue or on the surface of the BM?" If so, ask: "When was the last time?"      No blood 6. CHRONIC CONSTIPATION: "Is this a new problem for you?"  If no, ask: How long have you had this problem?" (days, weeks, months)      Constipation is a new problem- she is doing Iron Infusions and  Saxenda. Patient likes the Kirke Shaggy and she feels that it is working- she does not want to stop it. 7. CHANGES IN DIET: "Have there been any recent changes in your diet?"      Eating less- trying to eat salads trying to drink water 8. MEDICATIONS: "Have you been taking any new medications?"     Saxenda- maybe causing 9. LAXATIVES: "Have you been using any  laxatives or enemas?"  If yes, ask "What, how often, and when was the last time?"     Miralax and ducalax and Ducalax worked better 10. CAUSE: "What do you think is causing the constipation?"        New medication maybe cause- but doesn't want to stop 11. OTHER SYMPTOMS: "Do you have any other symptoms?" (e.g., abdominal pain, fever, vomiting)       Abdominal cramping 12. PREGNANCY: "Is there any chance you are pregnant?" "When was your last menstrual period?"       No- just finished cycle  Protocols used: CONSTIPATION-A-AH

## 2017-02-23 NOTE — Telephone Encounter (Signed)
Pt.notified

## 2017-02-23 NOTE — Telephone Encounter (Signed)
Please encourage the patient to increase water intake, enough to keep her urine pale yellow Increase fiber intake to 25 grams of fiber daily Use Miralax daily, every single day appt if not improving

## 2017-02-24 ENCOUNTER — Encounter (INDEPENDENT_AMBULATORY_CARE_PROVIDER_SITE_OTHER): Payer: Self-pay

## 2017-02-24 ENCOUNTER — Inpatient Hospital Stay: Payer: BC Managed Care – PPO

## 2017-02-24 VITALS — BP 115/76 | HR 84

## 2017-02-24 DIAGNOSIS — D509 Iron deficiency anemia, unspecified: Secondary | ICD-10-CM | POA: Diagnosis not present

## 2017-02-24 MED ORDER — SODIUM CHLORIDE 0.9 % IV SOLN
Freq: Once | INTRAVENOUS | Status: AC
  Administered 2017-02-24: 15:00:00 via INTRAVENOUS
  Filled 2017-02-24: qty 1000

## 2017-02-24 MED ORDER — IRON SUCROSE 20 MG/ML IV SOLN
200.0000 mg | Freq: Once | INTRAVENOUS | Status: AC
  Administered 2017-02-24: 200 mg via INTRAVENOUS
  Filled 2017-02-24: qty 10

## 2017-03-03 ENCOUNTER — Inpatient Hospital Stay: Payer: BC Managed Care – PPO

## 2017-03-03 ENCOUNTER — Other Ambulatory Visit: Payer: Self-pay | Admitting: Hematology and Oncology

## 2017-03-03 VITALS — BP 117/79 | HR 94 | Temp 97.7°F | Resp 18

## 2017-03-03 DIAGNOSIS — D509 Iron deficiency anemia, unspecified: Secondary | ICD-10-CM | POA: Diagnosis not present

## 2017-03-03 MED ORDER — SODIUM CHLORIDE 0.9 % IV SOLN
200.0000 mg | Freq: Once | INTRAVENOUS | Status: DC
  Filled 2017-03-03: qty 10

## 2017-03-03 MED ORDER — SODIUM CHLORIDE 0.9 % IV SOLN
Freq: Once | INTRAVENOUS | Status: AC
  Administered 2017-03-03: 14:00:00 via INTRAVENOUS
  Filled 2017-03-03: qty 1000

## 2017-03-03 MED ORDER — IRON SUCROSE 20 MG/ML IV SOLN
200.0000 mg | Freq: Once | INTRAVENOUS | Status: AC
  Administered 2017-03-03: 200 mg via INTRAVENOUS
  Filled 2017-03-03: qty 10

## 2017-03-10 ENCOUNTER — Inpatient Hospital Stay: Payer: BC Managed Care – PPO

## 2017-03-10 VITALS — BP 120/84 | HR 86 | Temp 97.1°F | Resp 16 | Wt 270.0 lb

## 2017-03-10 DIAGNOSIS — D509 Iron deficiency anemia, unspecified: Secondary | ICD-10-CM

## 2017-03-10 MED ORDER — SODIUM CHLORIDE 0.9 % IV SOLN
Freq: Once | INTRAVENOUS | Status: AC
  Administered 2017-03-10: 14:00:00 via INTRAVENOUS
  Filled 2017-03-10: qty 1000

## 2017-03-10 MED ORDER — IRON SUCROSE 20 MG/ML IV SOLN
200.0000 mg | Freq: Once | INTRAVENOUS | Status: AC
  Administered 2017-03-10: 200 mg via INTRAVENOUS
  Filled 2017-03-10: qty 10

## 2017-03-11 ENCOUNTER — Encounter: Payer: BC Managed Care – PPO | Admitting: Obstetrics and Gynecology

## 2017-03-12 ENCOUNTER — Other Ambulatory Visit: Payer: Self-pay | Admitting: Family Medicine

## 2017-03-12 MED ORDER — LIRAGLUTIDE -WEIGHT MANAGEMENT 18 MG/3ML ~~LOC~~ SOPN: 3.0000 mg | PEN_INJECTOR | Freq: Every day | SUBCUTANEOUS | 0 refills | Status: DC

## 2017-03-12 NOTE — Telephone Encounter (Signed)
Pt has an appt Dec 6th

## 2017-03-12 NOTE — Telephone Encounter (Signed)
Copied from [place]. Topic: Quick Communication - Rx Refill/Question >> Mar 12, 2017  1:04 PM Patrice Paradise wrote: Has the patient contacted their pharmacy? Yes - Patient is requesting a refill on her  Liraglutide -Weight Management (SAXENDA) 18 MG/3ML SOPN   (Agent: If no, request that the patient contact the pharmacy for the refill.)  Preferred Pharmacy (with phone number or street name):  [id number] - [place], [place] [address] [place] [address] Phone: [phone] Fax: [phone]   Agent: Please be advised that RX refills may take up to 48 hours. We ask that you follow-up with your pharmacy.

## 2017-03-19 ENCOUNTER — Other Ambulatory Visit: Payer: Self-pay | Admitting: Oncology

## 2017-03-19 ENCOUNTER — Ambulatory Visit: Payer: BC Managed Care – PPO | Admitting: Family Medicine

## 2017-03-19 ENCOUNTER — Encounter: Payer: Self-pay | Admitting: Family Medicine

## 2017-03-19 DIAGNOSIS — D509 Iron deficiency anemia, unspecified: Secondary | ICD-10-CM | POA: Diagnosis not present

## 2017-03-19 MED ORDER — LIRAGLUTIDE -WEIGHT MANAGEMENT 18 MG/3ML ~~LOC~~ SOPN: 3.0000 mg | PEN_INJECTOR | Freq: Every day | SUBCUTANEOUS | 5 refills | Status: DC

## 2017-03-19 NOTE — Progress Notes (Signed)
BP 118/78   Pulse 83   Temp 98 F (36.7 C) (Oral)   Resp 18   Wt 271 lb (122.9 kg)   SpO2 99%   BMI 48.57 kg/m    Subjective:    [name], female    DOB: 1985-09-26, 31 y.o.   MRN: [id number]  HPI: [place]ri [place] is a 31 y.o. female  Chief Complaint  Patient presents with  . Follow-up    HPI Patient is here for follow-up of morbid obesity  She has lost 5 pounds with Saxenda; no abdominal pain No bad nausea, can tell she cannot overeat Cooked a burger yesterday, fixed herself two of them; she ended just eating one, then something told her not to get the second burger She is only eating half of her portions, happy and feels satisfied Bringing salads to work but it's a battle if she eats it; not really hungry Felt trembly and noticed her sugar dropped, had apple juice and did fine She walks on her job a lot; takes the stairs instead of the elevator She has a free year of weight loss program, more fruits and veggies, Noom Saxenda couch calls her every week Working on her injection sites  Still tired, anemic She did the iron injections for 5 weeks Had some abdominal pain after the first iron infusion; now on omeprazole Going to do stool cards Seeing hematologist; she is not convinced that her periods are the cause; periods might last 3 days  Depression screen [facility] 2/9 01/22/2017 08/01/2015 01/26/2015  Decreased Interest 0 0 0  Down, Depressed, Hopeless 0 0 0  PHQ - 2 Score 0 0 0    Relevant past medical, surgical, family and social history reviewed Past Medical History:  Diagnosis Date  . Anemia    Past Surgical History:  Procedure Laterality Date  . CESAREAN SECTION    . CHOLECYSTECTOMY    . TONSILECTOMY/ADENOIDECTOMY WITH MYRINGOTOMY     Family History  Problem Relation Age of Onset  . Hypertension Mother   . Depression Mother   . Hyperlipidemia Mother   . Depression Sister   . Anxiety disorder Sister   . Anxiety disorder Brother     . Eczema Daughter   . COPD Maternal Grandmother   . Emphysema Maternal Grandmother   . Cancer Maternal Grandfather        stomach cancer   Social History   Tobacco Use  . Smoking status: Never Smoker  . Smokeless tobacco: Never Used  Substance Use Topics  . Alcohol use: Yes    Comment: occassional  . Drug use: No    Interim medical history since last visit reviewed. Allergies and medications reviewed  Review of Systems Per HPI unless specifically indicated above     Objective:    BP 118/78   Pulse 83   Temp 98 F (36.7 C) (Oral)   Resp 18   Wt 271 lb (122.9 kg)   SpO2 99%   BMI 48.57 kg/m   Wt Readings from Last 3 Encounters:  03/19/17 271 lb (122.9 kg)  03/10/17 270 lb (122.5 kg)  02/20/17 276 lb 3 oz (125.3 kg)    Physical Exam  Constitutional: She appears well-developed and well-nourished.  Morbidly obese, but weight down 5 pounds net over last 4 weeks  HENT:  Mouth/Throat: Mucous membranes are normal.  Eyes: EOM are normal. No scleral icterus.  Cardiovascular: Normal rate and regular rhythm.  Pulmonary/Chest: Effort normal and breath sounds  normal.  Abdominal: She exhibits no distension. There is no tenderness.  Psychiatric: She has a normal mood and affect. Her behavior is normal.    Results for orders placed or performed in visit on 02/13/17  CBC with Differential/Platelet  Result Value Ref Range   WBC 7.8 3.6 - 11.0 K/uL   RBC 5.09 3.80 - 5.20 MIL/uL   Hemoglobin 10.1 (L) 12.0 - 16.0 g/dL   HCT 32.3 (L) 35.0 - 47.0 %   MCV 63.4 (L) 80.0 - 100.0 fL   MCH 19.9 (L) 26.0 - 34.0 pg   MCHC 31.3 (L) 32.0 - 36.0 g/dL   RDW 19.7 (H) 11.5 - 14.5 %   Platelets 493 (H) 150 - 440 K/uL   Neutrophils Relative % 49 %   Neutro Abs 3.8 1.4 - 6.5 K/uL   Lymphocytes Relative 43 %   Lymphs Abs 3.4 1.0 - 3.6 K/uL   Monocytes Relative 7 %   Monocytes Absolute 0.5 0.2 - 0.9 K/uL   Eosinophils Relative 1 %   Eosinophils Absolute 0.1 0 - 0.7 K/uL   Basophils  Relative 0 %   Basophils Absolute 0.0 0 - 0.1 K/uL  Reticulocytes  Result Value Ref Range   Retic Ct Pct 1.5 0.4 - 3.1 %   RBC. 5.04 3.80 - 5.20 MIL/uL   Retic Count, Absolute 75.6 19.0 - 183.0 K/uL  Vitamin B12  Result Value Ref Range   Vitamin B-12 537 180 - 914 pg/mL  Folate  Result Value Ref Range   Folate 12.5 >5.9 ng/mL  Sedimentation rate  Result Value Ref Range   Sed Rate 31 (H) 0 - 20 mm/hr  Hemoglobinopathy evaluation  Result Value Ref Range   Hgb A2 Quant 1.5 (L) 1.8 - 3.2 %   Hgb F Quant 0.0 0.0 - 2.0 %   Hgb S Quant 0.0 0.0 %   Hgb C 0.0 0.0 %   Hgb A 98.5 96.4 - 98.8 %   Hgb Variant 0.0 0.0 %   Please Note: Comment   Urinalysis, Complete w Microscopic  Result Value Ref Range   Color, Urine YELLOW (A) YELLOW   APPearance HAZY (A) CLEAR   Specific Gravity, Urine 1.018 1.005 - 1.030   pH 6.0 5.0 - 8.0   Glucose, UA NEGATIVE NEGATIVE mg/dL   Hgb urine dipstick NEGATIVE NEGATIVE   Bilirubin Urine NEGATIVE NEGATIVE   Ketones, ur NEGATIVE NEGATIVE mg/dL   Protein, ur NEGATIVE NEGATIVE mg/dL   Nitrite NEGATIVE NEGATIVE   Leukocytes, UA LARGE (A) NEGATIVE   RBC / HPF 0-5 0 - 5 RBC/hpf   WBC, UA 6-30 0 - 5 WBC/hpf   Bacteria, UA RARE (A) NONE SEEN   Squamous Epithelial / LPF 6-30 (A) NONE SEEN   Mucus PRESENT       Assessment & Plan:   Problem List Items Addressed This Visit      Other   Morbid obesity ([place])    Encouragement given; she'll continue Saxenda, limiting total calories, saturated fats, getting plenty of water, trying to increase activity, etc.      Relevant Medications   Liraglutide -Weight Management (SAXENDA) 18 MG/3ML SOPN   Anemia, iron deficiency    Working with hematologist          Follow up plan: Return in about 3 months (around 06/17/2017).  An after-visit summary was printed and given to the patient at [facility].  Please see the patient instructions which may contain other information and recommendations beyond  what is mentioned  above in the assessment and plan.  Meds ordered this encounter  Medications  . Liraglutide -Weight Management (SAXENDA) 18 MG/3ML SOPN    Sig: Inject 3 mg into the skin daily.    Dispense:  15 mL    Refill:  5    No orders of the defined types were placed in this encounter.

## 2017-03-19 NOTE — Patient Instructions (Addendum)
Try sublingual (SL) vitamin B12 to help with energy and weight loss Keep up the amazing job with your dietary changes [email or web address] Try the 7 minute scientific work-out

## 2017-03-22 NOTE — Assessment & Plan Note (Signed)
Working with hematologist

## 2017-03-22 NOTE — Assessment & Plan Note (Signed)
Encouragement given; she'll continue Saxenda, limiting total calories, saturated fats, getting plenty of water, trying to increase activity, etc.

## 2017-03-23 ENCOUNTER — Other Ambulatory Visit: Payer: Self-pay | Admitting: Oncology

## 2017-03-26 ENCOUNTER — Other Ambulatory Visit: Payer: Self-pay | Admitting: Family Medicine

## 2017-03-26 NOTE — Telephone Encounter (Signed)
Finished 8 weeks of Rx, now once a month

## 2017-03-27 ENCOUNTER — Other Ambulatory Visit: Payer: Self-pay | Admitting: Urgent Care

## 2017-03-27 MED ORDER — OMEPRAZOLE 20 MG PO CPDR: 20.0000 mg | DELAYED_RELEASE_CAPSULE | Freq: Every day | ORAL | 0 refills | Status: DC

## 2017-04-01 ENCOUNTER — Other Ambulatory Visit: Payer: BC Managed Care – PPO

## 2017-04-03 ENCOUNTER — Inpatient Hospital Stay: Payer: BC Managed Care – PPO

## 2017-04-06 ENCOUNTER — Inpatient Hospital Stay: Payer: BC Managed Care – PPO | Attending: Hematology and Oncology

## 2017-04-06 ENCOUNTER — Encounter: Payer: BC Managed Care – PPO | Admitting: Obstetrics and Gynecology

## 2017-04-06 ENCOUNTER — Other Ambulatory Visit: Payer: Self-pay

## 2017-04-06 DIAGNOSIS — D649 Anemia, unspecified: Secondary | ICD-10-CM | POA: Diagnosis not present

## 2017-04-06 DIAGNOSIS — D509 Iron deficiency anemia, unspecified: Secondary | ICD-10-CM | POA: Insufficient documentation

## 2017-04-06 DIAGNOSIS — M549 Dorsalgia, unspecified: Secondary | ICD-10-CM | POA: Diagnosis not present

## 2017-04-06 DIAGNOSIS — R5382 Chronic fatigue, unspecified: Secondary | ICD-10-CM | POA: Diagnosis not present

## 2017-04-06 DIAGNOSIS — Z800 Family history of malignant neoplasm of digestive organs: Secondary | ICD-10-CM | POA: Diagnosis not present

## 2017-04-06 DIAGNOSIS — E119 Type 2 diabetes mellitus without complications: Secondary | ICD-10-CM | POA: Diagnosis not present

## 2017-04-06 DIAGNOSIS — Z794 Long term (current) use of insulin: Secondary | ICD-10-CM | POA: Diagnosis not present

## 2017-04-06 DIAGNOSIS — Z79899 Other long term (current) drug therapy: Secondary | ICD-10-CM | POA: Insufficient documentation

## 2017-04-06 DIAGNOSIS — R51 Headache: Secondary | ICD-10-CM | POA: Insufficient documentation

## 2017-04-06 DIAGNOSIS — R0609 Other forms of dyspnea: Secondary | ICD-10-CM | POA: Diagnosis not present

## 2017-04-06 DIAGNOSIS — K5900 Constipation, unspecified: Secondary | ICD-10-CM | POA: Insufficient documentation

## 2017-04-06 DIAGNOSIS — D508 Other iron deficiency anemias: Secondary | ICD-10-CM

## 2017-04-06 LAB — CBC WITH DIFFERENTIAL/PLATELET
Basophils Absolute: 0 10*3/uL (ref 0–0.1)
Basophils Relative: 1 %
Eosinophils Absolute: 0.1 10*3/uL (ref 0–0.7)
Eosinophils Relative: 3 %
HCT: 35.2 % (ref 35.0–47.0)
Hemoglobin: 11 g/dL — ABNORMAL LOW (ref 12.0–16.0)
Lymphocytes Relative: 42 %
Lymphs Abs: 2.2 10*3/uL (ref 1.0–3.6)
MCH: 21.6 pg — ABNORMAL LOW (ref 26.0–34.0)
MCHC: 31.2 g/dL — ABNORMAL LOW (ref 32.0–36.0)
MCV: 69.2 fL — ABNORMAL LOW (ref 80.0–100.0)
Monocytes Absolute: 0.4 10*3/uL (ref 0.2–0.9)
Monocytes Relative: 7 %
Neutro Abs: 2.5 10*3/uL (ref 1.4–6.5)
Neutrophils Relative %: 47 %
Platelets: 358 10*3/uL (ref 150–440)
RBC: 5.09 MIL/uL (ref 3.80–5.20)
RDW: 25 % — ABNORMAL HIGH (ref 11.5–14.5)
WBC: 5.3 10*3/uL (ref 3.6–11.0)

## 2017-04-06 LAB — FERRITIN: Ferritin: 94 ng/mL (ref 11–307)

## 2017-04-10 ENCOUNTER — Inpatient Hospital Stay: Payer: BC Managed Care – PPO | Admitting: Hematology and Oncology

## 2017-04-10 ENCOUNTER — Inpatient Hospital Stay: Payer: BC Managed Care – PPO

## 2017-04-24 ENCOUNTER — Inpatient Hospital Stay: Payer: BC Managed Care – PPO | Attending: Hematology and Oncology | Admitting: Hematology and Oncology

## 2017-04-24 ENCOUNTER — Other Ambulatory Visit: Payer: Self-pay | Admitting: Hematology and Oncology

## 2017-04-24 ENCOUNTER — Inpatient Hospital Stay: Payer: BC Managed Care – PPO

## 2017-04-24 ENCOUNTER — Other Ambulatory Visit: Payer: Self-pay | Admitting: *Deleted

## 2017-04-24 VITALS — BP 0/0 | HR 71 | Temp 98.2°F | Resp 20 | Wt 271.0 lb

## 2017-04-24 DIAGNOSIS — Z79899 Other long term (current) drug therapy: Secondary | ICD-10-CM | POA: Diagnosis not present

## 2017-04-24 DIAGNOSIS — K5900 Constipation, unspecified: Secondary | ICD-10-CM | POA: Diagnosis not present

## 2017-04-24 DIAGNOSIS — R5383 Other fatigue: Secondary | ICD-10-CM | POA: Insufficient documentation

## 2017-04-24 DIAGNOSIS — D649 Anemia, unspecified: Secondary | ICD-10-CM

## 2017-04-24 DIAGNOSIS — D509 Iron deficiency anemia, unspecified: Secondary | ICD-10-CM | POA: Diagnosis not present

## 2017-04-24 LAB — OCCULT BLOOD X 1 CARD TO LAB, STOOL
FECAL OCCULT BLD: NEGATIVE
FECAL OCCULT BLD: NEGATIVE
Fecal Occult Bld: NEGATIVE

## 2017-04-24 NOTE — Progress Notes (Signed)
[facility] day:  04/24/2017  Chief Complaint: Sherri Robertson is a 32 y.o. female with iron deficiency anemia who is seen for 2 month assessment.  HPI:   The patient was last seen in the hematology clinic on 02/20/2017.  At that time, she was chronically fatigued.  Exam was unremarkable.  She was felt to have isolated iron deficiency, but possible thalassesmia.  We discussed Venofer x 3 to assess response to repleting iron stores (MCV and ferritin).  She received Venofer weekly x 4 (02/20/2017 - 03/10/2017).  She noted abdominal pain after her first infusion.  CBC on 04/06/2017 revealed a hematocrit of 35.2, hemoglobin 11.0, MCV 69.2.  Ferritin was 94.  Guaiac cards x 3 (04/09/2017, 04/11/2017,  04/24/2017) were all negative.  Symptomatically, she is feeling "blah". She has continued fatigue. She notes that she felt better initially following her intravenous iron infusions. She continues to have constipation. She is on Saxenda, which is known to cause constipation. Patient denies bleeding; no hematochezia, melena, or vaginal bleeding. Menses are regular and "normal".  Patient denies ice pica. She has no restless leg symptoms. Patient states, "I am craving meat. I normally crave carbs. I ate a hamburger this week.  Yesterday I ate a steak".  She is eating well. She has maintained her weight. Patient denies reflux symptoms. She was diagnosed with a peptic ulcer at the age of 40. She has never follow up direct visualization via endoscopy. Patient has never had a colonoscopy. Patient is currently taking omeprazole.    Past Medical History:  Diagnosis Date  . Anemia     Past Surgical History:  Procedure Laterality Date  . CESAREAN SECTION    . CHOLECYSTECTOMY    . TONSILECTOMY/ADENOIDECTOMY WITH MYRINGOTOMY      Family History  Problem Relation Age of Onset  . Hypertension Mother   . Depression Mother   . Hyperlipidemia Mother   . Depression  Sister   . Anxiety disorder Sister   . Anxiety disorder Brother   . Eczema Daughter   . COPD Maternal Grandmother   . Emphysema Maternal Grandmother   . Cancer Maternal Grandfather        stomach cancer    Social History:  reports that  has never smoked. she has never used smokeless tobacco. She reports that she drinks alcohol. She reports that she does not use drugs.  Patient has never smoked. She drinks on a very infrequent basis. 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 Patient denies known exposures to radiation or toxins. She lives in [place].  The patient is alone today.   Allergies: No Known Allergies  Current Medications: Current Outpatient Medications  Medication Sig Dispense Refill  . acetaminophen (TYLENOL) 325 MG tablet Take 325 mg by mouth every 6 (six) hours as needed.    . bismuth subsalicylate (PEPTO BISMOL) 262 MG/15ML suspension Take 30 mLs by mouth every 6 (six) hours as needed.    . cyclobenzaprine (FLEXERIL) 5 MG tablet Take 1 tablet (5 mg total) by mouth every 8 (eight) hours as needed for muscle spasms. 30 tablet 0  . Insulin Pen Needle (PEN NEEDLES) 31G X 6 MM MISC For use with daily pen injectable; subcutaneously; once a day 100 each 1  . Liraglutide -Weight Management (SAXENDA) 18 MG/3ML SOPN Inject 3 mg into the skin daily. 15 mL 5  . omeprazole (PRILOSEC) 20 MG capsule Take 1 capsule (20 mg total) by  mouth daily. 30 capsule 0  . simethicone (GAS-X) 80 MG chewable tablet Chew 1 tablet (80 mg total) every 6 (six) hours as needed by mouth for flatulence. 30 tablet 0  . Vitamin D, Ergocalciferol, (DRISDOL) 50000 units CAPS capsule Take 1 capsule (50,000 Units total) by mouth every 30 (thirty) days. 1 capsule 2   No current facility-administered medications for this visit.     Review of Systems:  GENERAL:  Feels "alright".  Initially better after IV iron.  No fevers or sweats.  Weight stable. PERFORMANCE STATUS (ECOG):  1 HEENT:  No visual  changes, runny nose, sore throat, mouth sores or tenderness. Lungs:  Shortness of breath with walking.  No cough.  No hemoptysis. Cardiac:  No chest pain, palpitations, orthopnea, or PND. GI:  Constipation.  No nausea, vomiting, diarrhea, melena or hematochezia. GU:  No urgency, frequency, dysuria, or hematuria.  Menses 3 days each month and "normal" (not heavy). Musculoskeletal:  Back pain felt secondary to weight.  No joint pain.  No muscle tenderness. Extremities:  No pain or swelling. Skin:  No rashes or skin changes. Neuro:  No headache, numbness or weakness, balance or coordination issues. Endocrine:  Diabetes.  No thyroid issues, hot flashes or night sweats. Psych:  No mood changes, depression or anxiety. Pain:  No focal pain. Review of systems:  All other systems reviewed and found to be negative.  Physical Exam: Blood pressure (!) 0/0, pulse 71, temperature 98.2 F (36.8 C), resp. rate 20, weight 271 lb (122.9 kg). GENERAL:  Well developed, well nourished, heavyset woman sitting comfortably in the exam room in no acute distress. MENTAL STATUS:  Alert and oriented to person, place and time. HEAD:  Brown hair in a bun.  Normocephalic, atraumatic, face symmetric, no Cushingoid features. EYES:  Brown eyes.  Pupils equal round and reactive to light and accomodation.  No conjunctivitis or scleral icterus. ENT:  Oropharynx clear without lesion.  Tongue normal. Mucous membranes moist.  RESPIRATORY:  Clear to auscultation without rales, wheezes or rhonchi. CARDIOVASCULAR:  Regular rate and rhythm without murmur, rub or gallop. ABDOMEN:  Soft, non-tender, with active bowel sounds, and no appreciable hepatosplenomegaly.  No masses. SKIN:  Right shoulder fairy tattoo.  No rashes, ulcers or lesions. EXTREMITIES: No edema, no skin discoloration or tenderness.  No palpable cords. LYMPH NODES: No palpable cervical, supraclavicular, axillary or inguinal adenopathy  NEUROLOGICAL:  Unremarkable. PSYCH:  Appropriate.   Orders Only on 04/24/2017  Component Date Value Ref Range Status  . Fecal Occult Bld 04/09/2017 NEGATIVE  NEGATIVE Final   Performed at [facility], [address] [place], [place] [address]  . Fecal Occult Bld 04/11/2017 NEGATIVE  NEGATIVE Final   Performed at [facility], [address] [place], [place] [address]  . Fecal Occult Bld 04/24/2017 NEGATIVE  NEGATIVE Final   Performed at [facility], [address] [place], [place] [address]    Assessment:  Sherri Robertson is a 32 y.o. female with a history of chronic microcytic anemia due to iron deficiency.  Labs dating back to 08/04/2012 revealed a hematocrit that has ranged between 29.7 - 33.5 and hemoglobin 8.2 -  10.5.  She has persistent microcytic indices (62.1 - 67).  Iron saturation have been low (6%).  B12 and folate have been normal.  Diet appears good.  She has a history of powder pica.  She has been intolerant of oral iron in the past.  Labs on 02/02/2017 included a hematocrit  32.9, hemoglobin 9.7, and MCV 65.3.  Normal labs included:  TSH, creatinine, and LFTs.  Ferritin was 25 with an iron saturation of 7% and a TIBC of 305. Vitamin D, 25 hydroxy was low.  Labs on 02/13/2017 revealed low reticulocyte count(1.5%).  B12 and folate were normal. Sedimentation rate was 31.  Urinalysis revealed no hematuria.  Hemoglobin electrophoresis revealed a normal adult hemoglobin.  Hemoglobin A2 was 1.5% and hemoglobin A 98.5%.  Low hemoglobin A2 may indicate an alpha thalassemia or iron deficiency. Guaiac cards are pending.   Ferritin has been followed: 34 on 08/01/2015, 25 on 02/02/2017, and 94 on 04/06/2017.  She received Venofer weekly x 4 (02/20/2017 - 03/10/2017).    She denies any prior EGD or colonoscopy.  She was diagnosed with an asymptomatic ulcer at age 87.  She required PRBC transfusion at age 72 and 48.  Symptomatically, she is chronically fatigued.  Exam  is unremarkable.   Plan: 1.  Review labs from 04/06/2017.   Hemoglobin has improved from 9.7 to 11.  MCV has improved from 65.3 to 69.2. 2.  Review iron stores.  Ferritin 94 on 04/06/2017. No Venofer today. 3.  Discuss previous PUD. Patient notes that it has not given her any issues until now. If iron stores decline again with next lab draw, patient will need to see GI for EGD and colonoscopy.  4.  RTC in 6 weeks for labs (CBC with lab, ferritin). 5.  RTC in 3 months for MD assessment, labs (CBC with diff, ferritin-day before) and +/- Venofer.   Honor Loh, NP  04/24/2017, 2:31 PM   I saw and evaluated the patient, participating in the key portions of the service and reviewing pertinent diagnostic studies and records.  I reviewed the nurse practitioner's note and agree with the findings and the plan.  The assessment and plan were discussed with the patient.  A few questions were asked by the patient and answered.   Nolon Stalls, MD 04/24/2017, 2:31 PM

## 2017-04-24 NOTE — Progress Notes (Signed)
Patient states she is more tired.  She feels like the infusions are not helping.

## 2017-04-26 ENCOUNTER — Encounter: Payer: Self-pay | Admitting: Hematology and Oncology

## 2017-04-26 ENCOUNTER — Other Ambulatory Visit: Payer: Self-pay | Admitting: Urgent Care

## 2017-04-29 ENCOUNTER — Ambulatory Visit: Payer: BC Managed Care – PPO | Admitting: Family Medicine

## 2017-05-05 ENCOUNTER — Encounter: Payer: BC Managed Care – PPO | Admitting: Obstetrics and Gynecology

## 2017-05-26 ENCOUNTER — Encounter: Payer: Self-pay | Admitting: Obstetrics and Gynecology

## 2017-05-26 ENCOUNTER — Ambulatory Visit: Payer: BC Managed Care – PPO | Admitting: Obstetrics and Gynecology

## 2017-05-26 VITALS — BP 111/71 | HR 78 | Ht 62.0 in | Wt 275.0 lb

## 2017-05-26 DIAGNOSIS — Z8742 Personal history of other diseases of the female genital tract: Secondary | ICD-10-CM

## 2017-05-26 DIAGNOSIS — R102 Pelvic and perineal pain: Secondary | ICD-10-CM | POA: Diagnosis not present

## 2017-05-26 DIAGNOSIS — Z87898 Personal history of other specified conditions: Secondary | ICD-10-CM | POA: Diagnosis not present

## 2017-05-26 DIAGNOSIS — N926 Irregular menstruation, unspecified: Secondary | ICD-10-CM

## 2017-05-26 NOTE — Progress Notes (Signed)
Pt states she has pain 62on right side thinking its the cyst

## 2017-05-26 NOTE — Progress Notes (Signed)
GYNECOLOGY PROGRESS NOTE  Subjective:    [name], female    DOB: April 13, 1986, 32 y.o.   MRN: [id number]  HPI  Patient is a 32 y.o. G37P1001 female who presents for a pap smear and evaluation of pelvic intermittent pain.  She is a referral from [facility].  She was previously seen at [facility] in 2015. She reports a h/o abnormal pap smear in 2016, for which she has not had any follow up. She also reports a diagnosis of an ovarian cyst on ultrasound, and wonders if the cyst ever resolved or if she has a new cyst in light of her now having intermittent right-sided pelvic pain that feels sharp/shooting. She is unsure if it is randomly occurring or if it is cyclical in nature as she has not paid attention to the timing of her pain. Pain has been ongoing for the past several months off and on. Notes that she has not experienced this pain since 2016 when she was diagnosed with an ovarian cyst.   She also reports that her most recent menstrual cycle was different this past month. Her periods are normally somewhat heavy with passage of small clots, however this time she reports the passage of larger clots, and that her cycle was heavier than normal.  Menstrual cycles typically last 4-6 days.  She is currently not on contraception.    Past Medical History:  Diagnosis Date  . Anemia    OB History  Gravida Para Term Preterm AB Living  1 1 1         SAB TAB Ectopic Multiple Live Births               # Outcome Date GA Lbr Len/2nd Weight Sex Delivery Anes PTL Lv  1 Term 2012    F CS-Unspec         Past Surgical History:  Procedure Laterality Date  . CESAREAN SECTION    . CHOLECYSTECTOMY    . TONSILECTOMY/ADENOIDECTOMY WITH MYRINGOTOMY     Family History  Problem Relation Age of Onset  . Hypertension Mother   . Depression Mother   . Hyperlipidemia Mother   . Depression Sister   . Anxiety disorder Sister   . Anxiety disorder Brother   . Eczema Daughter     . COPD Maternal Grandmother   . Emphysema Maternal Grandmother   . Cancer Maternal Grandfather        stomach cancer    Social History   Socioeconomic History  . Marital status: Single    Spouse name: Not on file  . Number of children: Not on file  . Years of education: Not on file  . Highest education level: Not on file  Social Needs  . Financial resource strain: Not on file  . Food insecurity - worry: Not on file  . Food insecurity - inability: Not on file  . Transportation needs - medical: Not on file  . Transportation needs - non-medical: Not on file  Occupational History  . Not on file  Tobacco Use  . Smoking status: Never Smoker  . Smokeless tobacco: Never Used  Substance and Sexual Activity  . Alcohol use: Yes    Comment: occassional  . Drug use: No  . Sexual activity: Yes    Birth control/protection: None  Other Topics Concern  . Not on file  Social History Narrative  . Not on file    Current Outpatient Medications on File Prior to Visit  Medication Sig Dispense Refill  . acetaminophen (TYLENOL) 325 MG tablet Take 325 mg by mouth every 6 (six) hours as needed.    . bismuth subsalicylate (PEPTO BISMOL) 262 MG/15ML suspension Take 30 mLs by mouth every 6 (six) hours as needed.    . cyclobenzaprine (FLEXERIL) 5 MG tablet Take 1 tablet (5 mg total) by mouth every 8 (eight) hours as needed for muscle spasms. 30 tablet 0  . Insulin Pen Needle (PEN NEEDLES) 31G X 6 MM MISC For use with daily pen injectable; subcutaneously; once a day 100 each 1  . Liraglutide -Weight Management (SAXENDA) 18 MG/3ML SOPN Inject 3 mg into the skin daily. 15 mL 5  . omeprazole (PRILOSEC) 20 MG capsule TAKE 1 CAPSULE BY MOUTH EVERY DAY 30 capsule 3  . simethicone (GAS-X) 80 MG chewable tablet Chew 1 tablet (80 mg total) every 6 (six) hours as needed by mouth for flatulence. 30 tablet 0  . Vitamin D, Ergocalciferol, (DRISDOL) 50000 units CAPS capsule Take 1 capsule (50,000 Units total) by  mouth every 30 (thirty) days. 1 capsule 2   No current facility-administered medications on file prior to visit.     No Known Allergies   Review of Systems Pertinent items noted in HPI and remainder of comprehensive ROS otherwise negative.   Objective:   Blood pressure 111/71, pulse 78, height 5\' 2"  (1.575 m), weight 275 lb (124.7 kg), last menstrual period 05/07/2017.  Body mass index is 50.3 kg/m.  General appearance: alert and no distress, morbid obesity Abdomen: soft, non-tender; bowel sounds normal; no masses,  no organomegaly Pelvic: external genitalia normal, rectovaginal septum normal.  Vagina without discharge.  Cervix normal appearing, no lesions and no motion tenderness.  Uterus mobile, nontender, normal shape and size.  Adnexae non-palpable, nontender bilaterally.  Extremities: extremities normal, atraumatic, no cyanosis or edema Neurologic: Grossly normal   Assessment:   H/o abnormal pap smear Heavy menses H/o ovarian cyst Pelvic pain Morbid obesity  Plan:   1. H/o abnormal pap smear - review in old medical records system [employer] notes patient had ASCUS HR HPV+ pap smear in 08/2014.  Repeated pap smear today.  If abnormal, patient will need colposcopy.  2. Heavier menses x 1 episode - unsure if this was a one time occurrence last month, or if this will continue in the future.  Patient has also been getting iron injections over the past few months for iron deficiency anemia.  Is currently undergoing workup for anemia (may possibly be secondary to thalassemia).  She is also on weight loss medication Saxenda for her morbid obesity. She wonders if any of these medications could cause her symptoms as she has initiated both within the past 2 months. Discussed that medications are not a likely cause of abnormal periods, but that other factors such as her obesity, hormonal fluctuations, etc. Will order pelvic ultrasound to r/o ovarian cyst and can also assess for any  structural causes for abnormal menses. Briefly discussed further workup and possible treatment options if abnormal bleeding continues.  3. H/o ovarian cyst - patient with intermittent pelvic pain on right side, and h/o ovarian cyst in the past. Will order pelvic ultrasound.  4. Pelvic pain - intermittent, only lasting for short period of time, usually seconds to a few minutes.   5. Morbid obesity - can cause irregular bleeding, and also she has an increased risk for endometrial hyperplasia.  If heavy or abnormal bleeding continues, she will likley need to have an endometrial biopsy  as part of her workup.   Will notify patient of results of ultrasound by phone.    Rubie Maid, MD [facility]

## 2017-05-26 NOTE — Patient Instructions (Signed)
Pelvic Pain, Female Pelvic pain is pain in your lower abdomen, below your belly button and between your hips. The pain may start suddenly (acute), keep coming back (recurring), or last a long time (chronic). Pelvic pain that lasts longer than six months is considered chronic. Pelvic pain may affect your:  Reproductive organs.  Urinary system.  Digestive tract.  Musculoskeletal system.  There are many potential causes of pelvic pain. Sometimes, the pain can be a result of digestive or urinary conditions, strained muscles or ligaments, or even reproductive conditions. Sometimes the cause of pelvic pain is not known. Follow these instructions at home:  Take over-the-counter and prescription medicines only as told by your health care provider.  Rest as told by your health care provider.  Do not have sex it if hurts.  Keep a journal of your pelvic pain. Write down: ? When the pain started. ? Where the pain is located. ? What seems to make the pain better or worse, such as food or your menstrual cycle. ? Any symptoms you have along with the pain.  Keep all follow-up visits as told by your health care provider. This is important. Contact a health care provider if:  Medicine does not help your pain.  Your pain comes back.  You have new symptoms.  You have abnormal vaginal discharge or bleeding, including bleeding after menopause.  You have a fever or chills.  You are constipated.  You have blood in your urine or stool.  You have foul-smelling urine.  You feel weak or lightheaded. Get help right away if:  You have sudden severe pain.  Your pain gets steadily worse.  You have severe pain along with fever, nausea, vomiting, or excessive sweating.  You lose consciousness. This information is not intended to replace advice given to you by your health care provider. Make sure you discuss any questions you have with your health care provider. Document Released: 02/26/2004  Document Revised: 04/25/2015 Document Reviewed: 01/19/2015 Elsevier Interactive Patient Education  2018 [employer]  

## 2017-05-27 NOTE — Addendum Note (Signed)
Addended by: Augusto Gamble on: 05/27/2017 03:43 PM   Modules accepted: Orders

## 2017-05-29 ENCOUNTER — Other Ambulatory Visit: Payer: BC Managed Care – PPO

## 2017-05-31 LAB — IGP, COBASHPV16/18
HPV 16: NEGATIVE
HPV 18: NEGATIVE
HPV OTHER HR TYPES: POSITIVE — AB
PAP SMEAR COMMENT: 0

## 2017-06-01 ENCOUNTER — Telehealth: Payer: Self-pay

## 2017-06-01 NOTE — Telephone Encounter (Signed)
Left message via voicemail to call the office

## 2017-06-03 ENCOUNTER — Telehealth: Payer: Self-pay | Admitting: Family Medicine

## 2017-06-03 NOTE — Telephone Encounter (Signed)
Copied from [place] [id number]. Topic: Quick Communication - See Telephone Encounter >> Jun 03, 2017  1:23 PM Synthia Innocent wrote: CRM for notification. See Telephone encounter for:  Covermymeds calling on PA of  Liraglutide -Weight Management (SAXENDA) 18 MG/3ML SOPN, Approval is on file through [place], Ref# [id number] 06/03/17.

## 2017-06-03 NOTE — Telephone Encounter (Signed)
Message from [place] has indicated that it is too soon to refill this medication at the pharmacy for your patient. If you need to renew an existing PA for your patient's medication, please reach out to [facility] directly at [phone]

## 2017-06-05 ENCOUNTER — Inpatient Hospital Stay: Payer: BC Managed Care – PPO | Attending: Hematology and Oncology

## 2017-06-18 ENCOUNTER — Ambulatory Visit: Payer: BC Managed Care – PPO | Admitting: Family Medicine

## 2017-06-25 ENCOUNTER — Other Ambulatory Visit: Payer: Self-pay | Admitting: Family Medicine

## 2017-07-12 ENCOUNTER — Other Ambulatory Visit: Payer: Self-pay | Admitting: Urgent Care

## 2017-07-22 ENCOUNTER — Other Ambulatory Visit: Payer: BC Managed Care – PPO

## 2017-07-25 ENCOUNTER — Other Ambulatory Visit: Payer: Self-pay | Admitting: Family Medicine

## 2017-07-25 ENCOUNTER — Ambulatory Visit
Admission: EM | Admit: 2017-07-25 | Discharge: 2017-07-25 | Disposition: A | Discharge status: Home / Self Care | Payer: BC Managed Care – PPO | Attending: Family Medicine | Admitting: Family Medicine

## 2017-07-25 ENCOUNTER — Other Ambulatory Visit: Payer: Self-pay

## 2017-07-25 ENCOUNTER — Encounter: Payer: Self-pay | Admitting: Gynecology

## 2017-07-25 DIAGNOSIS — N898 Other specified noninflammatory disorders of vagina: Secondary | ICD-10-CM

## 2017-07-25 LAB — HCG, QUANTITATIVE, PREGNANCY: hCG, Beta Chain, Quant, S: <1 m[IU]/mL (ref <5)

## 2017-07-25 LAB — WET PREP, GENITAL
CLUE CELLS WET PREP: NONE SEEN
Sperm: NONE SEEN
TRICH WET PREP: NONE SEEN
Yeast Wet Prep HPF POC: NONE SEEN

## 2017-07-25 NOTE — ED Provider Notes (Signed)
[facility] ____________________________________________  Time seen: Approximately 11:07 AM  I have reviewed the triage vital signs and the nursing notes.   HISTORY  Chief Complaint Vaginitis  HPI Sherri Robertson is a 32 y.o. female presenting for evaluation of vaginal discharge.  Patient states she noticed a slight vaginal itching, but otherwise no vaginal discomfort.  States vaginal discharge is more clear, and states it is just more discharge than she is used to.  States last sexual intercourse was about 3 days ago.  States that she did douche afterwards, stating that she used water and vinegar.  Patient states that this is normal practice for her.  Denies any concerns of STDs.  Denies pregnancy.  States last menstrual was approximately 3 weeks ago and described as normal.  Denies any accompanying dysuria, abdominal pain, back pain, fevers, sore throat, rash, lesions, vaginal pain or pelvic pain.  States otherwise feels well.  States she does have an OB/GYN appointment this coming Tuesday for follow-up of her chronic right ovarian cyst, but states that she wanted to make sure nothing was was happening prior to that. Denies recent sickness. Denies recent antibiotic use.   Patient's last menstrual period was 07/01/2017.  Arnetha Courser, MD: PCP   Past Medical History:  Diagnosis Date  . Anemia     Patient Active Problem List   Diagnosis Date Noted  . Prediabetes 02/05/2017  . 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 Preventative health care 01/22/2017  . Thyroid nodule 01/22/2017  . Vitamin D deficiency 01/22/2017  . Acanthosis nigricans 08/05/2015  . Thyromegaly 08/05/2015  . Elevated hemoglobin A1c 08/02/2015  . Abnormal cervical Pap smear with positive HPV DNA test 01/26/2015  . Anxiety and depression 01/26/2015  . Dysmenorrhea 01/26/2015  . LBP (low back pain) 01/26/2015  . Fibroid 01/26/2015  . Elevated glucose 01/26/2015  . Other fatigue 01/26/2015  . Anemia, iron  deficiency 09/21/2009  . Morbid obesity ([place]) 07/27/2009    Past Surgical History:  Procedure Laterality Date  . CESAREAN SECTION    . CHOLECYSTECTOMY    . TONSILECTOMY/ADENOIDECTOMY WITH MYRINGOTOMY       No current facility-administered medications for this encounter.   Current Outpatient Medications:  .  acetaminophen (TYLENOL) 325 MG tablet, Take 325 mg by mouth every 6 (six) hours as needed., Disp: , Rfl:  .  bismuth subsalicylate (PEPTO BISMOL) 262 MG/15ML suspension, Take 30 mLs by mouth every 6 (six) hours as needed., Disp: , Rfl:  .  cyclobenzaprine (FLEXERIL) 5 MG tablet, Take 1 tablet (5 mg total) by mouth every 8 (eight) hours as needed for muscle spasms., Disp: 30 tablet, Rfl: 0 .  Liraglutide -Weight Management (SAXENDA) 18 MG/3ML SOPN, Inject 3 mg into the skin daily., Disp: 15 mL, Rfl: 5 .  omeprazole (PRILOSEC) 20 MG capsule, TAKE 1 CAPSULE BY MOUTH EVERY DAY, Disp: 30 capsule, Rfl: 3 .  simethicone (GAS-X) 80 MG chewable tablet, Chew 1 tablet (80 mg total) every 6 (six) hours as needed by mouth for flatulence., Disp: 30 tablet, Rfl: 0 .  Vitamin D, Ergocalciferol, (DRISDOL) 50000 units CAPS capsule, TAKE 1 CAPSULE (50,000 UNITS TOTAL) BY MOUTH EVERY 30 (THIRTY) DAYS., Disp: 1 capsule, Rfl: 2 .  Insulin Pen Needle (ULTICARE MINI PEN NEEDLES) 31G X 6 MM MISC, FOR USE WITH DAILY PEN INJECTABLE SUBCUTANEOUSLY ONCE A DAY, Disp: 90 each, Rfl: 1  Allergies Patient has no known allergies.  Family History  Problem Relation Age of Onset  . Hypertension Mother   .  Depression Mother   . Hyperlipidemia Mother   . Depression Sister   . Anxiety disorder Sister   . Anxiety disorder Brother   . Eczema Daughter   . COPD Maternal Grandmother   . Emphysema Maternal Grandmother   . Cancer Maternal Grandfather        stomach cancer    Social History Social History   Tobacco Use  . Smoking status: Never Smoker  . Smokeless tobacco: Never Used  Substance Use Topics  .  Alcohol use: Yes    Comment: occassional  . Drug use: No    Review of Systems Constitutional: No fever/chills Cardiovascular: Denies chest pain. Respiratory: Denies shortness of breath. Gastrointestinal: No abdominal pain.  No nausea, no vomiting.  No diarrhea.  No constipation. Genitourinary: Negative for dysuria. As above.  Musculoskeletal: Negative for back pain. Skin: Negative for rash.   ____________________________________________   PHYSICAL EXAM:  VITAL SIGNS: ED Triage Vitals  Enc Vitals Group     BP 07/25/17 0955 108/76     Pulse Rate 07/25/17 0955 74     Resp 07/25/17 0955 18     Temp 07/25/17 0955 98 F (36.7 C)     Temp Source 07/25/17 0955 Oral     SpO2 07/25/17 0955 100 %     Weight 07/25/17 0956 276 lb (125.2 kg)     Height 07/25/17 0956 5\' 2"  (1.575 m)     Head Circumference --      Peak Flow --      Pain Score 07/25/17 0956 0     Pain Loc --      Pain Edu? --      Excl. in [place]? --     Constitutional: Alert and oriented. Well appearing and in no acute distress. Cardiovascular: Normal rate, regular rhythm. Grossly normal heart sounds.  Good peripheral circulation. Respiratory: Normal respiratory effort without tachypnea nor retractions. Breath sounds are clear and equal bilaterally. No wheezes, rales, rhonchi. Gastrointestinal: Soft and nontender.No CVA tenderness. Musculoskeletal: No midline cervical, thoracic or lumbar tenderness to palpation. Bilateral pedal pulses equal and easily palpated. Neurologic:  Normal speech and language. Speech is normal. No gait instability.  Skin:  Skin is warm, dry and intact. No rash noted. Psychiatric: Mood and affect are normal. Speech and behavior are normal. Patient exhibits appropriate insight and judgment   ___________________________________________   LABS (all labs ordered are listed, but only abnormal results are displayed)  Labs Reviewed  WET PREP, GENITAL - Abnormal; Notable for the following  components:      Result Value   WBC, Wet Prep HPF POC MODERATE (*)    All other components within normal limits  HCG, QUANTITATIVE, PREGNANCY   ____________________________________________  PROCEDURES Procedures  INITIAL IMPRESSION / ASSESSMENT AND PLAN / ED COURSE  Pertinent labs & imaging results that were available during my care of the patient were reviewed by me and considered in my medical decision making (see chart for details).  Well-appearing patient.  No acute distress.  Presenting for evaluation of vaginal discharge.  Denies any pain or concerns of STDs.  Patient declines pelvic exam and opted for self wet prep.  Also serum hCG.  Wet prep reviewed, overall unremarkable as well as hCG negative.  Discussed this in detail with patient.  Suspect may be related to vaginitis post douching, recommend to stop douching and supportive care follow-up with gynecology this week as scheduled.  Discussed follow up with Primary care physician this week. Discussed follow up and  return parameters including no resolution or any worsening concerns. Patient verbalized understanding and agreed to plan.   ____________________________________________   FINAL CLINICAL IMPRESSION(S) / ED DIAGNOSES  Final diagnoses:  Vaginal discharge     ED Discharge Orders    None       Note: This dictation was prepared with Dragon dictation along with smaller phrase technology. Any transcriptional errors that result from this process are unintentional.         Marylene Land, NP 07/25/17 1406

## 2017-07-25 NOTE — Discharge Instructions (Signed)
Monitor as discussed. Follow up this week as scheduled.   Follow up with your primary care physician this week as needed. Return to Urgent care for new or worsening concerns.

## 2017-07-25 NOTE — ED Triage Notes (Signed)
Patient c/o  Clear vaginal discharge and itching x yesterday.

## 2017-07-27 ENCOUNTER — Other Ambulatory Visit: Payer: BC Managed Care – PPO

## 2017-07-28 ENCOUNTER — Ambulatory Visit: Payer: BC Managed Care – PPO

## 2017-07-28 ENCOUNTER — Encounter: Payer: Self-pay | Admitting: Family Medicine

## 2017-07-28 ENCOUNTER — Ambulatory Visit: Payer: BC Managed Care – PPO | Admitting: Hematology and Oncology

## 2017-07-28 ENCOUNTER — Ambulatory Visit: Payer: BC Managed Care – PPO | Admitting: Family Medicine

## 2017-07-28 VITALS — BP 126/74 | HR 85 | Temp 98.2°F | Resp 16 | Ht 62.0 in | Wt 280.9 lb

## 2017-07-28 DIAGNOSIS — D509 Iron deficiency anemia, unspecified: Secondary | ICD-10-CM

## 2017-07-28 DIAGNOSIS — R7303 Prediabetes: Secondary | ICD-10-CM

## 2017-07-28 DIAGNOSIS — M545 Low back pain: Secondary | ICD-10-CM | POA: Diagnosis not present

## 2017-07-28 DIAGNOSIS — E559 Vitamin D deficiency, unspecified: Secondary | ICD-10-CM

## 2017-07-28 DIAGNOSIS — Z5181 Encounter for therapeutic drug level monitoring: Secondary | ICD-10-CM

## 2017-07-28 DIAGNOSIS — G8929 Other chronic pain: Secondary | ICD-10-CM

## 2017-07-28 DIAGNOSIS — E010 Iodine-deficiency related diffuse (endemic) goiter: Secondary | ICD-10-CM | POA: Diagnosis not present

## 2017-07-28 MED ORDER — CYCLOBENZAPRINE HCL 5 MG PO TABS: 5.0000 mg | ORAL_TABLET | Freq: Three times a day (TID) | ORAL | 0 refills | Status: DC | PRN

## 2017-07-28 NOTE — Assessment & Plan Note (Addendum)
Check A1c today; she needs to lose significant weight to help prevent diabetes; she does NOT want to use metformin, offered today

## 2017-07-28 NOTE — Assessment & Plan Note (Signed)
Managed by hematologist; may be thalassemia, but checking labs tomorrow

## 2017-07-28 NOTE — Assessment & Plan Note (Signed)
Patient thinks this is weight related; okay for limited flexeril as we work on weight loss

## 2017-07-28 NOTE — Progress Notes (Signed)
BP 126/74   Pulse 85   Temp 98.2 F (36.8 C) (Oral)   Resp 16   Ht 5\' 2"  (1.575 m)   Wt 280 lb 14.4 oz (127.4 kg)   LMP 07/01/2017   SpO2 95%   BMI 51.38 kg/m    Subjective:    [name], female    DOB: [birth year], 32 y.o.   MRN: [id number]  HPI: NIMSI MALES is a 32 y.o. female  Chief Complaint  Patient presents with  . Follow-up    weight, Pt states that the saxenda is not helping as much becasue she does not over eat. Would like to talk about an appitie supressant due to her not over eating but eating the wrong things.   . Medication Refill    Flexeril    HPI Patient is here for follow-up She has morbid obesity; last thyroid was normal; she has taken phentermine in the past which did not work for her; they wouldn't pay for saxenda; she is not actively doing anything to lose weight right now; she thinks about eating all day; she knows it's psychological; no hx of abuse; 3000-6500 steps a day  She has prediabetes; last A1c was 6.1; she had a single A1c of 6.5 almost two years ago, but no other numbers to corroborate diabetes, so she is treated as prediabetes  She is also asking for a refill of flexeril; she was having back pain  She has GERD and is on a PPI; can cause problem with iron absorption, so getting IV iron; has not tried coming off of the medicine, cannot come off, stomach burns without it, no matter what she eats; avoids spicy foods  She sees Dr. Mike Gip, hematologist, about anemia; stool cards in Dec and Jan were negative x 3; goes tomorrow to get her labs checked; wanted to get more lab to see if thalassemia  She has low vitamin D; last vitamin D level was only 9 (October 2018)  Since last visit, vaginal watery d/c but nothing on testing and cleared up; no condoms, has talked to Dr. Marcelline Mates and she said she's playing russian roulette; urgent care suggested getting checked for PCOS, no contraception and no children for 6 years; no  metformin; patient declines metformin  Depression screen [facility] 2/9 07/28/2017 01/22/2017 08/01/2015 01/26/2015  Decreased Interest 0 0 0 0  Down, Depressed, Hopeless 0 0 0 0  PHQ - 2 Score 0 0 0 0    Relevant past medical, surgical, family and social history reviewed Past Medical History:  Diagnosis Date  . Anemia    Past Surgical History:  Procedure Laterality Date  . CESAREAN SECTION    . CHOLECYSTECTOMY    . TONSILECTOMY/ADENOIDECTOMY WITH MYRINGOTOMY     Family History  Problem Relation Age of Onset  . Hypertension Mother   . Depression Mother   . Hyperlipidemia Mother   . Depression Sister   . Anxiety disorder Sister   . Anxiety disorder Brother   . Eczema Daughter   . COPD Maternal Grandmother   . Emphysema Maternal Grandmother   . Cancer Maternal Grandfather        stomach cancer   Social History   Tobacco Use  . Smoking status: Never Smoker  . Smokeless tobacco: Never Used  Substance Use Topics  . Alcohol use: Yes    Comment: occassional  . Drug use: No    Interim medical history since last visit reviewed. Allergies and medications reviewed  Review of Systems Per HPI unless specifically indicated above     Objective:    BP 126/74   Pulse 85   Temp 98.2 F (36.8 C) (Oral)   Resp 16   Ht 5\' 2"  (1.575 m)   Wt 280 lb 14.4 oz (127.4 kg)   LMP 07/01/2017   SpO2 95%   BMI 51.38 kg/m   Wt Readings from Last 3 Encounters:  07/28/17 280 lb 14.4 oz (127.4 kg)  07/25/17 276 lb (125.2 kg)  05/26/17 275 lb (124.7 kg)    Physical Exam  Constitutional: She appears well-developed and well-nourished. No distress.  Morbidly obese  HENT:  Head: Normocephalic and atraumatic.  Eyes: EOM are normal. No scleral icterus.  Neck: No thyromegaly present.  Cardiovascular: Normal rate and regular rhythm.  Pulmonary/Chest: Effort normal and breath sounds normal.  Abdominal: Soft. She exhibits no distension.  Musculoskeletal: Normal range of motion. She exhibits no  edema.  Neurological: She is alert. She exhibits normal muscle tone.  Skin: Skin is warm and dry. She is not diaphoretic. No pallor.  Psychiatric: She has a normal mood and affect. Her behavior is normal. Judgment and thought content normal.    Results for orders placed or performed during the hospital encounter of 07/25/17  Wet prep, genital  Result Value Ref Range   Yeast Wet Prep HPF POC NONE SEEN NONE SEEN   Trich, Wet Prep NONE SEEN NONE SEEN   Clue Cells Wet Prep HPF POC NONE SEEN NONE SEEN   WBC, Wet Prep HPF POC MODERATE (A) NONE SEEN   Sperm NONE SEEN   hCG, quantitative, pregnancy  Result Value Ref Range   hCG, Beta Chain, Quant, S <1 <5 mIU/mL      Assessment & Plan:   Problem List Items Addressed This Visit      Endocrine   Thyromegaly - Primary    Check TSH      Relevant Orders   TSH     Other   Vitamin D deficiency    Check level and supplement if needed      Relevant Orders   VITAMIN D 25 Hydroxy (Vit-D Deficiency, Fractures)   Prediabetes    Check A1c today; she needs to lose significant weight to help prevent diabetes; she does NOT want to use metformin, offered today      Relevant Orders   Hemoglobin A1c   Lipid panel   Morbid obesity (HCC)    See AVS; talked with patient about risk of progressing to outright diabetes with her current BMI; saxenda not covered; she failed phentermine (not with me, as I do not prescribe that); will refer to bariatric surgery; encouraged her to increase her steps most days of the week to 7500; use MyFitnessPal or other app to calorie count; return in one month, goal is 4 pounds down      Relevant Orders   Hemoglobin A1c   Lipid panel   Amb Referral to Bariatric Surgery   Low back pain    Patient thinks this is weight related; okay for limited flexeril as we work on weight loss      Relevant Medications   cyclobenzaprine (FLEXERIL) 5 MG tablet   Anemia, iron deficiency    Managed by hematologist; may be  thalassemia, but checking labs tomorrow       Other Visit Diagnoses    Medication monitoring encounter       Relevant Orders   COMPLETE METABOLIC PANEL WITH GFR  Follow up plan: Return in about 1 month (around 08/27/2017) for follow-up visit with Dr. Sanda Klein.  An after-visit summary was printed and given to the patient at [place].  Please see the patient instructions which may contain other information and recommendations beyond what is mentioned above in the assessment and plan.  Meds ordered this encounter  Medications  . cyclobenzaprine (FLEXERIL) 5 MG tablet    Sig: Take 1 tablet (5 mg total) by mouth every 8 (eight) hours as needed for muscle spasms.    Dispense:  30 tablet    Refill:  0    Orders Placed This Encounter  Procedures  . VITAMIN D 25 Hydroxy (Vit-D Deficiency, Fractures)  . Hemoglobin A1c  . Lipid panel  . COMPLETE METABOLIC PANEL WITH GFR  . TSH  . Amb Referral to Bariatric Surgery

## 2017-07-28 NOTE — Patient Instructions (Addendum)
Check out the information at familydoctor.org entitled "Nutrition for Weight Loss: What You Need to Know about Fad Diets" Try to lose between 1-2 pounds per week by taking in fewer calories and burning off more calories You can succeed by limiting portions, limiting foods dense in calories and fat, becoming more active, and drinking 8 glasses of water a day (64 ounces) Don't skip meals, especially breakfast, as skipping meals may alter your metabolism Do not use over-the-counter weight loss pills or gimmicks that claim rapid weight loss A healthy BMI (or body mass index) is between 18.5 and 24.9 You can calculate your ideal BMI at the NIH website ClubMonetize.fr Try to get 7500 steps every day or at least most days of the week Try MyFitnessPal to log calories We'll have you see the bariatric surgeon We'll get labs today If you have not heard anything from my staff in a week about any orders/referrals/studies from today, please contact us here to follow-up [phone] Try to weigh at least 4 pounds less at your follow-up appointment

## 2017-07-28 NOTE — Assessment & Plan Note (Addendum)
See AVS; talked with patient about risk of progressing to outright diabetes with her current BMI; saxenda not covered; she failed phentermine (not with me, as I do not prescribe that); will refer to bariatric surgery; encouraged her to increase her steps most days of the week to 7500; use MyFitnessPal or other app to calorie count; return in one month, goal is 4 pounds down

## 2017-07-28 NOTE — Assessment & Plan Note (Signed)
Check level and supplement if needed 

## 2017-07-28 NOTE — Assessment & Plan Note (Signed)
Check TSH 

## 2017-07-29 ENCOUNTER — Other Ambulatory Visit: Payer: Self-pay | Admitting: Family Medicine

## 2017-07-29 LAB — HEMOGLOBIN A1C
EAG (MMOL/L): 7.4 (calc)
Hgb A1c MFr Bld: 6.3 % of total Hgb — ABNORMAL HIGH (ref <5.7)
Mean Plasma Glucose: 134 (calc)

## 2017-07-29 LAB — COMPLETE METABOLIC PANEL WITH GFR
AG Ratio: 1.2 (calc) (ref 1.0–2.5)
ALBUMIN MSPROF: 3.9 g/dL (ref 3.6–5.1)
ALKALINE PHOSPHATASE (APISO): 87 U/L (ref 33–115)
ALT: 6 U/L (ref 6–29)
AST: 11 U/L (ref 10–30)
BUN: 10 mg/dL (ref 7–25)
CO2: 29 mmol/L (ref 20–32)
CREATININE: 0.66 mg/dL (ref 0.50–1.10)
Calcium: 8.9 mg/dL (ref 8.6–10.2)
Chloride: 102 mmol/L (ref 98–110)
GFR, Est African American: 135 mL/min/{1.73_m2} (ref >=60)
GFR, Est Non African American: 117 mL/min/{1.73_m2} (ref >=60)
Globulin: 3.3 g/dL (calc) (ref 1.9–3.7)
Glucose, Bld: 99 mg/dL (ref 65–99)
Potassium: 4.1 mmol/L (ref 3.5–5.3)
SODIUM: 137 mmol/L (ref 135–146)
Total Bilirubin: 0.3 mg/dL (ref 0.2–1.2)
Total Protein: 7.2 g/dL (ref 6.1–8.1)

## 2017-07-29 LAB — LIPID PANEL
Cholesterol: 187 mg/dL (ref <200)
HDL: 73 mg/dL (ref >50)
LDL CHOLESTEROL (CALC): 99 mg/dL
Non-HDL Cholesterol (Calc): 114 mg/dL (calc) (ref <130)
TRIGLYCERIDES: 60 mg/dL (ref <150)
Total CHOL/HDL Ratio: 2.6 (calc) (ref <5.0)

## 2017-07-29 LAB — TSH: TSH: 3.85 m[IU]/L

## 2017-07-29 LAB — VITAMIN D 25 HYDROXY (VIT D DEFICIENCY, FRACTURES): Vit D, 25-Hydroxy: 11 ng/mL — ABNORMAL LOW (ref 30–100)

## 2017-07-29 MED ORDER — VITAMIN D (ERGOCALCIFEROL) 1.25 MG (50000 UNIT) PO CAPS: 50000.0000 [IU] | ORAL_CAPSULE | ORAL | 0 refills | Status: DC

## 2017-07-29 NOTE — Progress Notes (Signed)
Vit D rx weekly for 12 weeks

## 2017-07-30 ENCOUNTER — Telehealth: Payer: Self-pay

## 2017-07-30 ENCOUNTER — Inpatient Hospital Stay: Payer: BC Managed Care – PPO | Attending: Hematology and Oncology

## 2017-07-30 DIAGNOSIS — K219 Gastro-esophageal reflux disease without esophagitis: Secondary | ICD-10-CM | POA: Insufficient documentation

## 2017-07-30 DIAGNOSIS — E559 Vitamin D deficiency, unspecified: Secondary | ICD-10-CM | POA: Insufficient documentation

## 2017-07-30 DIAGNOSIS — D509 Iron deficiency anemia, unspecified: Secondary | ICD-10-CM | POA: Diagnosis not present

## 2017-07-30 DIAGNOSIS — Z79899 Other long term (current) drug therapy: Secondary | ICD-10-CM | POA: Insufficient documentation

## 2017-07-30 DIAGNOSIS — K5900 Constipation, unspecified: Secondary | ICD-10-CM | POA: Insufficient documentation

## 2017-07-30 LAB — CBC WITH DIFFERENTIAL/PLATELET
Basophils Absolute: 0 10*3/uL (ref 0–0.1)
Basophils Relative: 0 %
Eosinophils Absolute: 0.1 10*3/uL (ref 0–0.7)
Eosinophils Relative: 1 %
HCT: 34 % — ABNORMAL LOW (ref 35.0–47.0)
Hemoglobin: 11.3 g/dL — ABNORMAL LOW (ref 12.0–16.0)
Lymphocytes Relative: 31 %
Lymphs Abs: 2.9 10*3/uL (ref 1.0–3.6)
MCH: 23.8 pg — ABNORMAL LOW (ref 26.0–34.0)
MCHC: 33.2 g/dL (ref 32.0–36.0)
MCV: 71.7 fL — ABNORMAL LOW (ref 80.0–100.0)
Monocytes Absolute: 0.7 10*3/uL (ref 0.2–0.9)
Monocytes Relative: 8 %
Neutro Abs: 5.6 10*3/uL (ref 1.4–6.5)
Neutrophils Relative %: 60 %
Platelets: 352 10*3/uL (ref 150–440)
RBC: 4.74 MIL/uL (ref 3.80–5.20)
RDW: 16.9 % — ABNORMAL HIGH (ref 11.5–14.5)
WBC: 9.4 10*3/uL (ref 3.6–11.0)

## 2017-07-30 LAB — FERRITIN: Ferritin: 67 ng/mL (ref 11–307)

## 2017-07-30 NOTE — Telephone Encounter (Signed)
-----   Message from Gordy Clement, [place] sent at 07/29/2017  4:16 PM EDT ----- Can you call this pt, I know her   Thanks   ----- Message ----- From: Arnetha Courser, MD Sent: 07/29/2017   8:20 AM To: Gordy Clement, [place], please let pt know that her poor vitamin D level is so very low; let's do vit D Rx once a week for 12 weeks and try to bring this up; a few weeks after she finishes that, let's recheck level (please ORDER vit D, dx: vit D def) A1c is prediabetes range; I can start metformin if she would like which will help with sugars and may help with weight loss; let me know; other labs are fine

## 2017-07-30 NOTE — Telephone Encounter (Signed)
Left detailed voicemail

## 2017-07-31 ENCOUNTER — Inpatient Hospital Stay: Payer: BC Managed Care – PPO

## 2017-07-31 ENCOUNTER — Other Ambulatory Visit: Payer: Self-pay | Admitting: Hematology and Oncology

## 2017-07-31 ENCOUNTER — Inpatient Hospital Stay (HOSPITAL_BASED_OUTPATIENT_CLINIC_OR_DEPARTMENT_OTHER): Payer: BC Managed Care – PPO | Admitting: Urgent Care

## 2017-07-31 VITALS — BP 118/78 | HR 88 | Temp 97.5°F | Resp 18 | Wt 278.6 lb

## 2017-07-31 DIAGNOSIS — D509 Iron deficiency anemia, unspecified: Secondary | ICD-10-CM | POA: Diagnosis not present

## 2017-07-31 MED ORDER — IRON-VITAMIN C 65-125 MG PO TABS: 1.0000 | ORAL_TABLET | Freq: Every day | ORAL | 3 refills | Status: DC

## 2017-07-31 NOTE — Progress Notes (Signed)
Patient states she has lost a couple of pounds.  She has started walking 2 miles a day.  States she is sleeping better and her digestion is much improved.

## 2017-07-31 NOTE — Progress Notes (Signed)
[facility] day:  07/31/2017  Chief Complaint: Sherri Robertson is a 32 y.o. female with iron deficiency anemia who is seen for 3 month assessment.  HPI:   The patient was last seen in the hematology clinic on 04/24/2017.  At that time, patient felt "blah".  She noted continued fatigue despite intravenous iron infusions.  She complained of constipation on Saxenda for weight loss.  No bleeding, ice pica, or restless legs.  Patient was craving meat.  Discussed need to see GI consult if iron stores continue to decline.  Patient was seen in consult on 05/26/2017 by Dr. Rubie Maid pelvic pain and menorrhagia.  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  Patient was scheduled for a pelvic ultrasound to assess her pain in the setting of a history of known ovarian cysts.  Dr. Marcelline Mates also discussed the possibility of an endometrial biopsy if the patient's heavy bleeding continue. She noted that the patient was at increased risk for endometrial hyperplasia.  Patient had routine labs done on 07/28/2017 that demonstrated a low vitamin D level of 11.  She was subsequently started on Drisdol 50,000 units weekly times 12 weeks.  Hemoglobin A1c elevated to 6.3.  Patient was offered metformin, however she declined.  TSH was in normal range.  Patient has recently been referred to bariatric surgeon by her primary care provider.  CBC done on 07/30/2017 revealed a WBC of 9.4 with an [place] of 5600.  Hemoglobin 11.3, hematocrit 34.0, MCV 71.7, and platelets 252,000.  Ferritin has decreased to 67 (previously 94).   Symptomatically, patient is doing well. She notes that she feels better overall. Patient is sleeping better. Her previously reported reflux has improved on prescribed PPI therapy. Patient is trying to be more active. She is walking up to 2 miles a day in attempts to lose weight. She is currently off of her [place]. Patient notes that PCP is wanting to start  her on Metformin, however she has been resistant. Weight is down 2 pound since her last visit.   She denies recurrent episodes of vaginal bleeding. She has not appreciated any obvious gastrointestinal bleeding. Patient denies pain in the clinic today.   Past Medical History:  Diagnosis Date  . Anemia     Past Surgical History:  Procedure Laterality Date  . CESAREAN SECTION    . CHOLECYSTECTOMY    . TONSILECTOMY/ADENOIDECTOMY WITH MYRINGOTOMY      Family History  Problem Relation Age of Onset  . Hypertension Mother   . Depression Mother   . Hyperlipidemia Mother   . Depression Sister   . Anxiety disorder Sister   . Anxiety disorder Brother   . Eczema Daughter   . COPD Maternal Grandmother   . Emphysema Maternal Grandmother   . Cancer Maternal Grandfather        stomach cancer    Social History:  reports that she has never smoked. She has never used smokeless tobacco. She reports that she drinks alcohol. She reports that she does not use drugs.  Patient has never smoked. She drinks on a very infrequent basis. 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 Patient denies known exposures to radiation or toxins. She lives in [place].  The patient is alone today.   Allergies: No Known Allergies  Current Medications: Current Outpatient Medications  Medication Sig Dispense Refill  . acetaminophen (TYLENOL) 325 MG tablet Take 325 mg by mouth every 6 (six)  hours as needed.    . bismuth subsalicylate (PEPTO BISMOL) 262 MG/15ML suspension Take 30 mLs by mouth every 6 (six) hours as needed.    . cyclobenzaprine (FLEXERIL) 5 MG tablet Take 1 tablet (5 mg total) by mouth every 8 (eight) hours as needed for muscle spasms. 30 tablet 0  . omeprazole (PRILOSEC) 20 MG capsule TAKE 1 CAPSULE BY MOUTH EVERY DAY 30 capsule 3  . simethicone (GAS-X) 80 MG chewable tablet Chew 1 tablet (80 mg total) every 6 (six) hours as needed by mouth for flatulence. 30 tablet 0  . Vitamin D,  Ergocalciferol, (DRISDOL) 50000 units CAPS capsule Take 1 capsule (50,000 Units total) by mouth every 7 (seven) days. 12 capsule 0  . Iron-Vitamin C 65-125 MG TABS Take 1 tablet by mouth daily. 30 tablet 3   No current facility-administered medications for this visit.     Review of Systems:  GENERAL:  Feels "better". More active in attempts to lose weight. No fevers or sweats.  Weight down 2 pounds.  PERFORMANCE STATUS (ECOG):  1 HEENT:  No visual changes, runny nose, sore throat, mouth sores or tenderness. Lungs:  Shortness of breath with walking; improved.  No cough.  No hemoptysis. Cardiac:  No chest pain, palpitations, orthopnea, or PND. GI:  Constipation; improved. GERD; improved. No nausea, vomiting, diarrhea, melena or hematochezia. GU:  No urgency, frequency, dysuria, or hematuria.  Menses 3 days each month and "normal" (not heavy). Musculoskeletal:  Back pain at times secondary to weight.  No joint pain.  No muscle tenderness. Extremities:  No pain or swelling. Skin:  No rashes or skin changes. Neuro:  No headache, numbness or weakness, balance or coordination issues. Endocrine:  Diabetes.  No thyroid issues, hot flashes or night sweats. Psych:  No mood changes, depression or anxiety. Pain:  No focal pain. Review of systems:  All other systems reviewed and found to be negative.  Physical Exam: Blood pressure 118/78, pulse 88, temperature (!) 97.5 F (36.4 C), temperature source Tympanic, resp. rate 18, weight 278 lb 9 oz (126.4 kg), last menstrual period 07/01/2017. GENERAL:  Well developed, well nourished, heavyset woman sitting comfortably in the exam room in no acute distress. MENTAL STATUS:  Alert and oriented to person, place and time. HEAD:  Brown hair in a bun.  Normocephalic, atraumatic, face symmetric, no Cushingoid features. EYES:  Brown eyes.  Pupils equal round and reactive to light and accomodation.  No conjunctivitis or scleral icterus. ENT:  Oropharynx clear  without lesion.  Tongue normal. Mucous membranes moist.  RESPIRATORY:  Clear to auscultation without rales, wheezes or rhonchi. CARDIOVASCULAR:  Regular rate and rhythm without murmur, rub or gallop. ABDOMEN:  Soft, non-tender, with active bowel sounds, and no appreciable hepatosplenomegaly.  No masses. SKIN:  Right shoulder fairy tattoo.  No rashes, ulcers or lesions. EXTREMITIES: No edema, no skin discoloration or tenderness.  No palpable cords. LYMPH NODES: No palpable cervical, supraclavicular, axillary or inguinal adenopathy  NEUROLOGICAL: Unremarkable. PSYCH:  Appropriate.   Appointment on 07/30/2017  Component Date Value Ref Range Status  . WBC 07/30/2017 9.4  3.6 - 11.0 K/uL Final  . RBC 07/30/2017 4.74  3.80 - 5.20 MIL/uL Final  . Hemoglobin 07/30/2017 11.3* 12.0 - 16.0 g/dL Final  . HCT 07/30/2017 34.0* 35.0 - 47.0 % Final  . MCV 07/30/2017 71.7* 80.0 - 100.0 fL Final  . MCH 07/30/2017 23.8* 26.0 - 34.0 pg Final  . MCHC 07/30/2017 33.2  32.0 - 36.0 g/dL Final  .  RDW 07/30/2017 16.9* 11.5 - 14.5 % Final  . Platelets 07/30/2017 352  150 - 440 K/uL Final  . Neutrophils Relative % 07/30/2017 60  % Final  . Neutro Abs 07/30/2017 5.6  1.4 - 6.5 K/uL Final  . Lymphocytes Relative 07/30/2017 31  % Final  . Lymphs Abs 07/30/2017 2.9  1.0 - 3.6 K/uL Final  . Monocytes Relative 07/30/2017 8  % Final  . Monocytes Absolute 07/30/2017 0.7  0.2 - 0.9 K/uL Final  . Eosinophils Relative 07/30/2017 1  % Final  . Eosinophils Absolute 07/30/2017 0.1  0 - 0.7 K/uL Final  . Basophils Relative 07/30/2017 0  % Final  . Basophils Absolute 07/30/2017 0.0  0 - 0.1 K/uL Final   Performed at [facility], [address], [place], [place] [address]  . Ferritin 07/30/2017 67  11 - 307 ng/mL Final   Performed at [facility], [address] [place], [place] [address]    Assessment:  AALIYHA MUMFORD is a 32 y.o. female with a history of chronic microcytic anemia due to iron  deficiency.  Labs dating back to 08/04/2012 revealed a hematocrit that has ranged between 29.7 - 33.5 and hemoglobin 8.2 -  10.5.  She has persistent microcytic indices (62.1 - 67).  Iron saturation have been low (6%).  B12 and folate have been normal.  Diet appears good.  She has a history of powder pica.  She has been intolerant of oral iron in the past.  Labs on 02/02/2017 included a hematocrit 32.9, hemoglobin 9.7, and MCV 65.3.  Normal labs included:  TSH, creatinine, and LFTs.  Ferritin was 25 with an iron saturation of 7% and a TIBC of 305. Vitamin D, 25 hydroxy was low.  Labs on 02/13/2017 revealed low reticulocyte count(1.5%).  B12 and folate were normal. Sedimentation rate was 31.  Urinalysis revealed no hematuria.  Hemoglobin electrophoresis revealed a normal adult hemoglobin.  Hemoglobin A2 was 1.5% and hemoglobin A 98.5%.  Low hemoglobin A2 may indicate an alpha thalassemia or iron deficiency. Guaiac cards are pending.   Ferritin has been followed: 34 on 08/01/2015, 25 on 02/02/2017, 94 on 04/06/2017, and 64 on 07/30/2017.  She received Venofer weekly x 4 (02/20/2017 - 03/10/2017).    She denies any prior EGD or colonoscopy.  She was diagnosed with an asymptomatic ulcer at age 73.  She required PRBC transfusion at age 21 and 71.  Symptomatically, is feeling better overall. She is actively trying to lose weight with lifestyle modifications. She is more active; walking 2 miles per day. Constipation and reflux has improved. She is off [place]. Patient denies bleeding; no hematochezia, melena, or gross hematuria. Exam is stable.   Plan: 1.  Review labs from 07/30/2017.   Hemoglobin continues to improve.  MCV has improved from 69.2 to 71.7. Hemoglobin A2 slightly elevated back in 02/2017. While alpha-thalassemia is still possible, given her improving RBC indices, iron deficiency is more likely.  2.  Review iron stores.  Ferritin 67 on 07/30/2017. No Venofer today. Will try Vitron-C daily at  home, with plans to recheck labs in the future. Rx sent in for: Vitron C 65/125 mg daily (Disp #30 r3).  3.  Discuss previous PUD. Revisited the need to see GI for EGD and colonoscopy given her history of ulcer and iron deficiency. Patient willing to be seen in consult. Will refer to Dr. Allen Norris.  4.  RTC in 6 weeks for labs (CBC with diff, ferritin).  5.  RTC in 3 months for MD assessment, labs (CBC with diff, ferritin-day before) and +/- Venofer.   Honor Loh, NP  07/31/2017, 1:58 PM

## 2017-07-31 NOTE — Patient Instructions (Signed)
Will send Rx for iron. If insurance will not pat, try over the counter. Vitron-C (slow release iron).

## 2017-08-07 ENCOUNTER — Ambulatory Visit: Payer: Self-pay | Admitting: *Deleted

## 2017-08-07 NOTE — Telephone Encounter (Signed)
  Called in c/o ear fullness and draining clear fluid and a stuffy nose for 3 days now along with body aches.  There were no appts available at [facility] with any of the providers.    I suggested she go to an urgent care center or check with her pharmacist for some OTC recommendations as she has not tried/used any OTC for her symptoms.     I offered to get her an appt for Monday but she decided to check with her pharmacist or go to urgent care instead.    "I feel too bad to wait that long".    I let her know to call us back if she needed to.   She verbalized understanding and was agreeable to this plan.    Reason for Disposition . [1] Sinus congestion (pressure, fullness) AND [2] present > 10 days  Answer Assessment - Initial Assessment Questions 1. ONSET: "When did the nasal discharge start?"      Ears are draining and I'm having body aches that started 3 days ago. 2. AMOUNT: "How much discharge is there?"      My nose is stopped up with congested.  Hard to breath because I'm congested.   My ears are draining clear.  I've been flushing them out.   No pain in ears but feel full. 3. COUGH: "Do you have a cough?" If yes, ask: "Describe the color of your sputum" (clear, white, yellow, green)     No   I took Alka Setzer it didn't help. 4. RESPIRATORY DISTRESS: "Describe your breathing."      See above 5. FEVER: "Do you have a fever?" If so, ask: "What is your temperature, how was it measured, and when did it start?"     No 6. SEVERITY: "Overall, how bad are you feeling right now?" (e.g., doesn't interfere with normal activities, staying home from school/work, staying in bed)      I'm having body aches 7. OTHER SYMPTOMS: "Do you have any other symptoms?" (e.g., sore throat, earache, wheezing, vomiting)     None of the above. 8. PREGNANCY: "Is there any chance you are pregnant?" "When was your last menstrual period?"     Possible but probably not.  Protocols used: COMMON  COLD-A-AH

## 2017-09-09 ENCOUNTER — Other Ambulatory Visit: Payer: Self-pay | Admitting: Family Medicine

## 2017-09-11 ENCOUNTER — Inpatient Hospital Stay: Payer: BC Managed Care – PPO | Attending: Hematology and Oncology

## 2017-09-11 ENCOUNTER — Other Ambulatory Visit: Payer: BC Managed Care – PPO

## 2017-09-24 ENCOUNTER — Ambulatory Visit: Payer: BC Managed Care – PPO | Admitting: Gastroenterology

## 2017-10-21 ENCOUNTER — Encounter: Payer: Self-pay | Admitting: Family Medicine

## 2017-10-21 ENCOUNTER — Ambulatory Visit: Payer: BC Managed Care – PPO | Admitting: Family Medicine

## 2017-10-21 VITALS — BP 124/82 | HR 92 | Temp 98.2°F | Resp 18 | Ht 62.0 in | Wt 272.1 lb

## 2017-10-21 DIAGNOSIS — N3001 Acute cystitis with hematuria: Secondary | ICD-10-CM | POA: Diagnosis not present

## 2017-10-21 LAB — POCT URINALYSIS DIPSTICK
BILIRUBIN UA: NEGATIVE
Glucose, UA: NEGATIVE
Ketones, UA: NEGATIVE
NITRITE UA: POSITIVE
PH UA: 7.5 (ref 5.0–8.0)
Protein, UA: POSITIVE — AB
Spec Grav, UA: 1.015 (ref 1.010–1.025)
UROBILINOGEN UA: 0.2 U/dL

## 2017-10-21 MED ORDER — SULFAMETHOXAZOLE-TRIMETHOPRIM 800-160 MG PO TABS: 1.0000 | ORAL_TABLET | Freq: Two times a day (BID) | ORAL | 0 refills | Status: AC

## 2017-10-21 NOTE — Patient Instructions (Signed)

## 2017-10-21 NOTE — Progress Notes (Signed)
Name: Sherri Robertson   MRN: [id number]    DOB: 1985/11/17   Date:10/21/2017       Progress Note  Subjective  Chief Complaint  Chief Complaint  Patient presents with  . Urinary Tract Infection    burning, pressure    HPI  Pt presents with concern for UTI.  Just finished her menses, and has been feeling like she might have the beginning of a UTI yesterday when she had increased pressure and frequency.  Today she continues to have pressure and urinary frequency, some hematuria, and dysuria. Denies back pain, or history of kidney stones, no fevers/chills, body aches.  Patient Active Problem List   Diagnosis Date Noted  . Prediabetes 02/05/2017  . 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 Preventative health care 01/22/2017  . Thyroid nodule 01/22/2017  . Vitamin D deficiency 01/22/2017  . Acanthosis nigricans 08/05/2015  . Thyromegaly 08/05/2015  . Abnormal cervical Pap smear with positive HPV DNA test 01/26/2015  . Anxiety and depression 01/26/2015  . Dysmenorrhea 01/26/2015  . Low back pain 01/26/2015  . Fibroid 01/26/2015  . Other fatigue 01/26/2015  . Anemia, iron deficiency 09/21/2009  . Morbid obesity ([place]) 07/27/2009    Social History   Tobacco Use  . Smoking status: Never Smoker  . Smokeless tobacco: Never Used  Substance Use Topics  . Alcohol use: Yes    Comment: occassional     Current Outpatient Medications:  .  acetaminophen (TYLENOL) 325 MG tablet, Take 325 mg by mouth every 6 (six) hours as needed., Disp: , Rfl:  .  bismuth subsalicylate (PEPTO BISMOL) 262 MG/15ML suspension, Take 30 mLs by mouth every 6 (six) hours as needed., Disp: , Rfl:  .  cyclobenzaprine (FLEXERIL) 5 MG tablet, Take 1 tablet (5 mg total) by mouth every 8 (eight) hours as needed for muscle spasms. (Patient not taking: Reported on 10/21/2017), Disp: 30 tablet, Rfl: 0 .  Iron-Vitamin C 65-125 MG TABS, Take 1 tablet by mouth daily. (Patient not taking: Reported on 10/21/2017), Disp: 30 tablet, Rfl:  3 .  omeprazole (PRILOSEC) 20 MG capsule, TAKE 1 CAPSULE BY MOUTH EVERY DAY (Patient not taking: Reported on 10/21/2017), Disp: 30 capsule, Rfl: 3 .  simethicone (GAS-X) 80 MG chewable tablet, Chew 1 tablet (80 mg total) every 6 (six) hours as needed by mouth for flatulence. (Patient not taking: Reported on 10/21/2017), Disp: 30 tablet, Rfl: 0 .  Vitamin D, Ergocalciferol, (DRISDOL) 50000 units CAPS capsule, Take 1 capsule (50,000 Units total) by mouth every 7 (seven) days. (Patient not taking: Reported on 10/21/2017), Disp: 12 capsule, Rfl: 0  No Known Allergies  ROS  Constitutional: Negative for fever or weight change.  Respiratory: Negative for cough and shortness of breath.   Cardiovascular: Negative for chest pain or palpitations.  Gastrointestinal: Negative for abdominal pain, no bowel changes.  Musculoskeletal: Negative for gait problem or joint swelling.  Skin: Negative for rash.  Neurological: Negative for dizziness or headache.  No other specific complaints in a complete review of systems (except as listed in HPI above).  Objective  Vitals:   10/21/17 0938  BP: 124/82  Pulse: 92  Resp: 18  Temp: 98.2 F (36.8 C)  TempSrc: Oral  SpO2: 98%  Weight: 272 lb 1.6 oz (123.4 kg)  Height: 5\' 2"  (1.575 m)   Body mass index is 49.77 kg/m.  Nursing Note and Vital Signs reviewed.  Physical Exam  Constitutional: Patient appears well-developed and well-nourished. Obese No distress.  HEENT: head atraumatic,  normocephalic Cardiovascular: Normal rate, regular rhythm, S1/S2 present.  No murmur or rub heard. No BLE edema. Pulmonary/Chest: Effort normal and breath sounds clear. No respiratory distress or retractions. Abdominal: Soft and non-tender, bowel sounds present x4 quadrants.  No CVA Tenderness Psychiatric: Patient has a normal mood and affect. behavior is normal. Judgment and thought content normal.  Results for orders placed or performed in visit on 10/21/17 (from the past 72  hour(s))  POCT urinalysis dipstick     Status: Abnormal   Collection Time: 10/21/17  9:41 AM  Result Value Ref Range   Color, UA gold    Clarity, UA cloudy    Glucose, UA Negative Negative   Bilirubin, UA negative    Ketones, UA negative    Spec Grav, UA 1.015 1.010 - 1.025   Blood, UA large    pH, UA 7.5 5.0 - 8.0   Protein, UA Positive (A) Negative   Urobilinogen, UA 0.2 0.2 or 1.0 E.U./dL   Nitrite, UA positive    Leukocytes, UA Large (3+) (A) Negative   Appearance dark    Odor none     Assessment & Plan  1. Acute cystitis with hematuria - POCT urinalysis dipstick - sulfamethoxazole-trimethoprim (BACTRIM DS,SEPTRA DS) 800-160 MG tablet; Take 1 tablet by mouth 2 (two) times daily for 3 days.  Dispense: 6 tablet; Refill: 0 - Urine Culture - Advised to increase fluid intake significantly.  - If not improving in 1-2 days, she needs to return to clinic for further evaluation. - Follow up in 2 weeks for nurse visit for UA recheck to ensure resolution of proteinuria and hematuria. -Red flags and when to present for emergency care or RTC including fever >101.66F, chills, back pain, body aches, chest pain, shortness of breath, new/worsening/un-resolving symptoms, reviewed with patient at time of visit. Follow up and care instructions discussed and provided in AVS.

## 2017-10-23 LAB — URINE CULTURE
MICRO NUMBER: 90817076
SPECIMEN QUALITY: ADEQUATE

## 2017-10-25 ENCOUNTER — Other Ambulatory Visit: Payer: Self-pay | Admitting: Urgent Care

## 2017-10-26 ENCOUNTER — Telehealth: Payer: Self-pay | Admitting: Family Medicine

## 2017-10-26 DIAGNOSIS — B3731 Acute candidiasis of vulva and vagina: Secondary | ICD-10-CM

## 2017-10-26 DIAGNOSIS — B373 Candidiasis of vulva and vagina: Secondary | ICD-10-CM

## 2017-10-26 MED ORDER — FLUCONAZOLE 150 MG PO TABS: 150.0000 mg | ORAL_TABLET | Freq: Once | ORAL | 0 refills | Status: AC

## 2017-10-26 NOTE — Telephone Encounter (Signed)
If not improved in 3 days with single dose of diflucan, she needs to come in to be seen. Thanks!

## 2017-10-26 NOTE — Telephone Encounter (Signed)
Please advise 

## 2017-10-26 NOTE — Telephone Encounter (Signed)
Copied from [place] [id number]. Topic: Quick Communication - See Telephone Encounter >> Oct 26, 2017  9:03 AM Bea Graff, NT wrote: CRM for notification. See Telephone encounter for: 10/26/17. Pt calling and states that she was on medication for a UTI and now believes she has a yeast infection and would to see if diflucan can be called in for this. [id number] - [place], [place] - [address] [phone] (Phone) [phone] (Fax)

## 2017-10-27 NOTE — Telephone Encounter (Signed)
Patient notified of script.

## 2017-10-28 ENCOUNTER — Inpatient Hospital Stay: Payer: BC Managed Care – PPO | Attending: Hematology and Oncology

## 2017-10-29 ENCOUNTER — Other Ambulatory Visit: Payer: Self-pay | Admitting: *Deleted

## 2017-10-29 DIAGNOSIS — D509 Iron deficiency anemia, unspecified: Secondary | ICD-10-CM

## 2017-10-30 ENCOUNTER — Inpatient Hospital Stay: Payer: BC Managed Care – PPO

## 2017-10-30 ENCOUNTER — Inpatient Hospital Stay: Payer: BC Managed Care – PPO | Admitting: Urgent Care

## 2017-10-30 NOTE — Progress Notes (Deleted)
[facility] day:  10/30/2017  Chief Complaint: Sherri Robertson is a 32 y.o. female with iron deficiency anemia who is seen for 3 month assessment.  HPI:   The patient was last seen in the hematology clinic on 07/31/2017.  At that time, patient was feeling better overall.  She noted that she was sleeping better and that her reflux had improved.  Patient was trying to be more active; walking 2 miles a day and attempts to lose weight.  Patient motivated to lose weight as her PCP is going to start her on metformin. Patient no longer taking Saxenda.  She denied bleeding.  Exam unremarkable.  Hemoglobin 11.3, hematocrit 34.0, MCV 71.7, platelets 352,000.  Ferritin was stable at 67.  Trial of oral Vitron-C was prescribed. Patient agreed to seeing gastroenterology; referral made.  Patient was scheduled for 6-week follow-up labs, however she did not return to the clinic for the ordered studies.  Additionally, to date, patient has not been seen in consult by gastroenterology for evaluation.  Patient was seen on 10/21/2017 by Raelyn Ensign, NP (PCP). notes reviewed.  Patient presented with urinary complaints.  UA was (+) for infection. C&S grew out > 1000,000 CFU/mL Proteus mirabilis.  Patient was started on Bactrim DS twice daily x3 days.  Patient scheduled to follow-up in 2 weeks for a recheck.  Patient return to call to the clinic on 10/26/2017 reporting symptoms consistent with vulvovaginal candidiasis.  She was prescribed a single dose of Diflucan.  In the interim,  Past Medical History:  Diagnosis Date  . Anemia     Past Surgical History:  Procedure Laterality Date  . CESAREAN SECTION    . CHOLECYSTECTOMY    . TONSILECTOMY/ADENOIDECTOMY WITH MYRINGOTOMY      Family History  Problem Relation Age of Onset  . Hypertension Mother   . Depression Mother   . Hyperlipidemia Mother   . Depression Sister   . Anxiety disorder Sister   . Anxiety disorder  Brother   . Eczema Daughter   . COPD Maternal Grandmother   . Emphysema Maternal Grandmother   . Cancer Maternal Grandfather        stomach cancer    Social History:  reports that she has never smoked. She has never used smokeless tobacco. She reports that she drinks alcohol. She reports that she does not use drugs.  Patient has never smoked. She drinks on a very infrequent basis. 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 Patient denies known exposures to radiation or toxins. She lives in [place].  The patient is alone today.   Allergies: No Known Allergies  Current Medications: Current Outpatient Medications  Medication Sig Dispense Refill  . acetaminophen (TYLENOL) 325 MG tablet Take 325 mg by mouth every 6 (six) hours as needed.    . bismuth subsalicylate (PEPTO BISMOL) 262 MG/15ML suspension Take 30 mLs by mouth every 6 (six) hours as needed.    . cyclobenzaprine (FLEXERIL) 5 MG tablet Take 1 tablet (5 mg total) by mouth every 8 (eight) hours as needed for muscle spasms. 30 tablet 0  . IRON 100/C 100-250 MG TABS TAKE 1 TABLET BY MOUTH EVERY DAY 90 each 1  . omeprazole (PRILOSEC) 20 MG capsule TAKE 1 CAPSULE BY MOUTH EVERY DAY 30 capsule 3  . simethicone (GAS-X) 80 MG chewable tablet Chew 1 tablet (80 mg total) every 6 (six) hours as needed by mouth for flatulence. 30 tablet 0  .  Vitamin D, Ergocalciferol, (DRISDOL) 50000 units CAPS capsule Take 1 capsule (50,000 Units total) by mouth every 7 (seven) days. (Patient not taking: Reported on 10/21/2017) 12 capsule 0   No current facility-administered medications for this visit.     Review of Systems  Constitutional: Negative for diaphoresis, fever, malaise/fatigue and weight loss.  HENT: Negative.   Eyes: Negative.   Respiratory: Positive for shortness of breath (exertional - improved). Negative for cough, hemoptysis and sputum production.   Cardiovascular: Negative for chest pain, palpitations, orthopnea, leg  swelling and PND.  Gastrointestinal: Positive for constipation (improved) and heartburn (GERD - improved on PPI). Negative for abdominal pain, blood in stool, diarrhea, melena, nausea and vomiting.  Genitourinary: Negative for dysuria, frequency, hematuria and urgency.       Menses 3 days each month and "normal" (not heavy).  Musculoskeletal: Positive for back pain (chronic - secondary to weight). Negative for falls, joint pain and myalgias.  Skin: Negative for itching and rash.  Neurological: Negative for dizziness, tremors, weakness and headaches.  Endo/Heme/Allergies: Does not bruise/bleed easily.       Diabetes  Psychiatric/Behavioral: Negative for depression, memory loss and suicidal ideas. The patient is not nervous/anxious and does not have insomnia.   All other systems reviewed and are negative.  Performance status (ECOG): 1 - Symptomatic but completely ambulatory  Vital Signs There were no vitals taken for this visit.  Physical Exam  Constitutional: She is oriented to person, place, and time and well-developed, well-nourished, and in no distress.  HENT:  Head: Normocephalic and atraumatic.  Black hair  Eyes: Pupils are equal, round, and reactive to light. EOM are normal. No scleral icterus.  Brown eyes  Neck: Normal range of motion. Neck supple. No tracheal deviation present. No thyromegaly present.  Cardiovascular: Normal rate, regular rhythm and normal heart sounds. Exam reveals no gallop and no friction rub.  No murmur heard. Pulmonary/Chest: Effort normal and breath sounds normal. No respiratory distress. She has no wheezes. She has no rales.  Abdominal: Soft. Bowel sounds are normal. She exhibits no distension. There is no tenderness.  Musculoskeletal: Normal range of motion. She exhibits no edema or tenderness.  Lymphadenopathy:    She has no cervical adenopathy.    She has no axillary adenopathy.       Right: No inguinal and no supraclavicular adenopathy present.        Left: No inguinal and no supraclavicular adenopathy present.  Neurological: She is alert and oriented to person, place, and time.  Skin: Skin is warm and dry. No rash noted. No erythema.  Psychiatric: Mood, affect and judgment normal.  Nursing note and vitals reviewed.   No visits with results within 3 Day(s) from this visit.  Latest known visit with results is:  Office Visit on 10/21/2017  Component Date Value Ref Range Status  . Color, UA 10/21/2017 gold   Final  . Clarity, UA 10/21/2017 cloudy   Final  . Glucose, UA 10/21/2017 Negative  Negative Final  . Bilirubin, UA 10/21/2017 negative   Final  . Ketones, UA 10/21/2017 negative   Final  . Spec Grav, UA 10/21/2017 1.015  1.010 - 1.025 Final  . Blood, UA 10/21/2017 large   Final  . pH, UA 10/21/2017 7.5  5.0 - 8.0 Final  . Protein, UA 10/21/2017 Positive* Negative Final  . Urobilinogen, UA 10/21/2017 0.2  0.2 or 1.0 E.U./dL Final  . Nitrite, UA 10/21/2017 positive   Final  . Leukocytes, UA 10/21/2017 Large (3+)* Negative  Final  . Appearance 10/21/2017 dark   Final  . Odor 10/21/2017 none   Final  . MICRO NUMBER: 10/21/2017 [id number]   Final  . SPECIMEN QUALITY: 10/21/2017 ADEQUATE   Final  . Sample Source 10/21/2017 URINE   Final  . STATUS: 10/21/2017 FINAL   Final  . ISOLATE 1: 10/21/2017 Proteus mirabilis*  Final   Greater than 100,000 CFU/mL of Proteus mirabilis    Assessment:  Sherri Robertson is a 32 y.o. female with a history of chronic microcytic anemia due to iron deficiency.  Labs dating back to 08/04/2012 revealed a hematocrit that has ranged between 29.7 - 33.5 and hemoglobin 8.2 -  10.5.  She has persistent microcytic indices (62.1 - 67).  Iron saturation have been low (6%).  B12 and folate have been normal.  Diet appears good.  She has a history of powder pica.  She has been intolerant of oral iron in the past.  Labs on 02/02/2017 included a hematocrit 32.9, hemoglobin 9.7, and MCV 65.3.  Normal labs included:   TSH, creatinine, and LFTs.  Ferritin was 25 with an iron saturation of 7% and a TIBC of 305. Vitamin D, 25 hydroxy was low.  Labs on 02/13/2017 revealed low reticulocyte count(1.5%).  B12 and folate were normal. Sedimentation rate was 31.  Urinalysis revealed no hematuria.  Hemoglobin electrophoresis revealed a normal adult hemoglobin.  Hemoglobin A2 was 1.5% and hemoglobin A 98.5%.  Low hemoglobin A2 may indicate an alpha thalassemia or iron deficiency. Guaiac cards are pending.   Ferritin has been followed: 34 on 08/01/2015, 25 on 02/02/2017, 94 on 04/06/2017, and 64 on 07/30/2017.  She received Venofer weekly x 4 (02/20/2017 - 03/10/2017). Trial of oral Vitron-C started on 07/31/2017 [id number]  She denies any prior EGD or colonoscopy.  She was diagnosed with an asymptomatic ulcer at age 37.  She required PRBC transfusion at age 48 and 60.  Symptomatically,  is feeling better overall. She is actively trying to lose weight with lifestyle modifications. She is more active; walking 2 miles per day. Constipation and reflux has improved. She is off [place]. Patient denies bleeding; no hematochezia, melena, or gross hematuria. Exam is stable.   Plan: 1.    Review labs from 07/30/2017.   Hemoglobin continues to improve.  MCV has improved from 69.2 to 71.7. Hemoglobin A2 slightly elevated back in 02/2017. While alpha-thalassemia is still possible, given her improving RBC indices, iron deficiency is more likely.  2.  Review iron stores.  Ferritin 67 on 07/30/2017. No Venofer today. Will try Vitron-C daily at home, with plans to recheck labs in the future. Rx sent in for: Vitron C 65/125 mg daily (Disp #30 r3).  3.  Discuss previous PUD. Revisited the need to see GI for EGD and colonoscopy given her history of ulcer and iron deficiency. Patient willing to be seen in consult. Will refer to Dr. Allen Norris.  4.  RTC in 6 weeks for labs (CBC with diff, ferritin).  5.  RTC in 3 months for  MD assessment, labs (CBC with diff, ferritin-day before) and +/- Venofer.   Honor Loh, NP  10/30/2017, 8:12 AM

## 2017-11-04 ENCOUNTER — Other Ambulatory Visit: Payer: Self-pay

## 2017-11-04 ENCOUNTER — Encounter: Payer: Self-pay | Admitting: Gastroenterology

## 2017-11-04 ENCOUNTER — Ambulatory Visit: Payer: BC Managed Care – PPO | Admitting: Gastroenterology

## 2017-11-04 VITALS — BP 113/61 | HR 73 | Ht 62.0 in | Wt 275.5 lb

## 2017-11-04 DIAGNOSIS — D500 Iron deficiency anemia secondary to blood loss (chronic): Secondary | ICD-10-CM | POA: Diagnosis not present

## 2017-11-04 DIAGNOSIS — R140 Abdominal distension (gaseous): Secondary | ICD-10-CM | POA: Diagnosis not present

## 2017-11-04 NOTE — Progress Notes (Signed)
Gastroenterology Consultation  Referring Provider:     Arnetha Courser, MD Primary Care Physician:  Arnetha Courser, MD Primary Gastroenterologist:  Dr. Allen Norris     Reason for Consultation:     Iron deficiency anemia        HPI:   Sherri Robertson is a 32 y.o. y/o female referred for consultation & management of iron deficiency anemia by Dr. Sanda Klein, Satira Anis, MD.  This patient comes in today after being found to have low iron stores at the end of last year with a iron saturation of 7%.  The patient also has had her ferritin checked in April that was 34.  She has had a history of being referred for bariatric surgery and prediabetes.  The patient was thought to possibly have thalassemia was sent to hematology.  The hematologist had noted that the patient has chronic microcytic anemia due to iron deficiency.  The patient had denied any EGD or colonoscopy in the past but had reported that she had symptomatic ulcer at 15 requiring a transfusion at the ages of 69 and 45.  Back in April the patient was recommended by hematology to see GI for an EGD and colonoscopy due to her history of ulcers and iron deficiency anemia.  The patient denies any heavy menses.  She also denies any sign of any non-GI bleeding.  She does have episodes of abdominal cramping and does report that she has loose bowel movements.  She states that she gets bloated.  The patient was asked about her milk intake and she reports that she drinks milk and eats cheese on a regular basis. The patient did have negative Hemoccult stool cards x3.  Past Medical History:  Diagnosis Date  . Anemia     Past Surgical History:  Procedure Laterality Date  . CESAREAN SECTION    . CHOLECYSTECTOMY    . TONSILECTOMY/ADENOIDECTOMY WITH MYRINGOTOMY      Prior to Admission medications   Medication Sig Start Date End Date Taking? Authorizing Provider  acetaminophen (TYLENOL) 325 MG tablet Take 325 mg by mouth every 6 (six) hours as needed.     [provider]  bismuth subsalicylate (PEPTO BISMOL) 262 MG/15ML suspension Take 30 mLs by mouth every 6 (six) hours as needed.    [provider]  cyclobenzaprine (FLEXERIL) 5 MG tablet Take 1 tablet (5 mg total) by mouth every 8 (eight) hours as needed for muscle spasms. 07/28/17   Arnetha Courser, MD  IRON 100/C 100-250 MG TABS TAKE 1 TABLET BY MOUTH EVERY DAY 10/25/17   Karen Kitchens, NP  omeprazole (PRILOSEC) 20 MG capsule TAKE 1 CAPSULE BY MOUTH EVERY DAY 07/12/17   Karen Kitchens, NP  simethicone (GAS-X) 80 MG chewable tablet Chew 1 tablet (80 mg total) every 6 (six) hours as needed by mouth for flatulence. 02/20/17   Earlie Server, MD  Vitamin D, Ergocalciferol, (DRISDOL) 50000 units CAPS capsule Take 1 capsule (50,000 Units total) by mouth every 7 (seven) days. Patient not taking: Reported on 10/21/2017 07/29/17   Arnetha Courser, MD    Family History  Problem Relation Age of Onset  . Hypertension Mother   . Depression Mother   . Hyperlipidemia Mother   . Depression Sister   . Anxiety disorder Sister   . Anxiety disorder Brother   . Eczema Daughter   . COPD Maternal Grandmother   . Emphysema Maternal Grandmother   . Cancer Maternal Grandfather  stomach cancer     Social History   Tobacco Use  . Smoking status: Never Smoker  . Smokeless tobacco: Never Used  Substance Use Topics  . Alcohol use: Yes    Comment: occassional  . Drug use: No    Allergies as of 11/04/2017  . (No Known Allergies)    Review of Systems:    All systems reviewed and negative except where noted in HPI.   Physical Exam:  BP 113/61   Pulse 73   Ht 5\' 2"  (1.575 m)   Wt 275 lb 8 oz (125 kg)   BMI 50.39 kg/m  No LMP recorded. General:   Alert,  Well-developed, well-nourished, pleasant and cooperative in NAD Head:  Normocephalic and atraumatic. Eyes:  Sclera clear, no icterus.   Conjunctiva pink. Ears:  Normal auditory acuity. Nose:  No deformity, discharge, or  lesions. Mouth:  No deformity or lesions,oropharynx pink & moist. Neck:  Supple; no masses or thyromegaly. Lungs:  Respirations even and unlabored.  Clear throughout to auscultation.   No wheezes, crackles, or rhonchi. No acute distress. Heart:  Regular rate and rhythm; no murmurs, clicks, rubs, or gallops. Abdomen:  Normal bowel sounds.  No bruits.  Soft, non-tender and non-distended without masses, hepatosplenomegaly or hernias noted.  No guarding or rebound tenderness.  Negative Carnett sign.   Rectal:  Deferred.  Msk:  Symmetrical without gross deformities.  Good, equal movement & strength bilaterally. Pulses:  Normal pulses noted. Extremities:  No clubbing or edema.  No cyanosis. Neurologic:  Alert and oriented x3;  grossly normal neurologically. Skin:  Intact without significant lesions or rashes.  No jaundice. Lymph Nodes:  No significant cervical adenopathy. Psych:  Alert and cooperative. Normal mood and affect.  Imaging Studies: No results found.  Assessment and Plan:   Sherri Robertson is a 32 y.o. y/o female who comes in today with a history of abdominal bloating with iron deficiency anemia who was evaluated by hematology.  The patient was recommended to see GI for possible EGD and colonoscopy.  The patient has bloating and abdominal cramps.  The patient has been told to avoid milk products for 1 week to see if her abdominal cramps and bloating decrease as does her loose bowel movements.  She will also be set up for an EGD and colonoscopy due to her iron deficiency anemia. I have discussed risks & benefits which include, but are not limited to, bleeding, infection, perforation & drug reaction.  The patient agrees with this plan & written consent will be obtained.     Lucilla Lame, MD. Marval Regal    Note: This dictation was prepared with Dragon dictation along with smaller phrase technology. Any transcriptional errors that result from this process are unintentional.

## 2017-11-06 ENCOUNTER — Other Ambulatory Visit: Payer: Self-pay

## 2017-11-06 DIAGNOSIS — D500 Iron deficiency anemia secondary to blood loss (chronic): Secondary | ICD-10-CM

## 2017-11-09 ENCOUNTER — Other Ambulatory Visit: Payer: Self-pay

## 2017-11-09 ENCOUNTER — Encounter: Payer: Self-pay | Admitting: Anesthesiology

## 2017-11-09 ENCOUNTER — Encounter: Payer: Self-pay | Admitting: *Deleted

## 2017-11-09 IMAGING — CR DG CHEST 2V
2 series · 2 of 2 positions shown · non-contrast
Comparison: None.

CLINICAL DATA: States R leg edema x 6 days, began feeling SOB
yesterday, and upper posterior chest pain began yesterday. Hx/o HTN.
Nonsmoker. Shielded.

EXAM:
CHEST  2 VIEW

[chest lat]
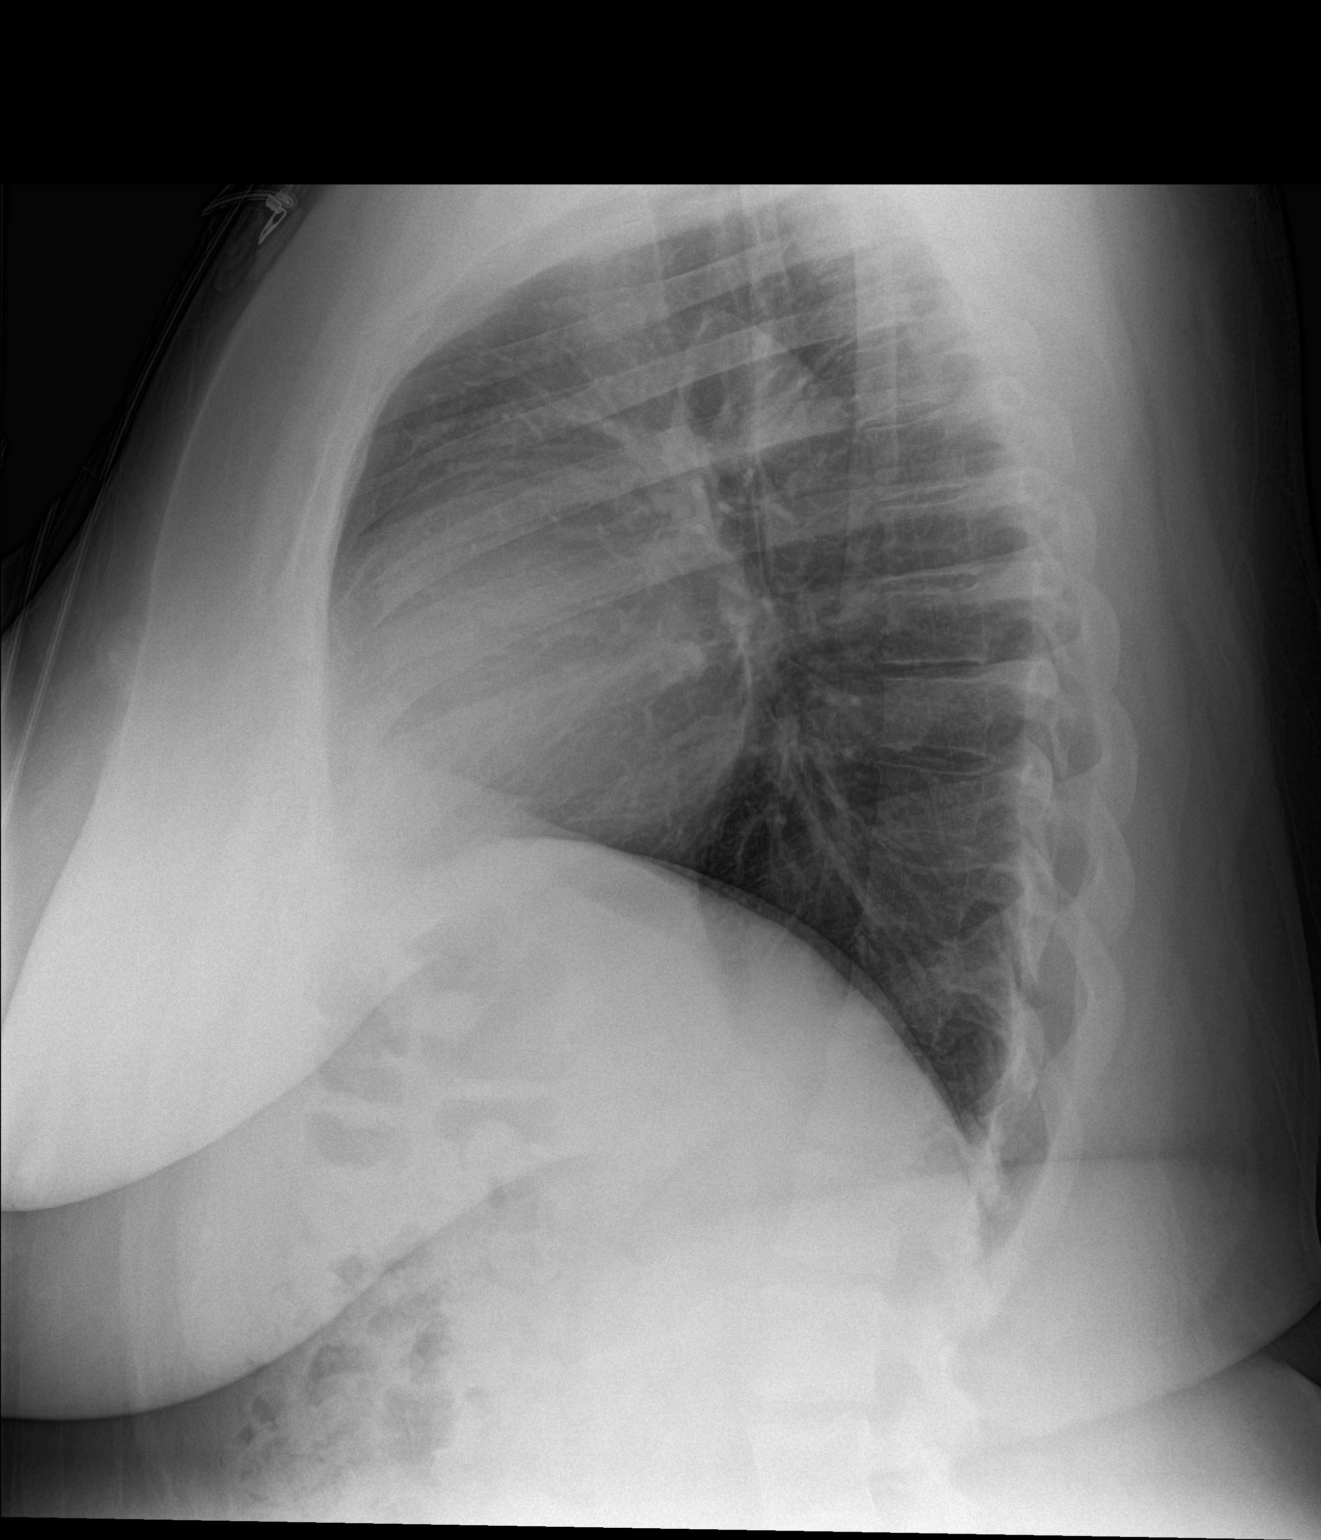

[chest pa]
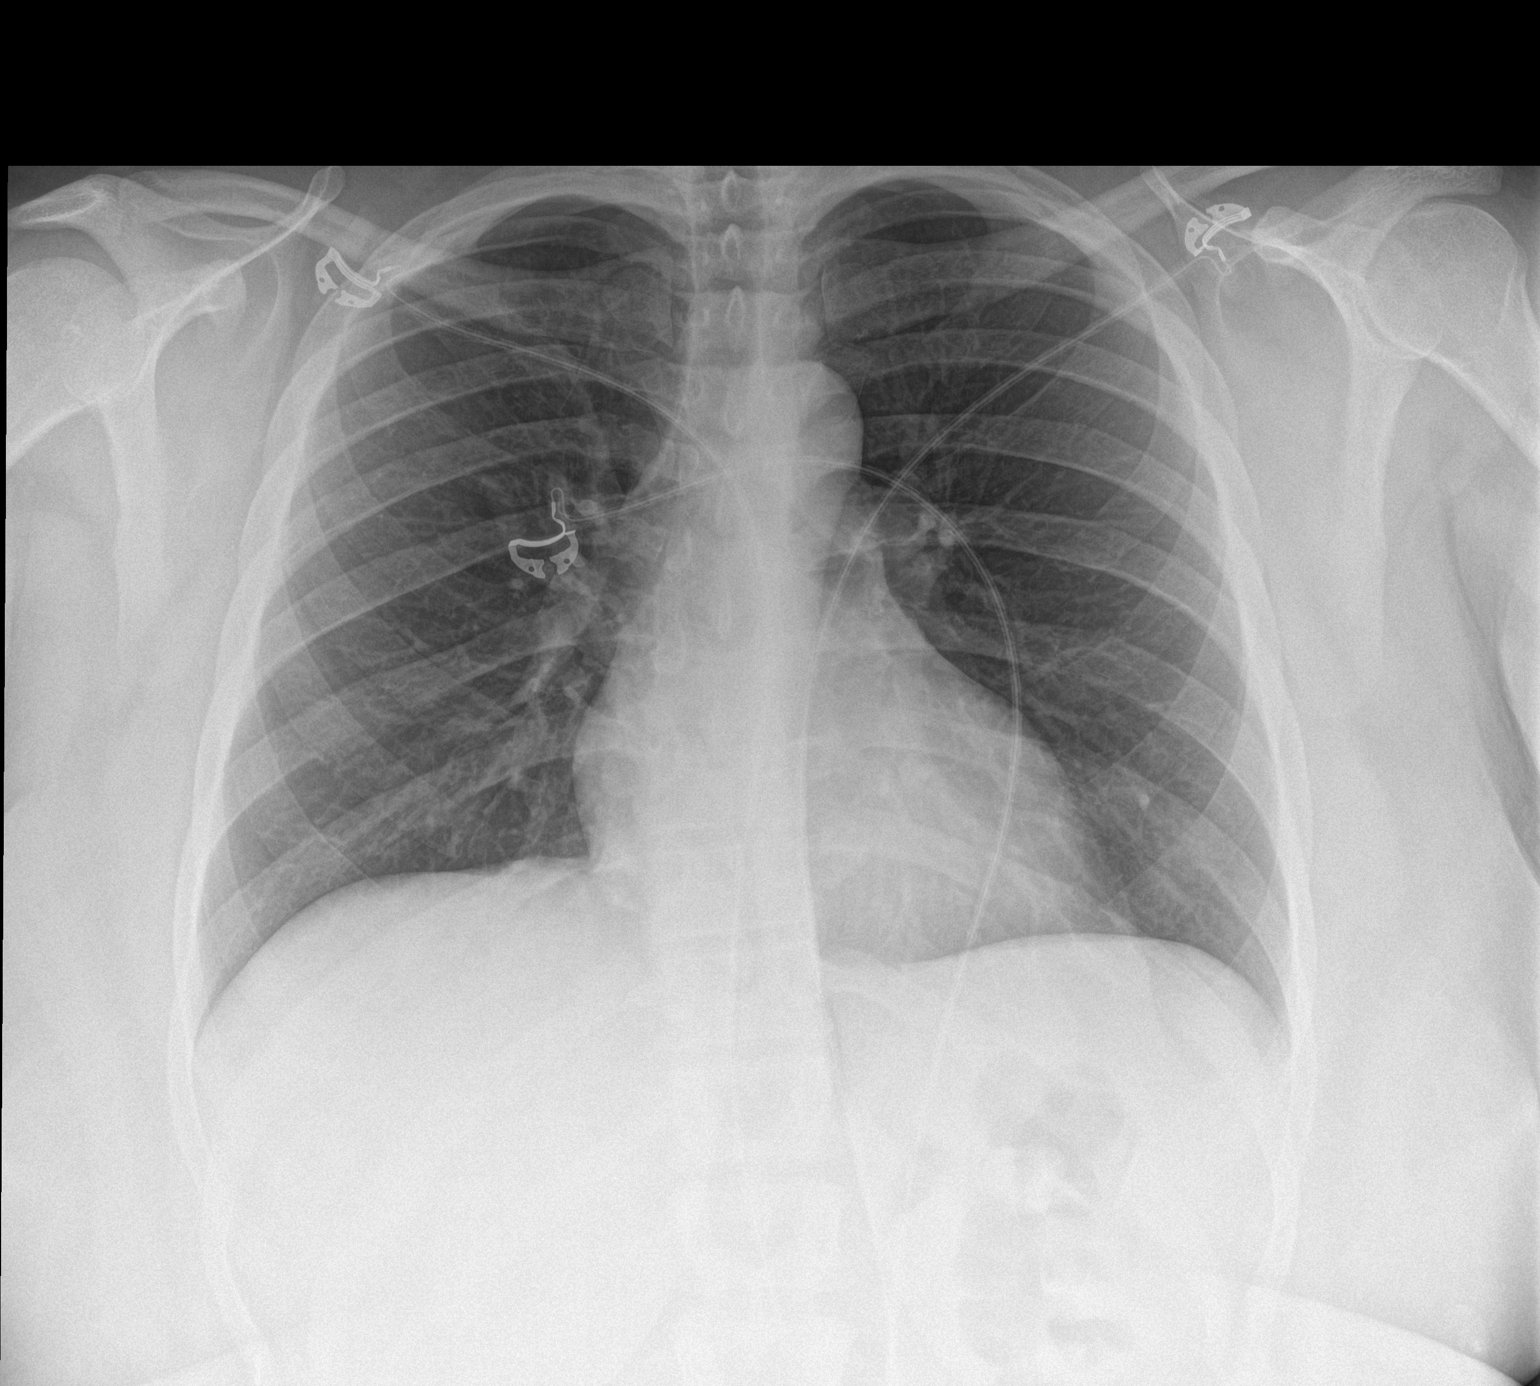

[2 of 2 positions shown; findings below may reference images not displayed]

FINDINGS: The heart size and mediastinal contours are within normal limits.
Both lungs are clear. No pleural effusion or pneumothorax. The
visualized skeletal structures are unremarkable.
IMPRESSION: Normal chest radiographs.

## 2017-11-12 ENCOUNTER — Ambulatory Visit: Admit: 2017-11-12 | Payer: BC Managed Care – PPO | Admitting: Gastroenterology

## 2017-11-12 SURGERY — COLONOSCOPY WITH PROPOFOL
Anesthesia: Choice

## 2017-11-16 ENCOUNTER — Other Ambulatory Visit: Payer: Self-pay | Admitting: Family Medicine

## 2017-11-18 ENCOUNTER — Other Ambulatory Visit: Payer: Self-pay | Admitting: Oncology

## 2017-11-24 ENCOUNTER — Ambulatory Visit: Payer: Self-pay | Admitting: Family Medicine

## 2017-11-24 NOTE — Telephone Encounter (Signed)
  She called in while walking around in [place] with her mother and 33 year old.   "I'm seeing spots before my eyes".   "This happens when I'm anemic".    She has a long standing history of anemia.   She had blood transfusions as well as iron transfusions.    She is feeling very weak.   Requesting an appt with Dr. Sanda Klein.   "She knows all about me and my history".  See triage notes.  I made her an appt with Dr. Sanda Klein for 7:40 in the morning ( 8/14).   I gave her instructions to go to the ED if she passes out/faints, becomes very weak or notices blood/bleeding in urine, stool, etc.   She verbalized understanding.   "I really want to see Dr. Sanda Klein because she is familiar with my long history of anemia".      Reason for Disposition . [1] Long-standing pallor AND [2] known diagnosis of anemia  Answer Assessment - Initial Assessment Questions 1. COLOR of SKIN: "What best describes the color of the skin?"     I slept all day yesterday.    I hiked a 2 mile trip over the weekend in [place].   I'm seeing spots right now.   I'm in [place] now and I don't feel right. 2. CHANGES: "Is this a change?" If so, ask: "How much of a change?"     Feeling weak.    Real weak.  I'm not [place].  I feel like this when I'm anemic.    I have had iron transfusions and blood transfusions for this.   I've been to the cancer center for these infusions.   They stopped the infusions for now to see if I would continue to drop my blood counts.   I'm for an colonoscopy and endoscopy is planned.    My mom is with is with me and is driving when we leave [employer].   3. PALLOR ELSEWHERE: "Has the color of the lips, gums or nailbeds also changed?     I'm not sure. 4. ONSET: When did the paleness begin?     I've been weak since I got up and dizzy.   Since I've been walking around [place] I've really noticed how weak I am and seeing spots before my eyes. 5. CAUSE: What do you think is causing the pale skin?     I'm anemic. 6. BLEEDING: "Any  recent blood loss?"     None in urine or stools.     Yesterday I had bright red blood on the toilet paper just the one time.   None today. 7. OTHER SYMPTOMS: "Do you have any other symptoms?" (e.g., weakness, dizziness, passing out, melena)     See above.    I'm seeing spots before my eyes now.   We are getting ready to leave.   My 33 yr old is with me too. 8. PREGNANCY: "Is there any chance you are pregnant?" "When was your last menstrual period?"     Could be a remote possibility  Protocols used: PALE SKIN-A-AH

## 2017-11-25 ENCOUNTER — Ambulatory Visit: Payer: Self-pay | Admitting: Family Medicine

## 2017-11-30 ENCOUNTER — Other Ambulatory Visit: Payer: Self-pay

## 2017-11-30 ENCOUNTER — Encounter: Payer: Self-pay | Admitting: *Deleted

## 2017-12-03 NOTE — Discharge Instructions (Signed)
General Anesthesia, Adult, Care After °These instructions provide you with information about caring for yourself after your procedure. Your health care provider may also give you more specific instructions. Your treatment has been planned according to current medical practices, but problems sometimes occur. Call your health care provider if you have any problems or questions after your procedure. °What can I expect after the procedure? °After the procedure, it is common to have: °· Vomiting. °· A sore throat. °· Mental slowness. ° °It is common to feel: °· Nauseous. °· Cold or shivery. °· Sleepy. °· Tired. °· Sore or achy, even in parts of your body where you did not have surgery. ° °Follow these instructions at home: °For at least 24 hours after the procedure: °· Do not: °? Participate in activities where you could fall or become injured. °? Drive. °? Use heavy machinery. °? Drink alcohol. °? Take sleeping pills or medicines that cause drowsiness. °? Make important decisions or sign legal documents. °? Take care of children on your own. °· Rest. °Eating and drinking °· If you vomit, drink water, juice, or soup when you can drink without vomiting. °· Drink enough fluid to keep your urine clear or pale yellow. °· Make sure you have little or no nausea before eating solid foods. °· Follow the diet recommended by your health care provider. °General instructions °· Have a responsible adult stay with you until you are awake and alert. °· Return to your normal activities as told by your health care provider. Ask your health care provider what activities are safe for you. °· Take over-the-counter and prescription medicines only as told by your health care provider. °· If you smoke, do not smoke without supervision. °· Keep all follow-up visits as told by your health care provider. This is important. °Contact a health care provider if: °· You continue to have nausea or vomiting at home, and medicines are not helpful. °· You  cannot drink fluids or start eating again. °· You cannot urinate after 8-12 hours. °· You develop a skin rash. °· You have fever. °· You have increasing redness at the site of your procedure. °Get help right away if: °· You have difficulty breathing. °· You have chest pain. °· You have unexpected bleeding. °· You feel that you are having a life-threatening or urgent problem. °This information is not intended to replace advice given to you by your health care provider. Make sure you discuss any questions you have with your health care provider. °Document Released: 07/07/2000 Document Revised: 09/03/2015 Document Reviewed: 03/15/2015 °Elsevier Interactive Patient Education © 2018 [employer] ° °

## 2017-12-16 ENCOUNTER — Other Ambulatory Visit: Payer: Self-pay

## 2017-12-16 ENCOUNTER — Encounter: Payer: Self-pay | Admitting: *Deleted

## 2017-12-21 ENCOUNTER — Ambulatory Visit
Admission: RE | Admit: 2017-12-21 | Payer: BC Managed Care – PPO | Source: Ambulatory Visit | Admitting: Gastroenterology

## 2017-12-21 HISTORY — DX: Gastro-esophageal reflux disease without esophagitis: K21.9

## 2017-12-21 SURGERY — ESOPHAGOGASTRODUODENOSCOPY (EGD) WITH PROPOFOL
Anesthesia: Choice

## 2018-01-11 ENCOUNTER — Other Ambulatory Visit: Payer: Self-pay

## 2018-01-11 ENCOUNTER — Emergency Department
Admission: EM | Admit: 2018-01-11 | Discharge: 2018-01-11 | Disposition: A | Discharge status: Home / Self Care | Payer: BC Managed Care – PPO | Attending: Emergency Medicine | Admitting: Emergency Medicine

## 2018-01-11 ENCOUNTER — Encounter: Payer: Self-pay | Admitting: Emergency Medicine

## 2018-01-11 DIAGNOSIS — Z79899 Other long term (current) drug therapy: Secondary | ICD-10-CM | POA: Insufficient documentation

## 2018-01-11 DIAGNOSIS — R51 Headache: Secondary | ICD-10-CM | POA: Insufficient documentation

## 2018-01-11 DIAGNOSIS — R519 Headache, unspecified: Secondary | ICD-10-CM

## 2018-01-11 DIAGNOSIS — H5712 Ocular pain, left eye: Secondary | ICD-10-CM | POA: Diagnosis present

## 2018-01-11 LAB — POCT PREGNANCY, URINE: PREG TEST UR: NEGATIVE

## 2018-01-11 MED ORDER — SODIUM CHLORIDE 0.9 % IV BOLUS
1000.0000 mL | Freq: Once | INTRAVENOUS | Status: AC
  Administered 2018-01-11: 1000 mL via INTRAVENOUS

## 2018-01-11 MED ORDER — PROCHLORPERAZINE EDISYLATE 10 MG/2ML IJ SOLN
10.0000 mg | Freq: Once | INTRAMUSCULAR | Status: AC
  Administered 2018-01-11: 10 mg via INTRAVENOUS
  Filled 2018-01-11: qty 2

## 2018-01-11 NOTE — ED Provider Notes (Signed)
[facility] Emergency Department Provider Note   ____________________________________________   I have reviewed the triage vital signs and the nursing notes.   HISTORY  Chief Complaint Pain behind her eye  History limited by: Not Limited   HPI Sherri Robertson is a 32 y.o. female who presents to the emergency department today because of concerns for pain located behind her left eye.  Patient states the pain is not in her eye but around it.  The patient states she has been seen for this in the past.  She states that occurs very rarely.  She is slightly more concerned because she had it happen to her last week and again this week which is more frequent than normal.  She denies any recent trauma to her head.  Denies any significant nausea or vomiting.  No fevers.  Per medical record review patient has a history of ER visit for similar symptoms roughly 2 years ago.   Past Medical History:  Diagnosis Date  . Anemia   . GERD (gastroesophageal reflux disease)     Patient Active Problem List   Diagnosis Date Noted  . Prediabetes 02/05/2017  . 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 Preventative health care 01/22/2017  . Thyroid nodule 01/22/2017  . Vitamin D deficiency 01/22/2017  . Acanthosis nigricans 08/05/2015  . Thyromegaly 08/05/2015  . Abnormal cervical Pap smear with positive HPV DNA test 01/26/2015  . Anxiety and depression 01/26/2015  . Dysmenorrhea 01/26/2015  . Low back pain 01/26/2015  . Fibroid 01/26/2015  . Other fatigue 01/26/2015  . Anemia, iron deficiency 09/21/2009  . Morbid obesity ([place]) 07/27/2009    Past Surgical History:  Procedure Laterality Date  . CESAREAN SECTION    . TONSILLECTOMY      Prior to Admission medications   Medication Sig Start Date End Date Taking? Authorizing Provider  acetaminophen (TYLENOL) 325 MG tablet Take 325 mg by mouth every 6 (six) hours as needed.    [provider]  bismuth subsalicylate (PEPTO  BISMOL) 262 MG/15ML suspension Take 30 mLs by mouth every 6 (six) hours as needed.    [provider]  [employer] GAS RELIEF 80 MG chewable tablet CHEW 1 TABLET (80 MG TOTAL) EVERY 6 (SIX) HOURS AS NEEDED BY MOUTH FOR FLATULENCE. 11/18/17   Earlie Server, MD  cyclobenzaprine (FLEXERIL) 5 MG tablet Take 1 tablet (5 mg total) by mouth every 8 (eight) hours as needed for muscle spasms. 07/28/17   Arnetha Courser, MD  IRON 100/C 100-250 MG TABS TAKE 1 TABLET BY MOUTH EVERY DAY 10/25/17   Karen Kitchens, NP  omeprazole (PRILOSEC) 20 MG capsule TAKE 1 CAPSULE BY MOUTH EVERY DAY 07/12/17   Karen Kitchens, NP  Vitamin D, Ergocalciferol, (DRISDOL) 50000 units CAPS capsule Take 1 capsule (50,000 Units total) by mouth every 7 (seven) days. 07/29/17   Arnetha Courser, MD    Allergies Patient has no known allergies.  Family History  Problem Relation Age of Onset  . Hypertension Mother   . Depression Mother   . Hyperlipidemia Mother   . Depression Sister   . Anxiety disorder Sister   . Anxiety disorder Brother   . Eczema Daughter   . COPD Maternal Grandmother   . Emphysema Maternal Grandmother   . Cancer Maternal Grandfather        stomach cancer    Social History Social History   Tobacco Use  . Smoking status: Never Smoker  . Smokeless tobacco: Never Used  Substance Use Topics  . Alcohol use: Yes    Comment: occassional - 3 glasses wine/month  . Drug use: No    Review of Systems Constitutional: No fever/chills Eyes: No visual changes. ENT: No sore throat. Cardiovascular: Denies chest pain. Respiratory: Denies shortness of breath. Gastrointestinal: No abdominal pain.  No nausea, no vomiting.  No diarrhea.   Genitourinary: Negative for dysuria. Musculoskeletal: Negative for back pain. Skin: Negative for rash. Neurological: Positive for pain behind her left eye   ____________________________________________   PHYSICAL EXAM:  VITAL SIGNS: ED Triage Vitals  Enc Vitals Group     BP  01/11/18 1731 119/87     Pulse Rate 01/11/18 1731 81     Resp 01/11/18 1731 18     Temp 01/11/18 1731 98.2 F (36.8 C)     Temp Source 01/11/18 1731 Oral     SpO2 01/11/18 1731 100 %     Weight 01/11/18 1732 280 lb (127 kg)     Height 01/11/18 1732 5\' 2"  (1.575 m)     Head Circumference --      Peak Flow --      Pain Score 01/11/18 1732 9   Constitutional: Alert and oriented.  Eyes: Conjunctivae are normal.  ENT      Head: Normocephalic and atraumatic.      Nose: No congestion/rhinnorhea.      Mouth/Throat: Mucous membranes are moist.      Neck: No stridor. Hematological/Lymphatic/Immunilogical: No cervical lymphadenopathy. Cardiovascular: Normal rate, regular rhythm.  No murmurs, rubs, or gallops.  Respiratory: Normal respiratory effort without tachypnea nor retractions. Breath sounds are clear and equal bilaterally. No wheezes/rales/rhonchi. Gastrointestinal: Soft and non tender. No rebound. No guarding.  Genitourinary: Deferred Musculoskeletal: Normal range of motion in all extremities. No lower extremity edema. Neurologic:  Normal speech and language. No gross focal neurologic deficits are appreciated.  Skin:  Skin is warm, dry and intact. No rash noted. Psychiatric: Mood and affect are normal. Speech and behavior are normal. Patient exhibits appropriate insight and judgment.  ____________________________________________    LABS (pertinent positives/negatives)  Upreg negative  ____________________________________________   EKG  None  ____________________________________________    RADIOLOGY  None  ____________________________________________   PROCEDURES  Procedures  ____________________________________________    INITIAL IMPRESSION / ASSESSMENT AND PLAN / ED COURSE  Pertinent labs & imaging results that were available during my care of the patient were reviewed by me and considered in my medical decision making (see chart for details).   Patient  presented to the emergency department today because of concerns for pain around her left eye.  Patient has had similar symptoms on and off throughout the years.  No concerning neurologic deficits.  Patient did feel better after IV medication and fluids.  At this point do not feel emergent neuroimaging is necessary.  Will give patient neurology follow-up information.   ____________________________________________   FINAL CLINICAL IMPRESSION(S) / ED DIAGNOSES  Final diagnoses:  Nonintractable headache, unspecified chronicity pattern, unspecified headache type     Note: This dictation was prepared with Dragon dictation. Any transcriptional errors that result from this process are unintentional     Nance Pear, MD 01/11/18 2155

## 2018-01-11 NOTE — ED Triage Notes (Signed)
Awoke at 1430 this afternoon with L periobital pain. Denies injury. Denies wearing contacts. Denies decreased vision. States pain is decreased with dark glasses. States has had two episodes in past year of the same and was seen but does not have diagnosis.

## 2018-01-11 NOTE — ED Notes (Signed)
Visual Acuity Left eye-20/25 Right eye-20/20 Both-20/20

## 2018-01-11 NOTE — Discharge Instructions (Addendum)
Please seek medical attention for any high fevers, chest pain, shortness of breath, change in behavior, persistent vomiting, bloody stool or any other new or concerning symptoms.  

## 2018-01-11 NOTE — ED Notes (Signed)
Pt states pressure/pain behind her left eye upon waking up today-works night shift-states the pain is not in her actual eye ball. Denies any head pain. Took 2 Tylenol and 800mg  ibuprofen around 1500 today with no relief.

## 2018-01-12 ENCOUNTER — Ambulatory Visit: Payer: BC Managed Care – PPO | Admitting: Nurse Practitioner

## 2018-01-12 ENCOUNTER — Encounter: Payer: Self-pay | Admitting: Nurse Practitioner

## 2018-01-12 VITALS — BP 104/84 | HR 96 | Temp 98.2°F | Resp 16 | Ht 62.0 in | Wt 281.5 lb

## 2018-01-12 DIAGNOSIS — H538 Other visual disturbances: Secondary | ICD-10-CM

## 2018-01-12 DIAGNOSIS — H5712 Ocular pain, left eye: Secondary | ICD-10-CM | POA: Diagnosis not present

## 2018-01-12 DIAGNOSIS — G4489 Other headache syndrome: Secondary | ICD-10-CM

## 2018-01-12 MED ORDER — BUTALBITAL-APAP-CAFFEINE 50-325-40 MG PO TABS: 1.0000 | ORAL_TABLET | Freq: Four times a day (QID) | ORAL | 0 refills | Status: DC | PRN

## 2018-01-12 NOTE — Progress Notes (Signed)
Name: Sherri Robertson   MRN: [id number]    DOB: 03/27/1986   Date:01/12/2018       Progress Note  Subjective  Chief Complaint  Chief Complaint  Patient presents with  . Headache    ER follow up    HPI  Patient presents for ER follow-up, seen at [facility] yesterday for posterior left eye pain. No concerning abnormalties noted, patient given IV fluids and compazine with relief of symptoms.   Patient endorses left eye pain, states first episode started last Tuesday lasted one day and blurry vision on right eye.  States yesterday work up and felt pressure on left eye throbbing behind eye. 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 Has light-sensitivity, sometimes has blurry vision on the opposite eye. Denies nausea. Has tried percoccet, ibuprofen and muscle relaxers with minimal help. Sleep helps the best. These episodes are usually about a day, states only gets it about once or twice a year. Pain is not worse with eye movements, no eye drainage, no floaters or flashes of light in vision.   Patient Active Problem List   Diagnosis Date Noted  . Prediabetes 02/05/2017  . 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 Preventative health care 01/22/2017  . Thyroid nodule 01/22/2017  . Vitamin D deficiency 01/22/2017  . Acanthosis nigricans 08/05/2015  . Thyromegaly 08/05/2015  . Abnormal cervical Pap smear with positive HPV DNA test 01/26/2015  . Anxiety and depression 01/26/2015  . Dysmenorrhea 01/26/2015  . Low back pain 01/26/2015  . Fibroid 01/26/2015  . Other fatigue 01/26/2015  . Anemia, iron deficiency 09/21/2009  . Morbid obesity ([place]) 07/27/2009    Past Medical History:  Diagnosis Date  . Anemia   . GERD (gastroesophageal reflux disease)     Past Surgical History:  Procedure Laterality Date  . CESAREAN SECTION    . TONSILLECTOMY      Social History   Tobacco Use  . Smoking status: Never Smoker  . Smokeless tobacco: Never Used  Substance Use Topics  . Alcohol use: Yes    Comment:  occassional - 3 glasses wine/month     Current Outpatient Medications:  .  acetaminophen (TYLENOL) 325 MG tablet, Take 325 mg by mouth every 6 (six) hours as needed., Disp: , Rfl:  .  bismuth subsalicylate (PEPTO BISMOL) 262 MG/15ML suspension, Take 30 mLs by mouth every 6 (six) hours as needed., Disp: , Rfl:  .  [employer] GAS RELIEF 80 MG chewable tablet, CHEW 1 TABLET (80 MG TOTAL) EVERY 6 (SIX) HOURS AS NEEDED BY MOUTH FOR FLATULENCE., Disp: 30 tablet, Rfl: 0 .  cyclobenzaprine (FLEXERIL) 5 MG tablet, Take 1 tablet (5 mg total) by mouth every 8 (eight) hours as needed for muscle spasms., Disp: 30 tablet, Rfl: 0 .  IRON 100/C 100-250 MG TABS, TAKE 1 TABLET BY MOUTH EVERY DAY, Disp: 90 each, Rfl: 1 .  omeprazole (PRILOSEC) 20 MG capsule, TAKE 1 CAPSULE BY MOUTH EVERY DAY, Disp: 30 capsule, Rfl: 3 .  Vitamin D, Ergocalciferol, (DRISDOL) 50000 units CAPS capsule, Take 1 capsule (50,000 Units total) by mouth every 7 (seven) days. (Patient not taking: Reported on 01/12/2018), Disp: 12 capsule, Rfl: 0  No Known Allergies  ROS    No other specific complaints in a complete review of systems (except as listed in HPI above).  Objective  Vitals:   01/12/18 1346  BP: 104/84  Pulse: 96  Resp: 16  Temp: 98.2 F (36.8 C)  TempSrc: Oral  SpO2: 98%  Weight: 281 lb  8 oz (127.7 kg)  Height: 5\' 2"  (1.575 m)    Body mass index is 51.49 kg/m.  Nursing Note and Vital Signs reviewed.  Physical Exam  Constitutional: She is oriented to person, place, and time. She appears well-developed and well-nourished. She does not appear ill.  Wearing sunglasses   HENT:  Nose: Nose normal. Right sinus exhibits no maxillary sinus tenderness and no frontal sinus tenderness. Left sinus exhibits no maxillary sinus tenderness and no frontal sinus tenderness.  Eyes: Pupils are equal, round, and reactive to light. Conjunctivae and EOM are normal. Right eye exhibits no nystagmus. Left eye exhibits no nystagmus.  Neck:  Normal range of motion. Neck supple.  Cardiovascular: Normal rate and normal heart sounds.  Pulmonary/Chest: Effort normal and breath sounds normal.  Neurological: She is alert and oriented to person, place, and time. She has normal strength. No cranial nerve deficit or sensory deficit. Coordination and gait normal.  Skin: Skin is warm and dry. No erythema.  Psychiatric: She has a normal mood and affect. Her behavior is normal.     Results for orders placed or performed during the hospital encounter of 01/11/18 (from the past 48 hour(s))  Pregnancy, urine POC     Status: None   Collection Time: 01/11/18  5:38 PM  Result Value Ref Range   Preg Test, Ur NEGATIVE NEGATIVE    Comment:        THE SENSITIVITY OF THIS METHODOLOGY IS >24 mIU/mL     Assessment & Plan   1. Other headache syndrome Discussed ER precautions.  - AMB referral to headache clinic - butalbital-acetaminophen-caffeine (FIORICET, ESGIC) 50-325-40 MG tablet; Take 1-2 tablets by mouth every 6 (six) hours as needed for headache.  Dispense: 10 tablet; Refill: 0  2. Pain of left eye - Ambulatory referral to Ophthalmology  3. Blurry vision - Ambulatory referral to Ophthalmology

## 2018-01-12 NOTE — Patient Instructions (Addendum)
-   See eye doctor for eye pain and blurry vision.  - Referral placed for headache clinic. - Take Fioricet; can take every 4 hours if needed (no more than 6 in 24 hours)  - Drink plenty of water ( 64 ounces daily)  - Some options for relief without drugs can include resting your eyes, removing yourself from bright sunlight or other harsh lighting, and taking a break from looking at a screen. As with all types of migraine, try to avoid triggers like stress, dehydration, high altitude, low blood sugar, excessive heat and extensive time spent staring at a screen.

## 2018-01-14 ENCOUNTER — Ambulatory Visit: Payer: BC Managed Care – PPO | Admitting: Family Medicine

## 2018-01-14 ENCOUNTER — Encounter: Payer: Self-pay | Admitting: Emergency Medicine

## 2018-01-14 ENCOUNTER — Other Ambulatory Visit: Payer: Self-pay | Admitting: Ophthalmology

## 2018-01-14 ENCOUNTER — Encounter: Payer: Self-pay | Admitting: Family Medicine

## 2018-01-14 VITALS — BP 120/84 | HR 84 | Temp 98.5°F | Resp 16 | Ht 62.0 in | Wt 282.1 lb

## 2018-01-14 DIAGNOSIS — J069 Acute upper respiratory infection, unspecified: Secondary | ICD-10-CM

## 2018-01-14 DIAGNOSIS — H4710 Unspecified papilledema: Secondary | ICD-10-CM

## 2018-01-14 MED ORDER — MAGIC MOUTHWASH W/LIDOCAINE: 5.0000 mL | Freq: Four times a day (QID) | ORAL | 0 refills | Status: DC | PRN

## 2018-01-14 MED ORDER — GUAIFENESIN ER 600 MG PO TB12: 600.0000 mg | ORAL_TABLET | Freq: Two times a day (BID) | ORAL | 0 refills | Status: DC | PRN

## 2018-01-14 MED ORDER — IBUPROFEN 600 MG PO TABS: 600.0000 mg | ORAL_TABLET | Freq: Three times a day (TID) | ORAL | 0 refills | Status: DC | PRN

## 2018-01-14 MED ORDER — FLUTICASONE PROPIONATE 50 MCG/ACT NA SUSP: 2.0000 | Freq: Every day | NASAL | 0 refills | Status: DC

## 2018-01-14 MED ORDER — BENZONATATE 100 MG PO CAPS: 100.0000 mg | ORAL_CAPSULE | Freq: Three times a day (TID) | ORAL | 0 refills | Status: DC | PRN

## 2018-01-14 NOTE — Patient Instructions (Signed)
Upper Respiratory Infection, Adult Most upper respiratory infections (URIs) are caused by a virus. A URI affects the nose, throat, and upper air passages. The most common type of URI is often called "the common cold." Follow these instructions at home:  Take medicines only as told by your doctor.  Gargle warm saltwater or take cough drops to comfort your throat as told by your doctor.  Use a warm mist humidifier or inhale steam from a shower to increase air moisture. This may make it easier to breathe.  Drink enough fluid to keep your pee (urine) clear or pale yellow.  Eat soups and other clear broths.  Have a healthy diet.  Rest as needed.  Go back to work when your fever is gone or your doctor says it is okay. ? You may need to stay home longer to avoid giving your URI to others. ? You can also wear a face mask and wash your hands often to prevent spread of the virus.  Use your inhaler more if you have asthma.  Do not use any tobacco products, including cigarettes, chewing tobacco, or electronic cigarettes. If you need help quitting, ask your doctor. Contact a doctor if:  You are getting worse, not better.  Your symptoms are not helped by medicine.  You have chills.  You are getting more short of breath.  You have brown or red mucus.  You have yellow or brown discharge from your nose.  You have pain in your face, especially when you bend forward.  You have a fever.  You have puffy (swollen) neck glands.  You have pain while swallowing.  You have white areas in the back of your throat. Get help right away if:  You have very bad or constant: ? Headache. ? Ear pain. ? Pain in your forehead, behind your eyes, and over your cheekbones (sinus pain). ? Chest pain.  You have long-lasting (chronic) lung disease and any of the following: ? Wheezing. ? Long-lasting cough. ? Coughing up blood. ? A change in your usual mucus.  You have a stiff neck.  You have  changes in your: ? Vision. ? Hearing. ? Thinking. ? Mood. This information is not intended to replace advice given to you by your health care provider. Make sure you discuss any questions you have with your health care provider. Document Released: 09/17/2007 Document Revised: 12/02/2015 Document Reviewed: 07/06/2013 Elsevier Interactive Patient Education  2018 Elsevier Inc.  

## 2018-01-14 NOTE — Progress Notes (Signed)
Name: Sherri Robertson   MRN: [id number]    DOB: [birth year]   Date:01/14/2018       Progress Note  Subjective  Chief Complaint  Chief Complaint  Patient presents with  . Cough    left nodule swollen , throat pain    HPI  Pt presents with concern for new onset illness that started this morning.  Symptoms include sore throat, cough, LEFT sided lymph nodes are swollen in her neck, mucus in her throat, some mild nausea, some left ear discomfort.Denies fevers or chills, chest pain, shortness of breath, V/D, abdominal pain. Daughter has walking pneumonia.   Patient Active Problem List   Diagnosis Date Noted  . Prediabetes 02/05/2017  . 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 Preventative health care 01/22/2017  . Thyroid nodule 01/22/2017  . Vitamin D deficiency 01/22/2017  . Acanthosis nigricans 08/05/2015  . Thyromegaly 08/05/2015  . Abnormal cervical Pap smear with positive HPV DNA test 01/26/2015  . Anxiety and depression 01/26/2015  . Dysmenorrhea 01/26/2015  . Low back pain 01/26/2015  . Fibroid 01/26/2015  . Other fatigue 01/26/2015  . Anemia, iron deficiency 09/21/2009  . Morbid obesity ([place]) 07/27/2009    Social History   Tobacco Use  . Smoking status: Never Smoker  . Smokeless tobacco: Never Used  Substance Use Topics  . Alcohol use: Yes    Comment: occassional - 3 glasses wine/month     Current Outpatient Medications:  .  acetaminophen (TYLENOL) 325 MG tablet, Take 325 mg by mouth every 6 (six) hours as needed., Disp: , Rfl:  .  butalbital-acetaminophen-caffeine (FIORICET, ESGIC) 50-325-40 MG tablet, Take 1-2 tablets by mouth every 6 (six) hours as needed for headache., Disp: 10 tablet, Rfl: 0 .  omeprazole (PRILOSEC) 20 MG capsule, TAKE 1 CAPSULE BY MOUTH EVERY DAY, Disp: 30 capsule, Rfl: 3 .  bismuth subsalicylate (PEPTO BISMOL) 262 MG/15ML suspension, Take 30 mLs by mouth every 6 (six) hours as needed., Disp: , Rfl:  .  [employer]S GAS RELIEF 80 MG chewable tablet, CHEW 1  TABLET (80 MG TOTAL) EVERY 6 (SIX) HOURS AS NEEDED BY MOUTH FOR FLATULENCE. (Patient not taking: Reported on 01/14/2018), Disp: 30 tablet, Rfl: 0 .  cyclobenzaprine (FLEXERIL) 5 MG tablet, Take 1 tablet (5 mg total) by mouth every 8 (eight) hours as needed for muscle spasms. (Patient not taking: Reported on 01/14/2018), Disp: 30 tablet, Rfl: 0 .  IRON 100/C 100-250 MG TABS, TAKE 1 TABLET BY MOUTH EVERY DAY (Patient not taking: Reported on 01/14/2018), Disp: 90 each, Rfl: 1 .  Vitamin D, Ergocalciferol, (DRISDOL) 50000 units CAPS capsule, Take 1 capsule (50,000 Units total) by mouth every 7 (seven) days. (Patient not taking: Reported on 01/12/2018), Disp: 12 capsule, Rfl: 0  No Known Allergies  I personally reviewed active problem list, medication list, allergies with the patient/caregiver today.  ROS  Ten systems reviewed and is negative except as mentioned in HPI  Objective  Vitals:   01/14/18 1118  BP: 120/84  Pulse: 84  Resp: 16  Temp: 98.5 F (36.9 C)  TempSrc: Oral  SpO2: 94%  Weight: 282 lb 1.6 oz (128 kg)  Height: 5\' 2"  (1.575 m)   Body mass index is 51.6 kg/m.  Nursing Note and Vital Signs reviewed.  Physical Exam  Constitutional: Patient appears well-developed and well-nourished. Obese. No distress.  HEENT: head atraumatic, normocephalic, pupils equal and reactive to light, Bilateral TM's without erythema or effusion,  bilateral maxillary and frontal sinuses are non-tender, neck  supple with some left sided lymphadenopathy, throat within normal limits - no erythema or exudate, s/p tonsillectomy. Cardiovascular: Normal rate, regular rhythm and normal heart sounds.  No murmur heard. No BLE edema. Pulmonary/Chest: Effort normal and breath sounds clear bilaterally. No respiratory distress. Psychiatric: Patient has a normal mood and affect. behavior is normal. Judgment and thought content normal.  Results for orders placed or performed during the hospital encounter of 01/11/18  (from the past 72 hour(s))  Pregnancy, urine POC     Status: None   Collection Time: 01/11/18  5:38 PM  Result Value Ref Range   Preg Test, Ur NEGATIVE NEGATIVE    Comment:        THE SENSITIVITY OF THIS METHODOLOGY IS >24 mIU/mL     Assessment & Plan  1. Upper respiratory tract infection, unspecified type - benzonatate (TESSALON PERLES) 100 MG capsule; Take 1 capsule (100 mg total) by mouth 3 (three) times daily as needed.  Dispense: 20 capsule; Refill: 0 - guaiFENesin (MUCINEX) 600 MG 12 hr tablet; Take 1 tablet (600 mg total) by mouth 2 (two) times daily as needed for cough or to loosen phlegm.  Dispense: 30 tablet; Refill: 0 - ibuprofen (ADVIL,MOTRIN) 600 MG tablet; Take 1 tablet (600 mg total) by mouth every 8 (eight) hours as needed.  Dispense: 30 tablet; Refill: 0 - fluticasone (FLONASE) 50 MCG/ACT nasal spray; Place 2 sprays into both nostrils daily.  Dispense: 16 g; Refill: 0 - magic mouthwash w/lidocaine SOLN; Take 5 mLs by mouth 4 (four) times daily as needed for mouth pain.  Dispense: 100 mL; Refill: 0 - Drink plenty of fluid, use humidifier at night, get plenty of rest.  -Red flags and when to present for emergency care or RTC including fever >101.64F, chest pain, shortness of breath, new/worsening/un-resolving symptoms, reviewed with patient at time of visit. Follow up and care instructions discussed and provided in AVS.

## 2018-01-15 ENCOUNTER — Telehealth: Payer: Self-pay | Admitting: Family Medicine

## 2018-01-15 NOTE — Telephone Encounter (Signed)
Copied from [place] [id number]. Topic: Referral - Request >> Jan 15, 2018 10:05 AM Conception Chancy, NT wrote: Reason for CRM: Sherri Robertson is calling from [facility] and states they received the referral with the demographics but are requesting any office notes, labs, EKG, MRI, or CT scan to be faced to her.   Fax# [phone] Cb# [phone]

## 2018-01-18 NOTE — Telephone Encounter (Signed)
SENT to [facility] Referral

## 2018-01-19 NOTE — Telephone Encounter (Signed)
Notes were faxed over to [facility] at the [place] at 5:31pm on 01/19/18 to fax # [phone]

## 2018-01-19 NOTE — Telephone Encounter (Signed)
Sherri Robertson calling back to check status-still requesting notes. Please advise.

## 2018-01-21 ENCOUNTER — Ambulatory Visit: Payer: BC Managed Care – PPO

## 2018-02-05 ENCOUNTER — Other Ambulatory Visit: Payer: Self-pay | Admitting: Family Medicine

## 2018-02-05 DIAGNOSIS — J069 Acute upper respiratory infection, unspecified: Secondary | ICD-10-CM

## 2018-02-05 NOTE — Telephone Encounter (Signed)
This was for acute illness only.

## 2018-02-24 ENCOUNTER — Other Ambulatory Visit: Payer: Self-pay | Admitting: Family Medicine

## 2018-02-24 DIAGNOSIS — J069 Acute upper respiratory infection, unspecified: Secondary | ICD-10-CM

## 2018-04-21 ENCOUNTER — Encounter: Payer: BC Managed Care – PPO | Admitting: Obstetrics and Gynecology

## 2018-05-05 ENCOUNTER — Encounter: Payer: BC Managed Care – PPO | Admitting: Obstetrics and Gynecology

## 2018-08-13 ENCOUNTER — Telehealth: Payer: Self-pay

## 2018-08-13 NOTE — Telephone Encounter (Signed)
At this time our practice policy is to write a letter that state you have a high risk condition if you are willing to share it we can add the diagnoisis for your employer. Please practice social distancing and frequent hand hygiene as wearing a mask.   Can write note if patient wants can have via mychart or fax    To whom it may concern,   Sherri Robertson has a medical condition  that is considered a high-risk diagnosis for severe illness from COVID-19.  Sincerely,  [facility]

## 2018-08-13 NOTE — Telephone Encounter (Signed)
Copied from [place] [id number]. Topic: General - Other >> Aug 12, 2018  2:29 PM Celene Kras A wrote: Reason for CRM: Pt called requesting a letter for work stating she is at risk to take care of COVID-19 pts due to her BMI. Pt would like the letter sent to her email, [email or web address], if possible. Please advise.

## 2018-11-12 ENCOUNTER — Encounter: Payer: Self-pay | Admitting: Nurse Practitioner

## 2018-11-12 ENCOUNTER — Encounter: Payer: Self-pay | Admitting: Family Medicine

## 2018-11-12 ENCOUNTER — Other Ambulatory Visit: Payer: Self-pay

## 2018-11-12 ENCOUNTER — Ambulatory Visit (INDEPENDENT_AMBULATORY_CARE_PROVIDER_SITE_OTHER): Payer: BC Managed Care – PPO | Admitting: Nurse Practitioner

## 2018-11-12 DIAGNOSIS — G4489 Other headache syndrome: Secondary | ICD-10-CM | POA: Diagnosis not present

## 2018-11-12 DIAGNOSIS — G43019 Migraine without aura, intractable, without status migrainosus: Secondary | ICD-10-CM

## 2018-11-12 DIAGNOSIS — R7303 Prediabetes: Secondary | ICD-10-CM

## 2018-11-12 MED ORDER — BUTALBITAL-APAP-CAFFEINE 50-325-40 MG PO TABS: 1.0000 | ORAL_TABLET | Freq: Four times a day (QID) | ORAL | 0 refills | Status: DC | PRN

## 2018-11-12 NOTE — Patient Instructions (Addendum)
Headaches: - referral to neurology, if you do not hear from them within a month please let us know.  - https://americanmigrainefoundation.org/resource-library/top-10-migraine-triggers/ - Keep headache diary What is a Headache Diary?  A daily headache diary is one of the most important tools your treatment team has to help you. An accurate headache diary serves to:  Monitor the frequency, duration and severity of your headaches over time  Identify patterns that may help determine triggers and improve treatment  Track medication use and response  Maintain long term records of what has worked and what has not Together with a headache trigger tracker, a diary can help [place] plan your treatment and manage your headaches. Learn more about headache treatments. How to Keep a Headache Diary There are a number of options for keeping a diary. No matter which method you choose, it is important to maintain the diary as carefully as possible. Learn more about diagnosing headaches.   iPhone, Android, and [employer]  Migraine Diary Headache Relief Diary Computer-based programs Migraine Diary Printed diaries  National Headache Foundation  American Headache Society  WebMD Excel spreadsheet  Simple calendars _____ Diabetes  Foods and drinks to limit include  . fried foods and other foods high in saturated fat and trans fat  . foods high in salt, also called sodium  . sweets, such as baked goods, candy, and ice cream  . beverages with added sugars, such as juice, regular soda, and regular sports or energy drinks  Drink water instead of sweetened beverages. Consider using a sugar substitute in your coffee or tea.   Instead, eat carbohydrates from fruit, vegetables, whole grains, beans, and low-fat or nonfat milk. Choose healthy carbohydrates, such as fruit, vegetables, whole grains, beans, and low-fat milk, as part of your prediabetes meal plan.

## 2018-11-12 NOTE — Progress Notes (Addendum)
Virtual Visit via Video Note  I connected with Sherri Robertson on 11/12/18 at  2:40 PM EDT by a video enabled telemedicine application and verified that I am speaking with the correct person using two identifiers.   Staff discussed the limitations of evaluation and management by telemedicine and the availability of in person appointments. The patient expressed understanding and agreed to proceed.  Patient location: home  My location: work office Other people present:  none HPI   Patient endorses headaches that has been very frequent over the last month. She took 800mg  of ibuprofen and 1,000mg  of acetaminophen this morning with relief of frontal headache. Headache is bilateral frontal headache with left posterior eye pain. No nausea, vomiting, blurry vision. Endorses photophobia and phonophobia. Has been diagnosed with occular migraine in the ER. Has not seen neurologist. States she had has dealt with headaches like this for over 5 years, maybe has about 6 in a year. States it has become more frequent, intensity is the same. Typically last 1-2 days. States took Fioricet with some relief but states it makes drowsy.    Prediabetes Had A1C of 6.5% back in 2017, last one was 6.3%. She has not been on medication for this and was offered metformin by PCP last year and declined. Denies polydipsia, polyuria and polyphagia.  Lab Results  Component Value Date   HGBA1C 6.3 (H) 07/28/2017   24 hour diet recall  Supper: chicfila; chicken sandwich and fries Lunch: Buffalo chicken dip and apple juice     PHQ2/9: Depression screen [facility] 2/9 11/12/2018 01/14/2018 07/28/2017 01/22/2017 08/01/2015  Decreased Interest 0 0 0 0 0  Down, Depressed, Hopeless 0 0 0 0 0  PHQ - 2 Score 0 0 0 0 0  Altered sleeping 0 0 - - -  Tired, decreased energy 0 0 - - -  Change in appetite 0 0 - - -  Feeling bad or failure about yourself  0 0 - - -  Trouble concentrating 0 0 - - -  Moving slowly or fidgety/restless 0 0 - - -   Suicidal thoughts 0 0 - - -  PHQ-9 Score 0 - - - -  Difficult doing work/chores Not difficult at all Not difficult at all - - -    PHQ reviewed. Negative  Patient Active Problem List   Diagnosis Date Noted  . Prediabetes 02/05/2017  . 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 Preventative health care 01/22/2017  . Thyroid nodule 01/22/2017  . Vitamin D deficiency 01/22/2017  . Acanthosis nigricans 08/05/2015  . Thyromegaly 08/05/2015  . Abnormal cervical Pap smear with positive HPV DNA test 01/26/2015  . Anxiety and depression 01/26/2015  . Dysmenorrhea 01/26/2015  . Low back pain 01/26/2015  . Fibroid 01/26/2015  . Other fatigue 01/26/2015  . Anemia, iron deficiency 09/21/2009  . Morbid obesity ([place]) 07/27/2009    Past Medical History:  Diagnosis Date  . Anemia   . GERD (gastroesophageal reflux disease)     Past Surgical History:  Procedure Laterality Date  . CESAREAN SECTION    . TONSILLECTOMY      Social History   Tobacco Use  . Smoking status: Never Smoker  . Smokeless tobacco: Never Used  Substance Use Topics  . Alcohol use: Yes    Comment: occassional - 3 glasses wine/month     Current Outpatient Medications:  .  acetaminophen (TYLENOL) 325 MG tablet, Take 325 mg by mouth every 6 (six) hours as needed., Disp: , Rfl:  .  bismuth subsalicylate (PEPTO BISMOL) 262 MG/15ML suspension, Take 30 mLs by mouth every 6 (six) hours as needed., Disp: , Rfl:  .  butalbital-acetaminophen-caffeine (FIORICET, ESGIC) 50-325-40 MG tablet, Take 1-2 tablets by mouth every 6 (six) hours as needed for headache., Disp: 10 tablet, Rfl: 0 .  ibuprofen (ADVIL,MOTRIN) 600 MG tablet, Take 1 tablet (600 mg total) by mouth every 8 (eight) hours as needed., Disp: 30 tablet, Rfl: 0 .  omeprazole (PRILOSEC) 20 MG capsule, TAKE 1 CAPSULE BY MOUTH EVERY DAY, Disp: 30 capsule, Rfl: 3 .  benzonatate (TESSALON PERLES) 100 MG capsule, Take 1 capsule (100 mg total) by mouth 3 (three) times daily as needed.  (Patient not taking: Reported on 11/12/2018), Disp: 20 capsule, Rfl: 0 .  fluticasone (FLONASE) 50 MCG/ACT nasal spray, Place 2 sprays into both nostrils daily. (Patient not taking: Reported on 11/12/2018), Disp: 16 g, Rfl: 0 .  guaiFENesin (MUCINEX) 600 MG 12 hr tablet, Take 1 tablet (600 mg total) by mouth 2 (two) times daily as needed for cough or to loosen phlegm. (Patient not taking: Reported on 11/12/2018), Disp: 30 tablet, Rfl: 0 .  magic mouthwash w/lidocaine SOLN, Take 5 mLs by mouth 4 (four) times daily as needed for mouth pain. (Patient not taking: Reported on 11/12/2018), Disp: 100 mL, Rfl: 0  No Known Allergies  ROS   No other specific complaints in a complete review of systems (except as listed in HPI above).  Objective  There were no vitals filed for this visit.   There is no height or weight on file to calculate BMI.  Nursing Note and Vital Signs reviewed.  Physical Exam   Constitutional: Patient appears well-developed and well-nourished. No distress.  HENT: Head: Normocephalic and atraumatic. Pulmonary/Chest: Effort normal  Neurological: alert and oriented, speech normal.  Psychiatric: Patient has a normal mood and affect. behavior is normal. Judgment and thought content normal.    Assessment & Plan  1. Intractable migraine without aura and without status migrainosus Keep headache journal, discussed migraine diet due to bilateral painnot exactly sure if this is a migraine will refer to neurology for proper diagnoses and treatment - Ambulatory referral to Neurology  2. Morbid obesity ([place]) Discussed healthy eating also discussed migraine diet  3. Prediabetes Discussed diet, will follow-up in 1 month for labs  4. Other headache syndrome - butalbital-acetaminophen-caffeine (FIORICET) 50-325-40 MG tablet; Take 1-2 tablets by mouth every 6 (six) hours as needed for headache.  Dispense: 10 tablet; Refill: 0 - Ambulatory referral to Neurology    Follow Up  Instructions:    I discussed the assessment and treatment plan with the patient. The patient was provided an opportunity to ask questions and all were answered. The patient agreed with the plan and demonstrated an understanding of the instructions.   The patient was advised to call back or seek an in-person evaluation if the symptoms worsen or if the condition fails to improve as anticipated.  I provided 21 minutes of non-face-to-face time during this encounter.   Fredderick Severance, NP

## 2018-11-28 NOTE — Progress Notes (Signed)
Virtual Visit via Video Note The purpose of this virtual visit is to provide medical care while limiting exposure to the novel coronavirus.    Consent was obtained for video visit:  Yes Answered questions that patient had about telehealth interaction:  Yes I discussed the limitations, risks, security and privacy concerns of performing an evaluation and management service by telemedicine. I also discussed with the patient that there may be a patient responsible charge related to this service. The patient expressed understanding and agreed to proceed.  Pt location: Home Physician Location: Home Name of referring provider:  Fredderick Severance, NP I connected with Sherri Robertson at patients initiation/request on 11/29/2018 at  7:50 AM EDT by video enabled telemedicine application and verified that I am speaking with the correct person using two identifiers. Pt MRN:  [id number] Pt DOB:  Jul 13, 1985 Video Participants:  Sherri Robertson   History of Present Illness:  Sherri Robertson is a 33 year old female who presents for headaches.  History supplemented by referring provider note.  Onset:  55 or 33 years old.  She would need to sleep it off in dark room with something over her eyes.  They would occur every 3 to 4 months.  However, they have getting more frequent in 2019.  She went to the ED twice last year.   Location:  Behind the eyes radiating to back of head (may be either side) Quality:  Pressure, pounding Intensity:  8/10.  She denies new headache, thunderclap headache Aura:  no Premonitory Phase:  no Postdrome:  fatigue Associated symptoms:  Photophobia, phonophobia, occasional nausea.  She denies associated visual disturbance, unilateral numbness or weakness. Duration:  1 to 1 1/2 days.  Normally occur when she gets up from sleep.  Rarely occurs at work. Frequency:  Twice a month. Frequency of abortive medication:  Triggers:  Possibly dairy (cheese and milk) Relieving factors:   Something cool over her face Activity:  aggravates  She has been to the ED a handful of times over the years due to headache.    CT head from 07/22/13 and 02/20/16 were personally reviewed and were normal.  Rescue protocol:  First line ibuprofen or Tylenol, second line Fioricet Current NSAIDS:  ibuprofen Current analgesics:  Fioricet (drowsiness), acetaminophen Current triptans:  none Current ergotamine:  none Current anti-emetic:  none Current muscle relaxants:  none Current anti-anxiolytic:  none Current sleep aide:  none Current Antihypertensive medications:  none Current Antidepressant medications:  none Current Anticonvulsant medications:  none Current anti-CGRP:  none Current Vitamins/Herbal/Supplements:  none Current Antihistamines/Decongestants:  none Other therapy:  none Hormone/birth control:  none  Past NSAIDS:  naproxen Past analgesics:  none Past abortive triptans:  none Past abortive ergotamine:  none Past muscle relaxants:  cyclobenzaprine Past anti-emetic:  Reglan Past antihypertensive medications:  none Past antidepressant medications:  none Past anticonvulsant medications:  none Past anti-CGRP:  none Past vitamins/Herbal/Supplements:  none Past antihistamines/decongestants:  none Other past therapies:  none  Caffeine:  1 cup of coffee 2 to 3 times a week, no soda Diet:  Started eliminating cheese and milk.  Hydrates. Exercise:  Not routine Depression:  no; Anxiety:  stable Other pain:  no Sleep hygiene:  Good. 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 Anemia   . GERD (gastroesophageal reflux disease)  Medications: Outpatient Encounter Medications as of 11/29/2018  Medication Sig  . acetaminophen (TYLENOL) 325 MG tablet Take 325 mg by mouth every 6 (six) hours as needed.  . bismuth subsalicylate (PEPTO BISMOL) 262 MG/15ML  suspension Take 30 mLs by mouth every 6 (six) hours as needed.  . butalbital-acetaminophen-caffeine (FIORICET) 50-325-40 MG tablet Take 1-2 tablets by mouth every 6 (six) hours as needed for headache.  . ibuprofen (ADVIL,MOTRIN) 600 MG tablet Take 1 tablet (600 mg total) by mouth every 8 (eight) hours as needed.  Marland Kitchen omeprazole (PRILOSEC) 20 MG capsule TAKE 1 CAPSULE BY MOUTH EVERY DAY   No facility-administered encounter medications on file as of 11/29/2018.     Allergies: No Known Allergies  Family History: Family History  Problem Relation Age of Onset  . Hypertension Mother   . Depression Mother   . Hyperlipidemia Mother   . Depression Sister   . Anxiety disorder Sister   . Anxiety disorder Brother   . Eczema Daughter   . COPD Maternal Grandmother   . Emphysema Maternal Grandmother   . Cancer Maternal Grandfather        stomach cancer    Social History: Social History   Socioeconomic History  . Marital status: Single    Spouse name: Not on file  . Number of children: Not on file  . Years of education: Not on file  . Highest education level: Not on file  Occupational History  . Not on file  Social Needs  . Financial resource strain: Not on file  . Food insecurity    Worry: Not on file    Inability: Not on file  . Transportation needs    Medical: Not on file    Non-medical: Not on file  Tobacco Use  . Smoking status: Never Smoker  . Smokeless tobacco: Never Used  Substance and Sexual Activity  . Alcohol use: Yes    Comment: occassional - 3 glasses wine/month  . Drug use: No  . Sexual activity: Yes    Birth control/protection: None  Lifestyle  . Physical activity    Days per week: Not on file    Minutes per session: Not on file  . Stress: Not on file  Relationships  . Social Herbalist on phone: Not on file    Gets together: Not on file    Attends religious service: Not on file    Active member of club or organization: Not on file    Attends  meetings of clubs or organizations: Not on file    Relationship status: Not on file  . Intimate partner violence    Fear of current or ex partner: Not on file    Emotionally abused: Not on file    Physically abused: Not on file    Forced sexual activity: Not on file  Other Topics Concern  . Not on file  Social History Narrative  . Not on file    Observations/Objective:   Temperature 98 F (36.7 C), height 5\' 1"  (1.549 m), weight 230 lb (104.3 kg), last menstrual period 11/03/2018. No acute distress.  Alert and oriented.  Speech fluent and not dysarthric.  Language intact.  Eyes orthophoric on primary gaze.  Face symmetric.  Assessment and Plan:   Migraine without aura, without status migrainosus, not intractable  1.  For preventative management, start topiramate 25mg  at bedtime for one week, then increase to 50mg  at bedtime.  If not improved in 5 weeks, we can increase to  75mg  at bedtime. 2.  For abortive therapy, rizatriptan 10mg .  STOP FIORICET AND OTCs 3.  Limit use of pain relievers to no more than 2 days out of week to prevent risk of rebound or medication-overuse headache. 4.  Keep headache diary 5.  Exercise, hydration, caffeine cessation, sleep hygiene, monitor for and avoid triggers 6.  Consider:  magnesium citrate 400mg  daily, riboflavin 400mg  daily, and coenzyme Q10 100mg  three times daily 7. Always keep in mind that currently taking a hormone or birth control may be a possible trigger or aggravating factor for migraine. 8. Follow up 4 months   Follow Up Instructions:    -I discussed the assessment and treatment plan with the patient. The patient was provided an opportunity to ask questions and all were answered. The patient agreed with the plan and demonstrated an understanding of the instructions.   The patient was advised to call back or seek an in-person evaluation if the symptoms worsen or if the condition fails to improve as anticipated.   Dudley Major, DO

## 2018-11-29 ENCOUNTER — Other Ambulatory Visit: Payer: Self-pay

## 2018-11-29 ENCOUNTER — Encounter: Payer: Self-pay | Admitting: Neurology

## 2018-11-29 ENCOUNTER — Telehealth (INDEPENDENT_AMBULATORY_CARE_PROVIDER_SITE_OTHER): Payer: BC Managed Care – PPO | Admitting: Neurology

## 2018-11-29 VITALS — Temp 98.0°F | Ht 61.0 in | Wt 230.0 lb

## 2018-11-29 DIAGNOSIS — G43009 Migraine without aura, not intractable, without status migrainosus: Secondary | ICD-10-CM | POA: Diagnosis not present

## 2018-11-29 MED ORDER — RIZATRIPTAN BENZOATE 10 MG PO TBDP: ORAL_TABLET | ORAL | 3 refills | Status: DC

## 2018-11-29 MED ORDER — TOPIRAMATE 50 MG PO TABS: 50.0000 mg | ORAL_TABLET | Freq: Every day | ORAL | 3 refills | Status: DC

## 2018-11-29 NOTE — Patient Instructions (Signed)
Migraine Recommendations: 1.  Start topiramate 50mg  tablet.  Take 1/2 tablet at bedtime for one week, then increase to 1 tablet at bedtime.  If migraines not improved by end of prescription, contact me and we can increase dose. 2.  Take rizatriptan 10mg  at earliest onset of headache.  May repeat dose once in 2 hours if needed.  Do not exceed two tablets in 24 hours.  STOP butalbital and all over the counter pain relievers. 3.  Limit use of pain relievers to no more than 2 days out of the week.  These medications include acetaminophen, ibuprofen, triptans and narcotics.  This will help reduce risk of rebound headaches. 4.  Be aware of common food triggers such as processed sweets, processed foods with nitrites (such as deli meat, hot dogs, sausages), foods with MSG, alcohol (such as wine), chocolate, certain cheeses, certain fruits (dried fruits, bananas, pineapple), vinegar, diet soda. 4.  Avoid caffeine 5.  Routine exercise 6.  Proper sleep hygiene 7.  Stay adequately hydrated with water 8.  Keep a headache diary. 9.  Maintain proper stress management. 10.  Do not skip meals. 11.  Consider supplements:  Magnesium citrate 400mg  to 600mg  daily, riboflavin 400mg , Coenzyme Q 10 100mg  three times daily

## 2018-12-01 ENCOUNTER — Encounter: Payer: Self-pay | Admitting: Family Medicine

## 2018-12-01 ENCOUNTER — Other Ambulatory Visit: Payer: Self-pay

## 2018-12-01 ENCOUNTER — Ambulatory Visit: Payer: BC Managed Care – PPO | Admitting: Nurse Practitioner

## 2018-12-01 VITALS — BP 132/84 | HR 77 | Temp 97.1°F | Resp 14 | Ht 63.0 in | Wt 284.7 lb

## 2018-12-01 DIAGNOSIS — D509 Iron deficiency anemia, unspecified: Secondary | ICD-10-CM

## 2018-12-01 DIAGNOSIS — E559 Vitamin D deficiency, unspecified: Secondary | ICD-10-CM

## 2018-12-01 DIAGNOSIS — E739 Lactose intolerance, unspecified: Secondary | ICD-10-CM

## 2018-12-01 DIAGNOSIS — R7303 Prediabetes: Secondary | ICD-10-CM

## 2018-12-01 DIAGNOSIS — G43009 Migraine without aura, not intractable, without status migrainosus: Secondary | ICD-10-CM | POA: Insufficient documentation

## 2018-12-01 DIAGNOSIS — N946 Dysmenorrhea, unspecified: Secondary | ICD-10-CM

## 2018-12-01 DIAGNOSIS — D219 Benign neoplasm of connective and other soft tissue, unspecified: Secondary | ICD-10-CM

## 2018-12-01 DIAGNOSIS — Z7689 Persons encountering health services in other specified circumstances: Secondary | ICD-10-CM

## 2018-12-01 DIAGNOSIS — R5383 Other fatigue: Secondary | ICD-10-CM

## 2018-12-01 DIAGNOSIS — K219 Gastro-esophageal reflux disease without esophagitis: Secondary | ICD-10-CM | POA: Insufficient documentation

## 2018-12-01 MED ORDER — OMEPRAZOLE 20 MG PO CPDR: DELAYED_RELEASE_CAPSULE | ORAL | 3 refills | Status: DC

## 2018-12-01 NOTE — Assessment & Plan Note (Signed)
Did not discuss much today, but will at f/up appt  Wt Readings from Last 5 Encounters:  12/01/18 284 lb 11.2 oz (129.1 kg)  11/29/18 230 lb (104.3 kg)  01/14/18 282 lb 1.6 oz (128 kg)  01/12/18 281 lb 8 oz (127.7 kg)  01/11/18 280 lb (127 kg)   Weight mostly stable from over the past year Needs lifestyle changes and likely needs referral to medical weight management or dietician consult?

## 2018-12-01 NOTE — Progress Notes (Signed)
Name: Sherri Robertson   MRN: [id number]    DOB: 1985/08/26   Date:12/01/2018       Progress Note  Chief Complaint  Patient presents with  . Migraine    flma     Subjective:   Sherri Robertson is a 33 y.o. female, presents to clinic with CC of Migraines and need for FMLA paperwork to be filled out.  She also needs to establish with new provider as PCP, and is overdue for follow up on several chronic conditions.  Migraine syndrome, went to neurology for med changes yesterday -with Dr. Sanda Klein, was going to manage missed days at work with [facility] - she's her today for that.  Per review of neurology visit yesterday, meds changed to rizatriptan and topamax, told to d/c NSAIDs, tylenol and Fioricet.  She had a migraine that started yesterday has been gradually improving with use of 2 doses of rizatriptan, but it did not completely go away she continues to have photosensitivity and her migraine pain is currently rated 4 out of 10.  She is wearing her sunglasses in the exam room to minimize pain from the light.  She does feel like increased stress may be one of her triggers of her headaches.   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  The order was from ophthalmology but I cannot see any progress notes regarding that visit.  May also need to r/o IIH.  Estrogen may also be a trigger, but she is currently not on OCPs.  She reports that headaches began when she was a teenager and only over the past several months has they become more frequent.  She used to get them about 3 or 4 times a year total and now she is getting them 1-2 times a month.  She is keeping a headache journal and will be following up with neurology.  She was asked if possible sleep deprivation or working night shift is 1 of her causes for worsening headaches but she has been working  third shift at the hospital, [employer]12[employer] for about 4 years and only the past couple months have her headaches become more frequent.   Microcytic anemia  - on iron supplement 2x a day, some GI upset with it, but she feels awful when not taking iron, so she manages the abdominal discomfort.  Still having very heavy periods.  Hx of blood transfusions, fibroids, dysmenorrhea and now menorrhagia.  Hasn't seen OBGYN in a while.  Off and on OCPs to help manage cycles, hasn't been on for a few months.   She is tired, gets SOB easily, has PICA.  Denies CP, near syncope, paresthesias, palpitations, cold intolerance.   Mom and sister with hx of anemia, no known family history of coagulopathy. Per chart review patient has had iron panel done several times in the past, has had electrophoresis which was negative -did see hematology in 2018, and was seen previously by Dr. Getting.  Per hematology, Dr. Mike Gip and Honor Loh NP, patient has chronic anemia, hemoglobin ranging 8.2 - 10.5, with persistent microcytic indices with MCV 62.1-67.  Last lab work done here and available in EMR was from April 2019 her hemoglobin had improved to 11.3 MCV of 71.7 and RDW was still elevated.  Hematology during 02/2017 admission concluded possible thalassemia due to  low Hemoglobin A2.  Also had past med hx of asymptomatic ulcer at age 38.  GERD/epigastic discomfort, burning, recently gradually worsening. Longstanding GI issues, sensitive to foods, sensitive to dairy in particular and avoids.  She has epigastric burning and dx of GERD and is not currently taking prilosec.  PMHx of ulcers, hasn't follow up with GI in some time, states she was due for EGD.  She denies reflux sx, mostly non-radiation epigastric burning worse with certain foods/drinks like alcohol, and only was taking prilosec PRN - a few times a month would take for a few days at a time.  No bloating, indigestion, constipation, diarrhea, melena, hematochezia.  Prediabetes-   Denies polydipsia, polyuria, excessive hunger, weight loss.   Monitoring prediabetes for the past 3 years with PCP.  Tried saxenda, but could not get coverage for it.  Pt did not want to try metformin.  Previously referred to bariatric surgery/weightloss - but I do not see any visits with the surgical specialist. Pertinent Labs: Lab Results  Component Value Date   HGBA1C 6.3 (H) 07/28/2017   HGBA1C 6.1 (H) 02/02/2017   HGBA1C 6.5 (H) 08/01/2015      Component Value Date/Time   CHOL 187 07/28/2017 0927   TRIG 60 07/28/2017 0927   HDL 73 07/28/2017 0927   [place] 99 07/28/2017 0927   CREATININE 0.66 07/28/2017 0927   Wt Readings from Last 3 Encounters:  12/01/18 284 lb 11.2 oz (129.1 kg)  11/29/18 230 lb (104.3 kg)  01/14/18 282 lb 1.6 oz (128 kg)    Vit d deficiency -   labs were over a year ago, she was given prescription strength vitamin D supplement which she completed but she never came back for repeat labs and she never took over-the-counter supplements           Patient Active Problem List   Diagnosis Date Noted  . Prediabetes 02/05/2017  . 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 Thyroid nodule 01/22/2017  . Vitamin D deficiency 01/22/2017  . Acanthosis nigricans 08/05/2015  . Anxiety and depression 01/26/2015  . Dysmenorrhea 01/26/2015  . Fibroid 01/26/2015  . Anemia, iron deficiency 09/21/2009  . Morbid obesity ([place]) 07/27/2009    Past Surgical History:  Procedure Laterality Date  . CESAREAN SECTION    . TONSILLECTOMY      Family History  Problem Relation Age of Onset  . Hypertension Mother   . Depression Mother   . Hyperlipidemia Mother   . Depression Sister   . Anxiety disorder Sister   . Anxiety disorder Brother   . Eczema Daughter   . COPD Maternal Grandmother   . Emphysema Maternal Grandmother   . Cancer Maternal Grandfather        stomach cancer    Social History   Socioeconomic History  . Marital status: Single    Spouse name: Not on file   . Number of children: 1  . Years of education: Not on file  . Highest education level: Some college, no degree  Occupational History  . Occupation: Clinical support Engineer, production: [employer]  Social Needs  . Financial resource strain: Not on file  . Food insecurity    Worry: Not on file    Inability: Not on file  . Transportation needs    Medical: Not on file    Non-medical: Not on file  Tobacco Use  . Smoking status: Never Smoker  . Smokeless tobacco: Never Used  Substance and Sexual Activity  .  Alcohol use: Yes    Comment: occassional - 3 glasses wine/month  . Drug use: No  . Sexual activity: Yes    Birth control/protection: None  Lifestyle  . Physical activity    Days per week: Not on file    Minutes per session: Not on file  . Stress: Not on file  Relationships  . Social Herbalist on phone: Not on file    Gets together: Not on file    Attends religious service: Not on file    Active member of club or organization: Not on file    Attends meetings of clubs or organizations: Not on file    Relationship status: Not on file  . Intimate partner violence    Fear of current or ex partner: Not on file    Emotionally abused: Not on file    Physically abused: Not on file    Forced sexual activity: Not on file  Other Topics Concern  . Not on file  Social History Narrative   Patient is right-handed. She lives in a two level home. She drinks coffee and tea 2-4 x a week. She does not exercise.     Current Outpatient Medications:  .  bismuth subsalicylate (PEPTO BISMOL) 262 MG/15ML suspension, Take 30 mLs by mouth every 6 (six) hours as needed., Disp: , Rfl:  .  ferrous sulfate 325 (65 FE) MG EC tablet, Take 325 mg by mouth 2 (two) times daily., Disp: , Rfl:  .  omeprazole (PRILOSEC) 20 MG capsule, TAKE 1 CAPSULE BY MOUTH EVERY DAY, Disp: 30 capsule, Rfl: 3 .  rizatriptan (MAXALT-MLT) 10 MG disintegrating tablet, Take 1 tablet earliest onset of migraine.  May  repeat in 2 hours if needed.  Maximum 2 tablets in 24 hours, Disp: 9 tablet, Rfl: 3 .  topiramate (TOPAMAX) 50 MG tablet, Take 1 tablet (50 mg total) by mouth at bedtime., Disp: 30 tablet, Rfl: 3  No Known Allergies  I personally reviewed active problem list, medication list, allergies, family history, social history, notes from last encounter, lab results with the patient/caregiver today.  Review of Systems  Constitutional: Negative.  Negative for activity change, appetite change, chills, diaphoresis, fatigue, fever and unexpected weight change.  HENT: Negative.   Eyes: Negative.   Respiratory: Positive for shortness of breath. Negative for cough, choking, chest tightness and wheezing.   Cardiovascular: Negative.  Negative for chest pain, palpitations and leg swelling.  Gastrointestinal: Negative.  Negative for blood in stool, constipation, diarrhea, nausea and vomiting.  Endocrine: Negative.  Negative for cold intolerance, heat intolerance, polydipsia, polyphagia and polyuria.  Genitourinary: Positive for menstrual problem. Negative for pelvic pain.  Musculoskeletal: Negative.  Negative for arthralgias, gait problem, joint swelling and myalgias.  Skin: Negative.  Negative for color change, pallor and rash.  Allergic/Immunologic: Negative.   Neurological: Positive for headaches. Negative for dizziness, syncope, facial asymmetry, weakness, light-headedness and numbness.  Hematological: Negative.  Negative for adenopathy. Does not bruise/bleed easily.  Psychiatric/Behavioral: Negative.  Negative for confusion, dysphoric mood, self-injury and suicidal ideas. The patient is not nervous/anxious.      Objective:    Vitals:   12/01/18 1530  BP: 132/84  Pulse: 77  Resp: 14  Temp: (!) 97.1 F (36.2 C)  SpO2: 99%  Weight: 284 lb 11.2 oz (129.1 kg)  Height: 5\' 3"  (1.6 m)    Body mass index is 50.43 kg/m.  Physical Exam Vitals signs and nursing note reviewed.  Constitutional:  General: She is not in acute distress.    Appearance: Normal appearance. She is well-developed. She is obese. She is not ill-appearing, toxic-appearing or diaphoretic.     Comments: Well appearing obese female, wearing sunglasses and mask, pleasant  HENT:     Head: Normocephalic and atraumatic.     Right Ear: External ear normal.     Left Ear: External ear normal.     Mouth/Throat:     Pharynx: Uvula midline.  Eyes:     General: Lids are normal. No scleral icterus.       Right eye: No discharge.        Left eye: No discharge.     Conjunctiva/sclera: Conjunctivae normal.  Neck:     Musculoskeletal: Normal range of motion and neck supple.     Trachea: Phonation normal. No tracheal deviation.  Cardiovascular:     Rate and Rhythm: Normal rate and regular rhythm.     Pulses: Normal pulses.          Radial pulses are 2+ on the right side and 2+ on the left side.       Posterior tibial pulses are 2+ on the right side and 2+ on the left side.     Heart sounds: Normal heart sounds. No murmur. No friction rub. No gallop.   Pulmonary:     Effort: Pulmonary effort is normal. No respiratory distress.     Breath sounds: Normal breath sounds. No stridor. No wheezing, rhonchi or rales.  Chest:     Chest wall: No tenderness.  Abdominal:     General: Bowel sounds are normal. There is no distension.     Palpations: Abdomen is soft.     Tenderness: There is no abdominal tenderness. There is no guarding or rebound.  Musculoskeletal: Normal range of motion.        General: No deformity.     Right lower leg: No edema.     Left lower leg: No edema.  Lymphadenopathy:     Cervical: No cervical adenopathy.  Skin:    General: Skin is warm and dry.     Capillary Refill: Capillary refill takes less than 2 seconds.     Coloration: Skin is not jaundiced or pale.     Findings: No bruising or rash.  Neurological:     Mental Status: She is alert and oriented to person, place, and time.     Motor: No  abnormal muscle tone.     Gait: Gait normal.  Psychiatric:        Mood and Affect: Mood normal.        Speech: Speech normal.        Behavior: Behavior normal.        Thought Content: Thought content normal.        Judgment: Judgment normal.        PHQ2/9: Depression screen [facility] 2/9 12/01/2018 11/12/2018 01/14/2018 07/28/2017 01/22/2017  Decreased Interest 0 0 0 0 0  Down, Depressed, Hopeless 0 0 0 0 0  PHQ - 2 Score 0 0 0 0 0  Altered sleeping 0 0 0 - -  Tired, decreased energy 0 0 0 - -  Change in appetite 0 0 0 - -  Feeling bad or failure about yourself  0 0 0 - -  Trouble concentrating 0 0 0 - -  Moving slowly or fidgety/restless 0 0 0 - -  Suicidal thoughts 0 0 0 - -  PHQ-9 Score 0 0 - - -  Difficult doing work/chores Not difficult at all Not difficult at all Not difficult at all - -    phq 9 is negative Reviewed by me today  Fall Risk: Fall Risk  12/01/2018 11/29/2018 11/12/2018 01/14/2018 01/12/2018  Falls in the past year? 0 0 0 No No  Number falls in past yr: 0 - 0 - -  Injury with Fall? 0 - 0 - -  Follow up - Falls evaluation completed - - -      Functional Status Survey: Is the patient deaf or have difficulty hearing?: No Does the patient have difficulty seeing, even when wearing glasses/contacts?: No Does the patient have difficulty concentrating, remembering, or making decisions?: No Does the patient have difficulty walking or climbing stairs?: No Does the patient have difficulty dressing or bathing?: No Does the patient have difficulty doing errands alone such as visiting a doctor's office or shopping?: No    Assessment & Plan:   Problem List Items Addressed This Visit      Cardiovascular and Mediastinum   Migraine without aura and without status migrainosus, not intractable - Primary    per neuro - FMLA paperwork completed Encouraged and reiterated neuro's plan and pt instructions, keep HA journal, follow med changes and tapers        Digestive    Gastroesophageal reflux disease    pepcid or zantac PRN Restart prilosec daily if having sx more than half days Lifestyle changes and diet for GERD - info given in AVS      Relevant Medications   omeprazole (PRILOSEC) 20 MG capsule   Other Relevant Orders   Ambulatory referral to Gastroenterology     Genitourinary   Dysmenorrhea    Follow up with OBGYN - Dr. Marcelline Mates OCPs may not be an option with worsening migraine freq Longstanding anemia -iron deficiency from recurrent blood loss with menses - periods regular right now but very heavy Also EMR has hx of fibroids - may have some tx options from OBGYN to decrease blood loss      Relevant Orders   CBC with Differential/Platelet   Ambulatory referral to Obstetrics / Gynecology     Other   Anemia, iron deficiency    Iron deficiency and heavy periods with hx of fibroids Refer back to OBGYN Recheck CBC and iron panel She's supplementing daily with iron still mildly sx of anemia - exertional dyspnea mostly and fatigue      Relevant Orders   CBC with Differential/Platelet   Iron, TIBC and Ferritin Panel   Ambulatory referral to Obstetrics / Gynecology   Ambulatory referral to Gastroenterology   Obesity, morbid, BMI 50 or higher ([place])    Did not discuss much today, but will at f/up appt  Wt Readings from Last 5 Encounters:  12/01/18 284 lb 11.2 oz (129.1 kg)  11/29/18 230 lb (104.3 kg)  01/14/18 282 lb 1.6 oz (128 kg)  01/12/18 281 lb 8 oz (127.7 kg)  01/11/18 280 lb (127 kg)   Weight mostly stable from over the past year Needs lifestyle changes and likely needs referral to medical weight management or dietician consult?        Fibroid   Relevant Orders   Ambulatory referral to Obstetrics / Gynecology   Vitamin D deficiency    Last labs Vit D 11, 07/2017 No current supplement Recheck, likely will need Rx Vit D again, followed up daily OTC supplement Advised to have some exposure to sunlight to help, no dairy in diet  currently  Relevant Orders   VITAMIN D 25 Hydroxy (Vit-D Deficiency, Fractures)   Prediabetes    Recheck labs Diet and exercise      Relevant Orders   COMPLETE METABOLIC PANEL WITH GFR   Hemoglobin A1c    Other Visit Diagnoses    Fatigue, unspecified type       recheck anemia and vit d   Relevant Orders   CBC with Differential/Platelet   COMPLETE METABOLIC PANEL WITH GFR   Magnesium   VITAMIN D 25 Hydroxy (Vit-D Deficiency, Fractures)   Lactose intolerance, unspecified       avoiding in diet to see if it helps with GI issues and migraines   Encounter to establish care with new doctor       extensive chart review, PCP left practice       Return in about 3 months (around 03/03/2019) for f/up anemia, vit D - will need repeat labs.   Delsa Grana, PA-C 12/01/18 3:36 PM

## 2018-12-01 NOTE — Patient Instructions (Addendum)
We will call you with your labs  Be sure to start supplements recommended by Neurology -  See below for their complete recommendations.  Follow up with Dr. Marcelline Mates for heavy periods and anemia - with history of polyps they may have some treatments options or procedures that will help.  I anticipate that you will need prescription strength Vit D again - Will let you know when labs come back, but when not taking the high dose you need to supplement with 1000-2000 IU Vit D a day in at least 2 doses (1000 IU in am and 1000 IU in pm) and try and get some exposure to natural sunlight.  Restart prilosec to help with your stomach discomfort - you can also try pepcid or zantac over the counter for more immediate relief.    From Neurology: Migraine Recommendations: 1.  Start topiramate 50mg  tablet.  Take 1/2 tablet at bedtime for one week, then increase to 1 tablet at bedtime.  If migraines not improved by end of prescription, contact me and we can increase dose. 2.  Take rizatriptan 10mg  at earliest onset of headache.  May repeat dose once in 2 hours if needed.  Do not exceed two tablets in 24 hours.  STOP butalbital and all over the counter pain relievers. 3.  Limit use of pain relievers to no more than 2 days out of the week.  These medications include acetaminophen, ibuprofen, triptans and narcotics.  This will help reduce risk of rebound headaches. 4.  Be aware of common food triggers such as processed sweets, processed foods with nitrites (such as deli meat, hot dogs, sausages), foods with MSG, alcohol (such as wine), chocolate, certain cheeses, certain fruits (dried fruits, bananas, pineapple), vinegar, diet soda. 4.  Avoid caffeine 5.  Routine exercise 6.  Proper sleep hygiene 7.  Stay adequately hydrated with water 8.  Keep a headache diary. 9.  Maintain proper stress management. 10.  Do not skip meals. 11.  Consider supplements:  Magnesium citrate 400mg  to 600mg  daily, riboflavin 400mg ,  Coenzyme Q 10 100mg  three times daily    Food Choices for Gastroesophageal Reflux Disease, Adult When you have gastroesophageal reflux disease (GERD), the foods you eat and your eating habits are very important. Choosing the right foods can help ease your discomfort. Think about working with a nutrition specialist (dietitian) to help you make good choices. What are tips for following this plan?  Meals  Choose healthy foods that are low in fat, such as fruits, vegetables, whole grains, low-fat dairy products, and lean meat, fish, and poultry.  Eat small meals often instead of 3 large meals a day. Eat your meals slowly, and in a place where you are relaxed. Avoid bending over or lying down until 2-3 hours after eating.  Avoid eating meals 2-3 hours before bed.  Avoid drinking a lot of liquid with meals.  Cook foods using methods other than frying. Bake, grill, or broil food instead.  Avoid or limit: ? Chocolate. ? Peppermint or spearmint. ? Alcohol. ? Pepper. ? Black and decaffeinated coffee. ? Black and decaffeinated tea. ? Bubbly (carbonated) soft drinks. ? Caffeinated energy drinks and soft drinks.  Limit high-fat foods such as: ? Fatty meat or fried foods. ? Whole milk, cream, butter, or ice cream. ? Nuts and nut butters. ? Pastries, donuts, and sweets made with butter or shortening.  Avoid foods that cause symptoms. These foods may be different for everyone. Common foods that cause symptoms include: ? Tomatoes. ?  Oranges, lemons, and limes. ? Peppers. ? Spicy food. ? Onions and garlic. ? Vinegar. Lifestyle  Maintain a healthy weight. Ask your doctor what weight is healthy for you. If you need to lose weight, work with your doctor to do so safely.  Exercise for at least 30 minutes for 5 or more days each week, or as told by your doctor.  Wear loose-fitting clothes.  Do not smoke. If you need help quitting, ask your doctor.  Sleep with the head of your bed higher  than your feet. Use a wedge under the mattress or blocks under the bed frame to raise the head of the bed. Summary  When you have gastroesophageal reflux disease (GERD), food and lifestyle choices are very important in easing your symptoms.  Eat small meals often instead of 3 large meals a day. Eat your meals slowly, and in a place where you are relaxed.  Limit high-fat foods such as fatty meat or fried foods.  Avoid bending over or lying down until 2-3 hours after eating.  Avoid peppermint and spearmint, caffeine, alcohol, and chocolate. This information is not intended to replace advice given to you by your health care provider. Make sure you discuss any questions you have with your health care provider. Document Released: 09/30/2011 Document Revised: 07/22/2018 Document Reviewed: 05/06/2016 Elsevier Patient Education  [place].   Iron Deficiency Anemia, Adult Iron-deficiency anemia is when you have a low amount of red blood cells or hemoglobin. This happens because you have too little iron in your body. Hemoglobin carries oxygen to parts of the body. Anemia can cause your body to not get enough oxygen. It may or may not cause symptoms. Follow these instructions at home: Medicines  Take over-the-counter and prescription medicines only as told by your doctor. This includes iron pills (supplements) and vitamins.  If you cannot handle taking iron pills by mouth, ask your doctor about getting iron through: ? A vein (intravenously). ? A shot (injection) into a muscle.  Take iron pills when your stomach is empty. If you cannot handle this, take them with food.  Do not drink milk or take antacids at the same time as your iron pills.  To prevent trouble pooping (constipation), eat fiber or take medicine (stool softener) as told by your doctor. Eating and drinking   Talk with your doctor before changing the foods you eat. He or she may tell you to eat foods that have a lot of  iron, such as: ? Liver. ? Lowfat (lean) beef. ? Breads and cereals that have iron added to them (fortified breads and cereals). ? Eggs. ? Dried fruit. ? Dark green, leafy vegetables.  Drink enough fluid to keep your pee (urine) clear or pale yellow.  Eat fresh fruits and vegetables that are high in vitamin C. They help your body to use iron. Foods with a lot of vitamin C include: ? Oranges. ? Peppers. ? Tomatoes. ? Mangoes. General instructions  Return to your normal activities as told by your doctor. Ask your doctor what activities are safe for you.  Keep yourself clean, and keep things clean around you (your surroundings). Anemia can make you get sick more easily.  Keep all follow-up visits as told by your doctor. This is important. Contact a doctor if:  You feel sick to your stomach (nauseous).  You throw up (vomit).  You feel weak.  You are sweating for no clear reason.  You have trouble pooping, such as: ? Pooping (having  a bowel movement) less than 3 times a week. ? Straining to poop. ? Having poop that is hard, dry, or larger than normal. ? Feeling full or bloated. ? Pain in the lower belly. ? Not feeling better after pooping. Get help right away if:  You pass out (faint). If this happens, do not drive yourself to the hospital. Call your local emergency services (911 in the [place]  You have chest pain.  You have shortness of breath that: ? Is very bad. ? Gets worse with physical activity.  You have a fast heartbeat.  You get light-headed when getting up from sitting or lying down. This information is not intended to replace advice given to you by your health care provider. Make sure you discuss any questions you have with your health care provider. Document Released: 05/03/2010 Document Revised: 03/13/2017 Document Reviewed: 12/19/2015 Elsevier Patient Education  2020 [employer].

## 2018-12-01 NOTE — Assessment & Plan Note (Signed)
per neuro - FMLA paperwork completed Encouraged and reiterated neuro's plan and pt instructions, keep HA journal, follow med changes and tapers

## 2018-12-01 NOTE — Assessment & Plan Note (Signed)
pepcid or zantac PRN Restart prilosec daily if having sx more than half days Lifestyle changes and diet for GERD - info given in AVS

## 2018-12-01 NOTE — Assessment & Plan Note (Signed)
Last labs Vit D 11, 07/2017 No current supplement Recheck, likely will need Rx Vit D again, followed up daily OTC supplement Advised to have some exposure to sunlight to help, no dairy in diet currently

## 2018-12-01 NOTE — Assessment & Plan Note (Signed)
Recheck labs Diet and exercise

## 2018-12-01 NOTE — Assessment & Plan Note (Signed)
Iron deficiency and heavy periods with hx of fibroids Refer back to OBGYN Recheck CBC and iron panel She's supplementing daily with iron still mildly sx of anemia - exertional dyspnea mostly and fatigue

## 2018-12-01 NOTE — Assessment & Plan Note (Signed)
Follow up with OBGYN - Dr. Marcelline Mates OCPs may not be an option with worsening migraine freq Longstanding anemia -iron deficiency from recurrent blood loss with menses - periods regular right now but very heavy Also EMR has hx of fibroids - may have some tx options from OBGYN to decrease blood loss

## 2018-12-02 ENCOUNTER — Ambulatory Visit: Payer: BC Managed Care – PPO | Admitting: Nurse Practitioner

## 2018-12-02 ENCOUNTER — Other Ambulatory Visit: Payer: Self-pay | Admitting: Family Medicine

## 2018-12-02 LAB — COMPLETE METABOLIC PANEL WITH GFR
AG Ratio: 1.1 (calc) (ref 1.0–2.5)
ALT: 6 U/L (ref 6–29)
AST: 10 U/L (ref 10–30)
Albumin: 3.6 g/dL (ref 3.6–5.1)
Alkaline phosphatase (APISO): 80 U/L (ref 31–125)
BUN: 10 mg/dL (ref 7–25)
CO2: 26 mmol/L (ref 20–32)
Calcium: 8.8 mg/dL (ref 8.6–10.2)
Chloride: 107 mmol/L (ref 98–110)
Creat: 0.66 mg/dL (ref 0.50–1.10)
GFR, Est African American: 135 mL/min/{1.73_m2} (ref >=60)
GFR, Est Non African American: 116 mL/min/{1.73_m2} (ref >=60)
Globulin: 3.2 g/dL (calc) (ref 1.9–3.7)
Glucose, Bld: 93 mg/dL (ref 65–99)
Potassium: 4.3 mmol/L (ref 3.5–5.3)
Sodium: 138 mmol/L (ref 135–146)
Total Bilirubin: 0.3 mg/dL (ref 0.2–1.2)
Total Protein: 6.8 g/dL (ref 6.1–8.1)

## 2018-12-02 LAB — CBC WITH DIFFERENTIAL/PLATELET
Absolute Monocytes: 495 cells/uL (ref 200–950)
Basophils Absolute: 30 cells/uL (ref 0–200)
Basophils Relative: 0.4 %
Eosinophils Absolute: 90 cells/uL (ref 15–500)
Eosinophils Relative: 1.2 %
HCT: 36 % (ref 35.0–45.0)
Hemoglobin: 10.9 g/dL — ABNORMAL LOW (ref 11.7–15.5)
Lymphs Abs: 2723 cells/uL (ref 850–3900)
MCH: 21.3 pg — ABNORMAL LOW (ref 27.0–33.0)
MCHC: 30.3 g/dL — ABNORMAL LOW (ref 32.0–36.0)
MCV: 70.3 fL — ABNORMAL LOW (ref 80.0–100.0)
MPV: 9.4 fL (ref 7.5–12.5)
Monocytes Relative: 6.6 %
Neutro Abs: 4163 cells/uL (ref 1500–7800)
Neutrophils Relative %: 55.5 %
Platelets: 470 10*3/uL — ABNORMAL HIGH (ref 140–400)
RBC: 5.12 10*6/uL — ABNORMAL HIGH (ref 3.80–5.10)
RDW: 16.4 % — ABNORMAL HIGH (ref 11.0–15.0)
Total Lymphocyte: 36.3 %
WBC: 7.5 10*3/uL (ref 3.8–10.8)

## 2018-12-02 LAB — MAGNESIUM: Magnesium: 2.2 mg/dL (ref 1.5–2.5)

## 2018-12-02 LAB — IRON,TIBC AND FERRITIN PANEL
%SAT: 6 % (calc) — ABNORMAL LOW (ref 16–45)
Ferritin: 56 ng/mL (ref 16–154)
Iron: 17 ug/dL — ABNORMAL LOW (ref 40–190)
TIBC: 276 mcg/dL (calc) (ref 250–450)

## 2018-12-02 LAB — HEMOGLOBIN A1C
Hgb A1c MFr Bld: 6.3 % of total Hgb — ABNORMAL HIGH (ref <5.7)
Mean Plasma Glucose: 134 (calc)
eAG (mmol/L): 7.4 (calc)

## 2018-12-02 LAB — VITAMIN D 25 HYDROXY (VIT D DEFICIENCY, FRACTURES): Vit D, 25-Hydroxy: 13 ng/mL — ABNORMAL LOW (ref 30–100)

## 2018-12-02 MED ORDER — VITAMIN D (ERGOCALCIFEROL) 1.25 MG (50000 UNIT) PO CAPS: 50000.0000 [IU] | ORAL_CAPSULE | ORAL | 0 refills | Status: DC

## 2018-12-03 ENCOUNTER — Other Ambulatory Visit: Payer: Self-pay | Admitting: Neurology

## 2018-12-03 MED ORDER — SUMATRIPTAN SUCCINATE 100 MG PO TABS: ORAL_TABLET | ORAL | 2 refills | Status: DC

## 2018-12-03 NOTE — Progress Notes (Signed)
Rizatriptan  Ineffective Start sumatriptan 100mg 

## 2018-12-13 ENCOUNTER — Encounter: Payer: Self-pay | Admitting: Family Medicine

## 2018-12-14 ENCOUNTER — Encounter: Payer: BC Managed Care – PPO | Admitting: Obstetrics and Gynecology

## 2019-01-06 ENCOUNTER — Ambulatory Visit: Payer: BC Managed Care – PPO | Admitting: Gastroenterology

## 2019-01-18 ENCOUNTER — Encounter: Payer: Self-pay | Admitting: Obstetrics and Gynecology

## 2019-01-18 ENCOUNTER — Other Ambulatory Visit: Payer: Self-pay

## 2019-01-18 ENCOUNTER — Ambulatory Visit: Payer: BC Managed Care – PPO | Admitting: Obstetrics and Gynecology

## 2019-01-18 VITALS — BP 112/76 | HR 84 | Ht 63.0 in | Wt 285.9 lb

## 2019-01-18 DIAGNOSIS — Z862 Personal history of diseases of the blood and blood-forming organs and certain disorders involving the immune mechanism: Secondary | ICD-10-CM

## 2019-01-18 DIAGNOSIS — Z86018 Personal history of other benign neoplasm: Secondary | ICD-10-CM | POA: Diagnosis not present

## 2019-01-18 DIAGNOSIS — Z8742 Personal history of other diseases of the female genital tract: Secondary | ICD-10-CM

## 2019-01-18 LAB — POCT URINE PREGNANCY: Preg Test, Ur: NEGATIVE

## 2019-01-18 NOTE — Progress Notes (Signed)
GYNECOLOGY PROGRESS NOTE  Subjective:    [name], female    DOB: Nov 14, 1985, 33 y.o.   MRN: [id number]  HPI  Patient is a 33 y.o. [id number] female who presents as a referral from her PCP Delsa Grana, PA-C at [facility] secondary to iron deficiency anemia.  Patient has a history of irregular cycles (although notes that over this past year she has only had 1 skipped cycle), and fibroid uterus.  She reports that she has had a history of anemia since her teenage years. She was last seen at [facility] in 2019 for complaints of irregular cycle x 1 with prolonged bleeding (lasting over 2 weeks).  Was given a prescription of Provera which helped to correct her cycles.  Her menstrual cycles currently last 3-4 days, with moderate flow the first 2 days (changes a pad/tampon q 3-4 hrs), then decreasing, with moderate dysmenorrhea.  She has had a history of iron infusions in the past. She has recently restarted PO iron supplementation. Last Hgb was 10.9 on 12/01/2018.  She currently denies any fatigue, chest pain, SOB, dizziness.    Gynecologic History:  Patient's last menstrual period was 12/08/2018 (exact date).  See above for menstrual history.  Contraception: None Last pap smear: 1 year ago, was normal.  History of 1 abnormal pap smear x 1 in 2016.    OB History  Gravida Para Term Preterm AB Living  1 1 1         SAB TAB Ectopic Multiple Live Births               # Outcome Date GA Lbr Len/2nd Weight Sex Delivery Anes PTL Lv  1 Term 2012    F CS-Unspec       The following portions of the patient's history were reviewed and updated as appropriate:   She  has a past medical history of Abnormal cervical Pap smear with positive HPV DNA test (01/26/2015), Anemia, GERD (gastroesophageal reflux disease), Low back pain (01/26/2015), and Migraine.   She  has a past surgical history that includes Cesarean section and Tonsillectomy.   Her family history includes Anxiety  disorder in her brother and sister; COPD in her maternal grandmother; Cancer in her maternal grandfather; Depression in her mother and sister; Eczema in her daughter; Emphysema in her maternal grandmother; Hyperlipidemia in her mother; Hypertension in her mother.   She has a current medication list which includes the following prescription(s): bismuth subsalicylate, ferrous sulfate, omeprazole, sumatriptan, topiramate, and vitamin d (ergocalciferol).  Review of Systems Pertinent items noted in HPI and remainder of comprehensive ROS otherwise negative.   Objective:   Blood pressure 112/76, pulse 84, height 5\' 3"  (1.6 m), weight 285 lb 14.4 oz (129.7 kg), last menstrual period 12/08/2018. General appearance: alert and no distress Abdomen: soft, non-tender; bowel sounds normal; no masses,  no organomegaly Pelvic: deferred.    Imaging:  No recent imaging found.   Assessment:   1. History of irregular menstrual cycles   2. History of anemia   3. History of uterine fibroid     Plan:   1. History of irregular (skipped cycles) - patient reports cycle have been much more regular this year, with only most recent menses absent for the month of September.  UPT negative today. Cycles are overall normal (regular flow, lasting 3-4 days), not likely a significant cause of her current mild anemia. Discussed option of reducing the frequency of menstrual cycles with use of contraception,  patient declines at this time.  Also discussed the possibility of PCOS as a diagnosis for her abnormal cycles (as she also reports issues with difficulty losing weight). Does not have excessive facial hair growth or other signs of androgenism though. Will consider workup at next visit.  2. Patient has undergone workup in the past for her anemia with no significant findings.  Has a h/o iron infusions in the past. Patient has started ferrous sulfate TID.  Labs to be rechecked in the next 1-2 months by PCP. Also encouraged  increasing dietary intake of iron.  3. History of uterine fibroid per patient, no recent imaging noted. Discussed that usually if fibroids are causing pain or excessive bleeding, can proceed with mangement options like hormones or surgery.  Patient otherwise denies any major issues.  Will order an ultrasound to f/u growth of the fibroids. RTC in 2-3 weeks.    A total of 15 minutes were spent face-to-face with the patient during this encounter and over half of that time dealt with counseling and coordination of care.   Rubie Maid, MD [facility]

## 2019-01-18 NOTE — Patient Instructions (Addendum)
Iron Deficiency Anemia, Adult Iron-deficiency anemia is when you have a low amount of red blood cells or hemoglobin. This happens because you have too little iron in your body. Hemoglobin carries oxygen to parts of the body. Anemia can cause your body to not get enough oxygen. It may or may not cause symptoms. Follow these instructions at home: Medicines  Take over-the-counter and prescription medicines only as told by your doctor. This includes iron pills (supplements) and vitamins.  If you cannot handle taking iron pills by mouth, ask your doctor about getting iron through: ? A vein (intravenously). ? A shot (injection) into a muscle.  Take iron pills when your stomach is empty. If you cannot handle this, take them with food.  Do not drink milk or take antacids at the same time as your iron pills.  To prevent trouble pooping (constipation), eat fiber or take medicine (stool softener) as told by your doctor. Eating and drinking   Talk with your doctor before changing the foods you eat. He or she may tell you to eat foods that have a lot of iron, such as: ? Liver. ? Lowfat (lean) beef. ? Breads and cereals that have iron added to them (fortified breads and cereals). ? Eggs. ? Dried fruit. ? Dark green, leafy vegetables.  Drink enough fluid to keep your pee (urine) clear or pale yellow.  Eat fresh fruits and vegetables that are high in vitamin C. They help your body to use iron. Foods with a lot of vitamin C include: ? Oranges. ? Peppers. ? Tomatoes. ? Mangoes. General instructions  Return to your normal activities as told by your doctor. Ask your doctor what activities are safe for you.  Keep yourself clean, and keep things clean around you (your surroundings). Anemia can make you get sick more easily.  Keep all follow-up visits as told by your doctor. This is important. Contact a doctor if:  You feel sick to your stomach (nauseous).  You throw up (vomit).  You feel  weak.  You are sweating for no clear reason.  You have trouble pooping, such as: ? Pooping (having a bowel movement) less than 3 times a week. ? Straining to poop. ? Having poop that is hard, dry, or larger than normal. ? Feeling full or bloated. ? Pain in the lower belly. ? Not feeling better after pooping. Get help right away if:  You pass out (faint). If this happens, do not drive yourself to the hospital. Call your local emergency services (911 in the [place]  You have chest pain.  You have shortness of breath that: ? Is very bad. ? Gets worse with physical activity.  You have a fast heartbeat.  You get light-headed when getting up from sitting or lying down. This information is not intended to replace advice given to you by your health care provider. Make sure you discuss any questions you have with your health care provider. Document Released: 05/03/2010 Document Revised: 03/13/2017 Document Reviewed: 12/19/2015 Elsevier Patient Education  [place].     Iron-Rich Diet  Iron is a mineral that helps your body to produce hemoglobin. Hemoglobin is a protein in red blood cells that carries oxygen to your body's tissues. Eating too little iron may cause you to feel weak and tired, and it can increase your risk of infection. Iron is naturally found in many foods, and many foods have iron added to them (iron-fortified foods). You may need to follow an iron-rich diet if you  do not have enough iron in your body due to certain medical conditions. The amount of iron that you need each day depends on your age, your sex, and any medical conditions you have. Follow instructions from your health care provider or a diet and nutrition specialist (dietitian) about how much iron you should eat each day. What are tips for following this plan? Reading food labels  Check food labels to see how many milligrams (mg) of iron are in each serving. Cooking  Cook foods in pots and pans that  are made from iron.  Take these steps to make it easier for your body to absorb iron from certain foods: ? Soak beans overnight before cooking. ? Soak whole grains overnight and drain them before using. ? Ferment flours before baking, such as by using yeast in bread dough. Meal planning  When you eat foods that contain iron, you should eat them with foods that are high in vitamin C. These include oranges, peppers, tomatoes, potatoes, and mango. Vitamin C helps your body to absorb iron. General information  Take iron supplements only as told by your health care provider. An overdose of iron can be life-threatening. If you were prescribed iron supplements, take them with orange juice or a vitamin C supplement.  When you eat iron-fortified foods or take an iron supplement, you should also eat foods that naturally contain iron, such as meat, poultry, and fish. Eating naturally iron-rich foods helps your body to absorb the iron that is added to other foods or contained in a supplement.  Certain foods and drinks prevent your body from absorbing iron properly. Avoid eating these foods in the same meal as iron-rich foods or with iron supplements. These foods include: ? Coffee, black tea, and red wine. ? Milk, dairy products, and foods that are high in calcium. ? Beans and soybeans. ? Whole grains. What foods should I eat? Fruits Prunes. Raisins. Eat fruits high in vitamin C, such as oranges, grapefruits, and strawberries, alongside iron-rich foods. Vegetables Spinach (cooked). Green peas. Broccoli. Fermented vegetables. Eat vegetables high in vitamin C, such as leafy greens, potatoes, bell peppers, and tomatoes, alongside iron-rich foods. Grains Iron-fortified breakfast cereal. Iron-fortified whole-wheat bread. Enriched rice. Sprouted grains. Meats and other proteins Beef liver. Oysters. Beef. Shrimp. [place]. Chicken. [place]. Sardines. Chickpeas. Nuts. Tofu. Pumpkin seeds. Beverages Tomato  juice. Fresh orange juice. Prune juice. Hibiscus tea. Fortified instant breakfast shakes. Sweets and desserts Blackstrap molasses. Seasonings and condiments Tahini. Fermented soy sauce. Other foods Wheat germ. The items listed above may not be a complete list of recommended foods and beverages. Contact a dietitian for more information. What foods should I avoid? Grains Whole grains. Bran cereal. Bran flour. Oats. Meats and other proteins Soybeans. Products made from soy protein. Black beans. Lentils. Mung beans. Split peas. Dairy Milk. Cream. Cheese. Yogurt. Cottage cheese. Beverages Coffee. Black tea. Red wine. Sweets and desserts Cocoa. Chocolate. Ice cream. Other foods Basil. Oregano. Large amounts of parsley. The items listed above may not be a complete list of foods and beverages to avoid. Contact a dietitian for more information. Summary  Iron is a mineral that helps your body to produce hemoglobin. Hemoglobin is a protein in red blood cells that carries oxygen to your body's tissues.  Iron is naturally found in many foods, and many foods have iron added to them (iron-fortified foods).  When you eat foods that contain iron, you should eat them with foods that are high in vitamin C. Vitamin C helps  your body to absorb iron.  Certain foods and drinks prevent your body from absorbing iron properly, such as whole grains and dairy products. You should avoid eating these foods in the same meal as iron-rich foods or with iron supplements. This information is not intended to replace advice given to you by your health care provider. Make sure you discuss any questions you have with your health care provider. Document Released: 11/12/2004 Document Revised: 03/13/2017 Document Reviewed: 02/24/2017 Elsevier Patient Education  [employer] [place] for Polycystic Ovary Syndrome Polycystic ovary syndrome (PCOS) is a disorder of the chemicals (hormones) that regulate a woman's  reproductive system, including monthly periods (menstruation). The condition causes important hormones to be out of balance. PCOS can:  Stop your periods or make them irregular.  Cause cysts to develop on your ovaries.  Make it difficult to get pregnant.  Stop your body from responding to the effects of insulin (insulin resistance). Insulin resistance can lead to obesity and diabetes. Changing what you eat can help you manage PCOS and improve your health. Following a balanced diet can help you lose weight and improve the way that your body uses insulin. What are tips for following this plan?  Follow a balanced diet for meals and snacks. Eat breakfast, lunch, dinner, and one or two snacks every day.  Include protein in each meal and snack.  Choose whole grains instead of products that are made with refined flour.  Eat a variety of foods.  Exercise regularly as told by your health care provider. Aim to do 30 or more minutes of exercise on most days of the week.  If you are overweight or obese: ? Pay attention to how many calories you eat. Cutting down on calories can help you lose weight. ? Work with your health care provider or a diet and nutrition specialist (dietitian) to figure out how many calories you need each day. What foods can I eat?  Fruits Include a variety of colors and types. All fruits are helpful for PCOS. Vegetables Include a variety of colors and types. All vegetables are helpful for PCOS. Grains Whole grains, such as whole wheat. Whole-grain breads, crackers, cereals, and pasta. Unsweetened oatmeal, bulgur, barley, quinoa, and brown rice. Tortillas made from corn or whole-wheat flour. Meats and other proteins Low-fat (lean) proteins, such as fish, chicken, beans, eggs, and tofu. Dairy Low-fat dairy products, such as skim milk, cheese sticks, and yogurt. Beverages Low-fat or fat-free drinks, such as water, low-fat milk, sugar-free drinks, and small amounts of  100% fruit juice. Seasonings and condiments Ketchup. Mustard. Barbecue sauce. Relish. Low-fat or fat-free mayonnaise. Fats and oils Olive oil or canola oil. Walnuts and almonds. The items listed above may not be a complete list of recommended foods and beverages. Contact a dietitian for more options. What foods are not recommended? Foods that are high in calories or fat. Fried foods. Sweets. Products that are made from refined white flour, including white bread, pastries, white rice, and pasta. The items listed above may not be a complete list of foods and beverages to avoid. Contact a dietitian for more information. Summary  PCOS is a hormonal imbalance that affects a woman's reproductive system.  You can help to manage your PCOS by exercising regularly and eating a healthy, varied diet of vegetables, fruit, whole grains, low-fat (lean) protein, and low-fat dairy products.  Changing what you eat can improve the way that your body uses insulin, help your hormones reach normal  levels, and help you lose weight. This information is not intended to replace advice given to you by your health care provider. Make sure you discuss any questions you have with your health care provider. Document Released: 07/23/2015 Document Revised: 07/21/2018 Document Reviewed: 02/02/2017 Elsevier Patient Education  [place].     Polycystic Ovarian Syndrome  Polycystic ovarian syndrome (PCOS) is a common hormonal disorder among women of reproductive age. In most women with PCOS, many small fluid-filled sacs (cysts) grow on the ovaries, and the cysts are not part of a normal menstrual cycle. PCOS can cause problems with your menstrual periods and make it difficult to get pregnant. It can also cause an increased risk of miscarriage with pregnancy. If it is not treated, PCOS can lead to serious health problems, such as diabetes and heart disease. What are the causes? The cause of PCOS is not known, but it  may be the result of a combination of certain factors, such as:  Irregular menstrual cycle.  High levels of certain hormones (androgens).  Problems with the hormone that helps to control blood sugar (insulin resistance).  Certain genes. What increases the risk? This condition is more likely to develop in women who have a family history of PCOS. What are the signs or symptoms? Symptoms of PCOS may include:  Multiple ovarian cysts.  Infrequent periods or no periods.  Periods that are too frequent or too heavy.  Unpredictable periods.  Inability to get pregnant (infertility) because of not ovulating.  Increased growth of hair on the face, chest, stomach, back, thumbs, thighs, or toes.  Acne or oily skin. Acne may develop during adulthood, and it may not respond to treatment.  Pelvic pain.  Weight gain or obesity.  Patches of thickened and dark brown or black skin on the neck, arms, breasts, or thighs (acanthosis nigricans).  Excess hair growth on the face, chest, abdomen, or upper thighs (hirsutism). How is this diagnosed? This condition is diagnosed based on:  Your medical history.  A physical exam, including a pelvic exam. Your health care provider may look for areas of increased hair growth on your skin.  Tests, such as: ? Ultrasound. This may be used to examine the ovaries and the lining of the uterus (endometrium) for cysts. ? Blood tests. These may be used to check levels of sugar (glucose), female hormone (testosterone), and female hormones (estrogen and progesterone) in your blood. How is this treated? There is no cure for PCOS, but treatment can help to manage symptoms and prevent more health problems from developing. Treatment varies depending on:  Your symptoms.  Whether you want to have a baby or whether you need birth control (contraception). Treatment may include nutrition and lifestyle changes along with:  Progesterone hormone to start a menstrual  period.  Birth control pills to help you have regular menstrual periods.  Medicines to make you ovulate, if you want to get pregnant.  Medicine to reduce excessive hair growth.  Surgery, in severe cases. This may involve making small holes in one or both of your ovaries. This decreases the amount of testosterone that your body produces. Follow these instructions at home:  Take over-the-counter and prescription medicines only as told by your health care provider.  Follow a healthy meal plan. This can help you reduce the effects of PCOS. ? Eat a healthy diet that includes lean proteins, complex carbohydrates, fresh fruits and vegetables, low-fat dairy products, and healthy fats. Make sure to eat enough fiber.  If  you are overweight, lose weight as told by your health care provider. ? Losing 10% of your body weight may improve symptoms. ? Your health care provider can determine how much weight loss is best for you and can help you lose weight safely.  Keep all follow-up visits as told by your health care provider. This is important. Contact a health care provider if:  Your symptoms do not get better with medicine.  You develop new symptoms. This information is not intended to replace advice given to you by your health care provider. Make sure you discuss any questions you have with your health care provider. Document Released: 07/25/2004 Document Revised: 03/13/2017 Document Reviewed: 09/16/2015 Elsevier Patient Education  [place].   Iron Deficiency Anemia, Adult Iron deficiency anemia is a condition in which the concentration of red blood cells or hemoglobin in the blood is below normal because of too little iron. Hemoglobin is a substance in red blood cells that carries oxygen to the body's tissues. When the concentration of red blood cells or hemoglobin is too low, not enough oxygen reaches these tissues. Iron deficiency anemia is usually long-lasting (chronic) and it  develops over time. It may or may not cause symptoms. It is a common type of anemia. What are the causes? This condition may be caused by:  Not enough iron in the diet.  Blood loss caused by bleeding in the intestine.  Blood loss from a gastrointestinal condition like Crohn disease.  Frequent blood draws, such as from blood donation.  Abnormal absorption in the gut.  Heavy menstrual periods in women.  Cancers of the gastrointestinal system, such as colon cancer. What are the signs or symptoms? Symptoms of this condition may include:  Fatigue.  Headache.  Pale skin, lips, and nail beds.  Poor appetite.  Weakness.  Shortness of breath.  Dizziness.  Cold hands and feet.  Fast or irregular heartbeat.  Irritability. This is more common in severe anemia.  Rapid breathing. This is more common in severe anemia. Mild anemia may not cause any symptoms. How is this diagnosed? This condition is diagnosed based on:  Your medical history.  A physical exam.  Blood tests. You may have additional tests to find the underlying cause of your anemia, such as:  Testing for blood in the stool (fecal occult blood test).  A procedure to see inside your colon and rectum (colonoscopy).  A procedure to see inside your esophagus and stomach (endoscopy).  A test in which cells are removed from bone marrow (bone marrow aspiration) or fluid is removed from the bone marrow to be examined (biopsy). This is rarely needed. How is this treated? This condition is treated by correcting the cause of your iron deficiency. Treatment may involve:  Adding iron-rich foods to your diet.  Taking iron supplements. If you are pregnant or breastfeeding, you may need to take extra iron because your normal diet usually does not provide the amount of iron that you need.  Increasing vitamin C intake. Vitamin C helps your body absorb iron. Your health care provider may recommend that you take iron  supplements along with a glass of orange juice or a vitamin C supplement.  Medicines to make heavy menstrual flow lighter.  Surgery. You may need repeat blood tests to determine whether treatment is working. Depending on the underlying cause, the anemia should be corrected within 2 months of starting treatment. If the treatment does not seem to be working, you may need more testing. Follow  these instructions at home: Medicines  Take over-the-counter and prescription medicines only as told by your health care provider. This includes iron supplements and vitamins.  If you cannot tolerate taking iron supplements by mouth, talk with your health care provider about taking them through a vein (intravenously) or an injection into a muscle.  For the best iron absorption, you should take iron supplements when your stomach is empty. If you cannot tolerate them on an empty stomach, you may need to take them with food.  Do not drink milk or take antacids at the same time as your iron supplements. Milk and antacids may interfere with iron absorption.  Iron supplements can cause constipation. To prevent constipation, include fiber in your diet as told by your health care provider. A stool softener may also be recommended. Eating and drinking   Talk with your health care provider before changing your diet. He or she may recommend that you eat foods that contain a lot of iron, such as: ? Liver. ? Low-fat (lean) beef. ? Breads and cereals that have iron added to them (are fortified). ? Eggs. ? Dried fruit. ? Dark green, leafy vegetables.  To help your body use the iron from iron-rich foods, eat those foods at the same time as fresh fruits and vegetables that are high in vitamin C. Foods that are high in vitamin C include: ? Oranges. ? Peppers. ? Tomatoes. ? Mangoes.  Drinkenoughfluid to keep your urine clear or pale yellow. General instructions  Return to your normal activities as told by your  health care provider. Ask your health care provider what activities are safe for you.  Practice good hygiene. Anemia can make you more prone to illness and infection.  Keep all follow-up visits as told by your health care provider. This is important. Contact a health care provider if:  You feel nauseous or you vomit.  You feel weak.  You have unexplained sweating.  You develop symptoms of constipation, such as: ? Having fewer than three bowel movements a week. ? Straining to have a bowel movement. ? Having stools that are hard, dry, or larger than normal. ? Feeling full or bloated. ? Pain in the lower abdomen. ? Not feeling relief after having a bowel movement. Get help right away if:  You faint. If this happens, do not drive yourself to the hospital. Call your local emergency services (911 in the [place]  You have chest pain.  You have shortness of breath that: ? Is severe. ? Gets worse with physical activity.  You have a rapid heartbeat.  You become light-headed when getting up from a sitting or lying down position. This information is not intended to replace advice given to you by your health care provider. Make sure you discuss any questions you have with your health care provider. Document Released: 03/28/2000 Document Revised: 03/13/2017 Document Reviewed: 12/19/2015 Elsevier Patient Education  [place].    Iron-Rich Diet  Iron is a mineral that helps your body to produce hemoglobin. Hemoglobin is a protein in red blood cells that carries oxygen to your body's tissues. Eating too little iron may cause you to feel weak and tired, and it can increase your risk of infection. Iron is naturally found in many foods, and many foods have iron added to them (iron-fortified foods). You may need to follow an iron-rich diet if you do not have enough iron in your body due to certain medical conditions. The amount of iron that you need each  day depends on your age, your  sex, and any medical conditions you have. Follow instructions from your health care provider or a diet and nutrition specialist (dietitian) about how much iron you should eat each day. What are tips for following this plan? Reading food labels  Check food labels to see how many milligrams (mg) of iron are in each serving. Cooking  Cook foods in pots and pans that are made from iron.  Take these steps to make it easier for your body to absorb iron from certain foods: ? Soak beans overnight before cooking. ? Soak whole grains overnight and drain them before using. ? Ferment flours before baking, such as by using yeast in bread dough. Meal planning  When you eat foods that contain iron, you should eat them with foods that are high in vitamin C. These include oranges, peppers, tomatoes, potatoes, and mango. Vitamin C helps your body to absorb iron. General information  Take iron supplements only as told by your health care provider. An overdose of iron can be life-threatening. If you were prescribed iron supplements, take them with orange juice or a vitamin C supplement.  When you eat iron-fortified foods or take an iron supplement, you should also eat foods that naturally contain iron, such as meat, poultry, and fish. Eating naturally iron-rich foods helps your body to absorb the iron that is added to other foods or contained in a supplement.  Certain foods and drinks prevent your body from absorbing iron properly. Avoid eating these foods in the same meal as iron-rich foods or with iron supplements. These foods include: ? Coffee, black tea, and red wine. ? Milk, dairy products, and foods that are high in calcium. ? Beans and soybeans. ? Whole grains. What foods should I eat? Fruits Prunes. Raisins. Eat fruits high in vitamin C, such as oranges, grapefruits, and strawberries, alongside iron-rich foods. Vegetables Spinach (cooked). Green peas. Broccoli. Fermented vegetables. Eat  vegetables high in vitamin C, such as leafy greens, potatoes, bell peppers, and tomatoes, alongside iron-rich foods. Grains Iron-fortified breakfast cereal. Iron-fortified whole-wheat bread. Enriched rice. Sprouted grains. Meats and other proteins Beef liver. Oysters. Beef. Shrimp. [place]. Chicken. [place]. Sardines. Chickpeas. Nuts. Tofu. Pumpkin seeds. Beverages Tomato juice. Fresh orange juice. Prune juice. Hibiscus tea. Fortified instant breakfast shakes. Sweets and desserts Blackstrap molasses. Seasonings and condiments Tahini. Fermented soy sauce. Other foods Wheat germ. The items listed above may not be a complete list of recommended foods and beverages. Contact a dietitian for more information. What foods should I avoid? Grains Whole grains. Bran cereal. Bran flour. Oats. Meats and other proteins Soybeans. Products made from soy protein. Black beans. Lentils. Mung beans. Split peas. Dairy Milk. Cream. Cheese. Yogurt. Cottage cheese. Beverages Coffee. Black tea. Red wine. Sweets and desserts Cocoa. Chocolate. Ice cream. Other foods Basil. Oregano. Large amounts of parsley. The items listed above may not be a complete list of foods and beverages to avoid. Contact a dietitian for more information. Summary  Iron is a mineral that helps your body to produce hemoglobin. Hemoglobin is a protein in red blood cells that carries oxygen to your body's tissues.  Iron is naturally found in many foods, and many foods have iron added to them (iron-fortified foods).  When you eat foods that contain iron, you should eat them with foods that are high in vitamin C. Vitamin C helps your body to absorb iron.  Certain foods and drinks prevent your body from absorbing iron properly, such as whole grains  and dairy products. You should avoid eating these foods in the same meal as iron-rich foods or with iron supplements. This information is not intended to replace advice given to you by your health  care provider. Make sure you discuss any questions you have with your health care provider. Document Released: 11/12/2004 Document Revised: 03/13/2017 Document Reviewed: 02/24/2017 Elsevier Patient Education  2020 [employer].

## 2019-01-19 ENCOUNTER — Encounter: Payer: Self-pay | Admitting: Obstetrics and Gynecology

## 2019-02-01 ENCOUNTER — Other Ambulatory Visit: Payer: Self-pay

## 2019-02-01 ENCOUNTER — Ambulatory Visit (INDEPENDENT_AMBULATORY_CARE_PROVIDER_SITE_OTHER): Payer: BC Managed Care – PPO

## 2019-02-01 DIAGNOSIS — Z86018 Personal history of other benign neoplasm: Secondary | ICD-10-CM | POA: Diagnosis not present

## 2019-02-15 ENCOUNTER — Encounter: Payer: Self-pay | Admitting: Family Medicine

## 2019-02-15 ENCOUNTER — Ambulatory Visit (INDEPENDENT_AMBULATORY_CARE_PROVIDER_SITE_OTHER): Payer: BC Managed Care – PPO | Admitting: Family Medicine

## 2019-02-15 ENCOUNTER — Other Ambulatory Visit: Payer: Self-pay

## 2019-02-15 VITALS — HR 86 | Ht 63.0 in | Wt 286.0 lb

## 2019-02-15 DIAGNOSIS — R591 Generalized enlarged lymph nodes: Secondary | ICD-10-CM | POA: Diagnosis not present

## 2019-02-15 NOTE — Progress Notes (Signed)
Name: Sherri Robertson   MRN: [id number]    DOB: 1985/12/18   Date:02/15/2019       Progress Note  Subjective:    Chief Complaint  Chief Complaint  Patient presents with  . Back Pain    middle, no trauma  . Clavicle Injury    no injury just painful, pt states has a knot on left side.  She states all this started hurting once she received her flu shot? but no arm pain    I connected with  Sherri Robertson  on 02/15/19 at  9:40 AM EST by a video enabled telemedicine application and verified that I am speaking with the correct person using two identifiers.  I discussed the limitations of evaluation and management by telemedicine and the availability of in person appointments. The patient expressed understanding and agreed to proceed. Staff also discussed with the patient that there may be a patient responsible charge related to this service. Patient Location: home Provider Location: [facility]nic Additional Individuals present: none  HPI Patient presents via virtual encounter with concern of a tender nodule located on her left upper chest lower neck just above her left clavicle.  She recently had her flu shot and then her symptoms began shortly afterwards over the last week.  She does not feel any soreness in her shoulder where she had the immunization she had no body aches, sweats, fever, chills.  She denies any swelling of the skin or redness.  She has no bony tenderness when she presses on her clavicle she has no joint pain in her shoulder when she moves it around.  She further denies any URI symptoms, denies allergies, sinus congestion, postnasal drip, sore throat, headache or cervical lymphadenopathy.  She also denies any cough, wheeze, shortness of breath, chest pain, night sweats, unintentional weight loss, rash.  She overall feels well and does not hurt unless she is pressing on it.  She denies fatigue.  Patient Active Problem List   Diagnosis Date Noted  . Migraine without aura and  without status migrainosus, not intractable 12/01/2018  . Gastroesophageal reflux disease 12/01/2018  . Prediabetes 02/05/2017  . 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 Thyroid nodule 01/22/2017  . Vitamin D deficiency 01/22/2017  . Acanthosis nigricans 08/05/2015  . Anxiety and depression 01/26/2015  . Dysmenorrhea 01/26/2015  . Fibroid 01/26/2015  . Anemia, iron deficiency 09/21/2009  . Obesity, morbid, BMI 50 or higher ([place]) 07/27/2009    Social History   Tobacco Use  . Smoking status: Never Smoker  . Smokeless tobacco: Never Used  Substance Use Topics  . Alcohol use: Yes    Comment: occassional - 3 glasses wine/month     Current Outpatient Medications:  .  bismuth subsalicylate (PEPTO BISMOL) 262 MG/15ML suspension, Take 30 mLs by mouth every 6 (six) hours as needed., Disp: , Rfl:  .  ferrous sulfate 325 (65 FE) MG EC tablet, Take 325 mg by mouth 2 (two) times daily., Disp: , Rfl:  .  omeprazole (PRILOSEC) 20 MG capsule, TAKE 1 CAPSULE BY MOUTH EVERY DAY, Disp: 90 capsule, Rfl: 3 .  SUMAtriptan (IMITREX) 100 MG tablet, Take 1 tablet earliest onset of migraine.  May repeat in 2 hours if headache persists or recurs.  Maximum 2 tablets in 24 hours, Disp: 10 tablet, Rfl: 2 .  topiramate (TOPAMAX) 50 MG tablet, Take 1 tablet (50 mg total) by mouth at bedtime., Disp: 30 tablet, Rfl: 3 .  Vitamin D, Ergocalciferol, (DRISDOL) 1.25 MG (  50000 UT) CAPS capsule, Take 1 capsule (50,000 Units total) by mouth every 7 (seven) days. x12 weeks., Disp: 12 capsule, Rfl: 0  No Known Allergies  I personally reviewed active problem list, medication list, allergies, health maintenance, notes from last encounter, lab results with the patient/caregiver today.  Review of Systems  Constitutional: Negative.   HENT: Negative.   Eyes: Negative.   Respiratory: Negative.   Cardiovascular: Negative.   Gastrointestinal: Negative.   Endocrine: Negative.   Genitourinary: Negative.   Musculoskeletal: Negative.    Skin: Negative.   Allergic/Immunologic: Negative.   Neurological: Negative.   Hematological: Negative.   Psychiatric/Behavioral: Negative.   All other systems reviewed and are negative.    Objective:   Virtual encounter, vitals limited, only able to obtain the following Today's Vitals   02/15/19 0939 02/15/19 0941  Pulse: 86   Weight: 286 lb (129.7 kg)   Height: 5\' 3"  (1.6 m)   PainSc:  6    Body mass index is 50.66 kg/m. Nursing Note and Vital Signs reviewed.  Physical Exam Vitals signs and nursing note reviewed.  Constitutional:      General: She is not in acute distress.    Appearance: She is well-developed. She is obese. She is not ill-appearing, toxic-appearing or diaphoretic.  HENT:     Head: Normocephalic and atraumatic.     Nose: Nose normal.  Eyes:     General:        Right eye: No discharge.        Left eye: No discharge.     Conjunctiva/sclera: Conjunctivae normal.  Neck:     Trachea: No tracheal deviation.  Pulmonary:     Effort: Pulmonary effort is normal. No tachypnea, accessory muscle usage or respiratory distress.     Breath sounds: No stridor.  Chest:     Comments: Reported left supraclavicular lymphadenopathy she checked axillary and pectoral denied any nodules bumps or tenderness Musculoskeletal: Normal range of motion.     Left shoulder: She exhibits normal range of motion, no tenderness, no bony tenderness and no swelling.     Comments: Patient was able to show me her left shoulder and clavicle she had normal range of motion of her shoulder with self palpation of her bones and muscles she denied any tenderness, no visible swelling or erythema  Lymphadenopathy:     Upper Body:     Left upper body: Supraclavicular adenopathy present. No axillary or pectoral adenopathy.  Skin:    General: Skin is warm and dry.     Findings: No rash.  Neurological:     Mental Status: She is alert.     Motor: No abnormal muscle tone.     Coordination:  Coordination normal.  Psychiatric:        Mood and Affect: Mood normal.        Behavior: Behavior normal.     PE limited by telephone encounter  No results found for this or any previous visit (from the past 72 hour(s)).  Assessment and Plan:     ICD-10-CM   1. Lymphadenopathy  R59.1    left low cervical or supraclavicular nodule, ttp, after flu shot in left arm, no other sx, may be reactive lymph node.  Plan will be to monitor for 2 to 4 weeks would expect it to resolve on its own. My concern is the location, patient denies any chest pain, cough, shortness of breath, fever, night sweats, chills, rash, unintentional weight loss.  She has no URI  symptoms either. Our plan will be to have her follow-up in 2 weeks to come in in person so I can do a physical exam make sure there is nothing that needs be followed up with x-rays or ultrasounds.  On the virtual encounter today I could not visualize what she was palpating there is no asymmetry patient had no shortness of breath or cough she was very well-appearing, I had her check her lymph nodes in her axilla and cervical she did not feel anything else and she denies any breast changes. Most likely its a reactive lymph node from recent immunization - but will monitor      -Red flags and when to present for emergency care or RTC including fever >101.42F, chest pain, shortness of breath, new/worsening/un-resolving symptoms-  reviewed with patient at time of visit. Follow up and care instructions discussed and provided in AVS. - I discussed the assessment and treatment plan with the patient. The patient was provided an opportunity to ask questions and all were answered. The patient agreed with the plan and demonstrated an understanding of the instructions.  I provided 12 minutes of non-face-to-face time during this encounter.  Delsa Grana, PA-C 02/15/19 9:54 AM

## 2019-02-16 ENCOUNTER — Other Ambulatory Visit: Payer: Self-pay | Admitting: Family Medicine

## 2019-02-16 NOTE — Telephone Encounter (Signed)
Tried to call °

## 2019-02-16 NOTE — Telephone Encounter (Signed)
Pt returned office call, pt says that she is having Back and left side pain. Pt says that the medication gives her relief.

## 2019-02-16 NOTE — Telephone Encounter (Signed)
Requested medication (s) are due for refill today: no  Requested medication (s) are on the active medication list: no  Last refill:  07/28/2017  Future visit scheduled: yes  Notes to clinic: medication was discontinued    Requested Prescriptions  Pending Prescriptions Disp Refills   cyclobenzaprine (FLEXERIL) 5 MG tablet [Pharmacy Med Name: CYCLOBENZAPRINE 5 MG TABLET] 30 tablet 0    Sig: Take 1 tablet (5 mg total) by mouth every 8 (eight) hours as needed for muscle spasms.     Not Delegated - Analgesics:  Muscle Relaxants Failed - 02/16/2019  8:46 AM      Failed - This refill cannot be delegated      Passed - Valid encounter within last 6 months    Recent Outpatient Visits          Yesterday Lymphadenopathy   [facility] Delsa Grana, PA-C   2 months ago Migraine without aura and without status migrainosus, not intractable   [place], NP   3 months ago Intractable migraine without aura and without status migrainosus   [place], NP   1 year ago Upper respiratory tract infection, unspecified type   [place], FNP   1 year ago Other headache syndrome   [place], [place], NP      Future Appointments            In 2 weeks Delsa Grana, PA-C [facility], [place]   In [place], [facility]

## 2019-02-22 ENCOUNTER — Other Ambulatory Visit: Payer: Self-pay | Admitting: Neurology

## 2019-03-04 ENCOUNTER — Encounter: Payer: Self-pay | Admitting: Family Medicine

## 2019-03-04 ENCOUNTER — Ambulatory Visit (INDEPENDENT_AMBULATORY_CARE_PROVIDER_SITE_OTHER): Payer: BC Managed Care – PPO | Admitting: Family Medicine

## 2019-03-04 ENCOUNTER — Other Ambulatory Visit: Payer: Self-pay

## 2019-03-04 DIAGNOSIS — E559 Vitamin D deficiency, unspecified: Secondary | ICD-10-CM

## 2019-03-04 DIAGNOSIS — K219 Gastro-esophageal reflux disease without esophagitis: Secondary | ICD-10-CM | POA: Diagnosis not present

## 2019-03-04 DIAGNOSIS — D509 Iron deficiency anemia, unspecified: Secondary | ICD-10-CM

## 2019-03-04 DIAGNOSIS — R7303 Prediabetes: Secondary | ICD-10-CM

## 2019-03-04 MED ORDER — IRON-FOLIC ACID-C-B6-B12-ZINC 150-1.25 MG PO TABS: 1.0000 | ORAL_TABLET | Freq: Every day | ORAL | 1 refills | Status: AC

## 2019-03-04 MED ORDER — PANTOPRAZOLE SODIUM 40 MG PO TBEC: 40.0000 mg | DELAYED_RELEASE_TABLET | Freq: Every day | ORAL | 3 refills | Status: DC

## 2019-03-04 MED ORDER — VITAMIN D (ERGOCALCIFEROL) 1.25 MG (50000 UNIT) PO CAPS: 50000.0000 [IU] | ORAL_CAPSULE | ORAL | 0 refills | Status: DC

## 2019-03-04 NOTE — Progress Notes (Signed)
Name: Sherri Robertson   MRN: [id number]    DOB: 1986/01/08   Date:03/04/2019       Progress Note  Subjective:    Chief Complaint  Chief Complaint  Patient presents with   Medication Refill    3 month F/U   Migraine    Having one to two weekly-seeing neurology for migraines   Gastroesophageal Reflux   Microcytic anemia   Vit d deficiency    I connected with  DANILEE LEVIE  on 03/04/19 at  3:40 PM EST by a video enabled telemedicine application and verified that I am speaking with the correct person using two identifiers.  I discussed the limitations of evaluation and management by telemedicine and the availability of in person appointments. The patient expressed understanding and agreed to proceed. Staff also discussed with the patient that there may be a patient responsible charge related to this service. Patient Location: in her car Provider Location: [facility] Additional Individuals present: her children in the back seat - parked   HPI   Patient presents via virtual encounter for follow-up on recent acute and chronic conditions after recently reestablishing care here about three months ago, August 2020  Anemia: Patient has history of chronic iron deficiency anemia/microcytic anemia, extensive work-up in the past, recently referred back to OB/GYN.  Will copy note below from August 2020, my initial visit with her  Microcytic anemia  - on iron supplement 2x a day, some GI upset with it, but she feels awful when not taking iron, so she manages the abdominal discomfort.  Still having very heavy periods.  Hx of blood transfusions, fibroids, dysmenorrhea and now menorrhagia.  Hasn't seen OBGYN in a while.  Off and on OCPs to help manage cycles, hasn't been on for a few months.   She is tired, gets SOB easily, has PICA.  Denies CP, near syncope, paresthesias, palpitations, cold intolerance.   Mom and sister with hx of anemia, no known family history of coagulopathy. Per  chart review patient has had iron panel done several times in the past, has had electrophoresis which was negative -did see hematology in 2018, and was seen previously by Dr. Getting.  Per hematology, Dr. Mike Gip and Honor Loh NP, patient has chronic anemia, hemoglobin ranging 8.2 - 10.5, with persistent microcytic indices with MCV 62.1-67.0.  Last lab work done here and available in EMR was from April 2019 her hemoglobin had improved to 11.3 MCV of 71.7 and RDW was still elevated.  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  Also had past med hx of asymptomatic ulcer at age 9.  She states she has been taking iron supplement BID with SE of nausea, she denies constipation - see below She has been back to GYN for history of irregular cycles, heavy periods and history of fibroids- she is not going on any OCP's, periods have been fairly regular, only lasting around 5 days, and ultrasound was normal, she did not have uterine fibroids. Last year she was referred to gastroenterology she saw Dr. Allen Norris, and he did arrange for a EGD and colonoscopy however the case was canceled by anesthesia due to elevated BMI. (July 2019).  Prior to referral to GI she did have 3 negative stool cards - so seems she has never been able to complete GI eval/procedures.  GERD: Pt reports her "stomach is acting up" she is using prilosec PRN and then taking some other chewable tablet most days -  she thinks its pepcid chewable she is "chewing it all day long"   She has some nausea, persistent nauseauous feeling, feels sick on her stomach.  Vit D deficiency:  Chronic No dairy in diet as well Was on Rx strength vit D supplement  Headaches/migraines: Patient states that she is continuing to see neurology she has been instructed to stay off all ibuprofen Tylenol and other over-the-counter medications and only use prescriptions from neurology for her headaches.  She reports 2 different  types of quality of headache that she experiences.  When she starts her first 12-hour night shift and comes home and is with her children all day getting very minimal sleep she has a diffuse generalized throbbing headache usually associated with some mild myalgias or scratchy throat she feels like she is slightly sick but she knows it is just because of lack of sleep.  She has been trying to get through them without any over-the-counter medications but she really wants to try and use Tylenol to get her through the day, she usually gets better after working her next night shift and sleeping.  She states very rarely does trigger her migraines which are unilateral much more severe with photophobia phonophobia nausea.  Medications from neurology do get rid of the migraines however when she is in between shifts she cannot take it because she cannot fall asleep while taking care of her children.    Patient Active Problem List   Diagnosis Date Noted   Migraine without aura and without status migrainosus, not intractable 12/01/2018   Gastroesophageal reflux disease 12/01/2018   Prediabetes 02/05/2017   Lipoma of back 02/05/2017   Thyroid nodule 01/22/2017   Vitamin D deficiency 01/22/2017   Acanthosis nigricans 08/05/2015   Anxiety and depression 01/26/2015   Dysmenorrhea 01/26/2015   Fibroid 01/26/2015   Anemia, iron deficiency 09/21/2009   Obesity, morbid, BMI 50 or higher ([place]) 07/27/2009    Past Surgical History:  Procedure Laterality Date   CESAREAN SECTION     TONSILLECTOMY      Family History  Problem Relation Age of Onset   Hypertension Mother    Depression Mother    Hyperlipidemia Mother    Depression Sister    Anxiety disorder Sister    Anxiety disorder Brother    Eczema Daughter    COPD Maternal Grandmother    Emphysema Maternal Grandmother    Cancer Maternal Grandfather        stomach cancer    Social History   Socioeconomic History   Marital  status: Single    Spouse name: Not on file   Number of children: 1   Years of education: Not on file   Highest education level: Some college, no degree  Occupational History   Occupation: Clinical support Engineer, production: [employer]  Social Needs   Financial resource strain: Not on file   Food insecurity    Worry: Not on file    Inability: Not on file   Transportation needs    Medical: Not on file    Non-medical: Not on file  Tobacco Use   Smoking status: Never Smoker   Smokeless tobacco: Never Used  Substance and Sexual Activity   Alcohol use: Yes    Comment: occassional - 3 glasses wine/month   Drug use: No   Sexual activity: Yes    Birth control/protection: None  Lifestyle   Physical activity    Days per week: Not on file    Minutes per  session: Not on file   Stress: Not on file  Relationships   Social connections    Talks on phone: Not on file    Gets together: Not on file    Attends religious service: Not on file    Active member of club or organization: Not on file    Attends meetings of clubs or organizations: Not on file    Relationship status: Not on file   Intimate partner violence    Fear of current or ex partner: Not on file    Emotionally abused: Not on file    Physically abused: Not on file    Forced sexual activity: Not on file  Other Topics Concern   Not on file  Social History Narrative   Patient is right-handed. She lives in a two level home. She drinks coffee and tea 2-4 x a week. She does not exercise.     Current Outpatient Medications:    bismuth subsalicylate (PEPTO BISMOL) 262 MG/15ML suspension, Take 30 mLs by mouth every 6 (six) hours as needed., Disp: , Rfl:    cholecalciferol (VITAMIN D3) 25 MCG (1000 UT) tablet, Take 2,000 Units by mouth daily., Disp: , Rfl:    clobetasol ointment (TEMOVATE) 0.05 %, APPLY TO AFFECTED AREAS ON HANDS TWICE DAILY UNTIL CLEAR, THEN AS NEEDED, Disp: , Rfl:    cyclobenzaprine  (FLEXERIL) 5 MG tablet, Take 1 tablet (5 mg total) by mouth 3 (three) times daily as needed for muscle spasms., Disp: 30 tablet, Rfl: 0   ferrous sulfate 325 (65 FE) MG EC tablet, Take 325 mg by mouth 2 (two) times daily., Disp: , Rfl:    omeprazole (PRILOSEC) 20 MG capsule, TAKE 1 CAPSULE BY MOUTH EVERY DAY, Disp: 90 capsule, Rfl: 3   SUMAtriptan (IMITREX) 100 MG tablet, Take 1 tablet earliest onset of migraine.  May repeat in 2 hours if headache persists or recurs.  Maximum 2 tablets in 24 hours, Disp: 10 tablet, Rfl: 2   topiramate (TOPAMAX) 50 MG tablet, TAKE 1 TABLET BY MOUTH EVERYDAY AT BEDTIME, Disp: 90 tablet, Rfl: 1   Vitamin D, Ergocalciferol, (DRISDOL) 1.25 MG (50000 UT) CAPS capsule, Take 1 capsule (50,000 Units total) by mouth every 7 (seven) days. x12 weeks. (Patient not taking: Reported on 03/04/2019), Disp: 12 capsule, Rfl: 0  No Known Allergies  I personally reviewed active problem list, medication list, allergies, family history, social history, health maintenance, notes from last encounter, lab results, imaging with the patient/caregiver today.  Review of Systems  Constitutional: Negative.   HENT: Negative.   Eyes: Negative.   Respiratory: Negative.   Cardiovascular: Negative.   Gastrointestinal: Negative.   Endocrine: Negative.   Genitourinary: Negative.   Musculoskeletal: Negative.   Skin: Negative.   Allergic/Immunologic: Negative.   Neurological: Negative.   Hematological: Negative.   Psychiatric/Behavioral: Negative.   All other systems reviewed and are negative.     Objective:    Virtual encounter, vitals limited, only able to obtain the following There were no vitals filed for this visit. There is no height or weight on file to calculate BMI. Nursing Note and Vital Signs reviewed.  Physical Exam Vitals signs and nursing note reviewed.  Constitutional:      General: She is not in acute distress.    Appearance: She is well-developed. She is  obese. She is not ill-appearing, toxic-appearing or diaphoretic.     Comments: Tired, well appearing  HENT:     Head: Normocephalic and atraumatic.  Nose: Nose normal.  Eyes:     General:        Right eye: No discharge.        Left eye: No discharge.     Conjunctiva/sclera: Conjunctivae normal.  Neck:     Trachea: No tracheal deviation.  Pulmonary:     Effort: Pulmonary effort is normal. No respiratory distress.     Breath sounds: No stridor.  Skin:    Findings: No rash.  Neurological:     Mental Status: She is alert.  Psychiatric:        Mood and Affect: Mood normal.        Behavior: Behavior normal.     PE limited by telephone encounter  No results found for this or any previous visit (from the past 72 hour(s)).  PHQ2/9: Depression screen [facility] 2/9 03/04/2019 02/15/2019 12/01/2018 11/12/2018 01/14/2018  Decreased Interest 0 0 0 0 0  Down, Depressed, Hopeless 0 0 0 0 0  PHQ - 2 Score 0 0 0 0 0  Altered sleeping 0 0 0 0 0  Tired, decreased energy 0 0 0 0 0  Change in appetite 0 0 0 0 0  Feeling bad or failure about yourself  0 0 0 0 0  Trouble concentrating 0 0 0 0 0  Moving slowly or fidgety/restless 0 0 0 0 0  Suicidal thoughts 0 0 0 0 0  PHQ-9 Score 0 0 0 0 -  Difficult doing work/chores Not difficult at all Not difficult at all Not difficult at all Not difficult at all Not difficult at all   PHQ-2/9 Result is negative.  Reviewed today  Fall Risk: Fall Risk  03/04/2019 02/15/2019 12/01/2018 11/29/2018 11/12/2018  Falls in the past year? 0 0 0 0 0  Number falls in past yr: 0 0 0 - 0  Injury with Fall? 0 0 0 - 0  Follow up - - - Falls evaluation completed -     Assessment and Plan:       ICD-10-CM   1. Iron deficiency anemia, unspecified iron deficiency anemia type  D50.9 CBC w/ Diff    Iron, TIBC and Ferritin Panel    [id number]    Iron-Folic [id number] 150-1.25 MG TABS   With GI side effects will decrease iron supplement to 3-4 times a week, will recheck  iron panel and CBC  May need to refer back to hematology   2. Vitamin D deficiency  E55.9 Vitamin D, Ergocalciferol, (DRISDOL) 1.25 MG (50000 UT) CAPS capsule    Vit D   Refill on prescription strength vitamin D supplement, will recheck labs - with persistently low vit D question absorption issue? celiac? other inflammatory bowel disease?   3. Prediabetes  R73.03 CMP w GFR    A1C   with labs recheck A1C   4. Gastroesophageal reflux disease, unspecified whether esophagitis present  K21.9 pantoprazole (PROTONIX) 40 MG tablet   sx severe, but some of her sx may be iron supplement SE and not as much GERD? decrease iron supplement from BID to daily or 3x a week, d/c omeprazole and start daily protonix trial for 2 weeks Did explain for her proper administration of Protonix first in the morning on empty stomach not to eat for 1 hour afterwards, we will move her iron supplement to in the evenings  May need to be seen by GI again - but may need to inquire at different facilities to see who would be able to do EGD/colonoscopy for iron  deficiency anemia work up in pt with BMI >50   5. 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 The patient was provided an opportunity to ask questions and all were answered. The patient agreed with the plan and demonstrated an understanding of the instructions.  The patient was advised to call back or seek an in-person evaluation if the symptoms worsen or if the condition fails to improve as anticipated.  I provided 17 minutes of non-face-to-face time during this encounter.  Delsa Grana, PA-C 11/20/204:00 PM

## 2019-03-16 MED ORDER — NORTRIPTYLINE HCL 10 MG PO CAPS: 10.0000 mg | ORAL_CAPSULE | Freq: Every day | ORAL | 5 refills | Status: DC

## 2019-03-29 NOTE — Progress Notes (Signed)
Virtual Visit via Video Note The purpose of this virtual visit is to provide medical care while limiting exposure to the novel coronavirus.    Consent was obtained for video visit:  Yes.   Answered questions that patient had about telehealth interaction:  Yes.   I discussed the limitations, risks, security and privacy concerns of performing an evaluation and management service by telemedicine. I also discussed with the patient that there may be a patient responsible charge related to this service. The patient expressed understanding and agreed to proceed.  Pt location: Home Physician Location: office Name of referring provider:  Arnetha Courser, MD I connected with SHELLY BEARFIELD at patients initiation/request on 03/31/2019 at  8:30 AM EST by video enabled telemedicine application and verified that I am speaking with the correct person using two identifiers. Pt MRN:  [id number] Pt DOB:  04/06/1986 Video Participants:  Denton Brick   History of Present Illness:  Sherri Robertson is a 33 year old female who follows up for migraines.  UPDATE: Started topiramate but caused irritability and anxiety.  Started nortriptyline 10mg  a week ago..  Rizatriptan ineffective.  Prescribed sumatriptan  Still roughly occurring twice a month.  She had a headache yesterday with some dull lingering headache today.  She takes a sumatriptan and goes to sleep.  When she wakes up, there is a lingering headache.  Current NSAIDS:  none Current analgesics:  none Current triptans:  sumatriptan 100mg  Current ergotamine:  none Current anti-emetic:  none Current muscle relaxants:  none Current anti-anxiolytic:  none Current sleep aide:  none Current Antihypertensive medications:  none Current Antidepressant medications:  nortriptyline 10mg  at bedtime Current Anticonvulsant medications:  none Current anti-CGRP:  none Current Vitamins/Herbal/Supplements:  none Current Antihistamines/Decongestants:   none Other therapy:  none Hormone/birth control:  none  Caffeine:  1 cup of coffee 2 to 3 times a week, no soda Diet:  Started eliminating cheese and milk.  Hydrates. Exercise:  Not routine Depression:  no; Anxiety:  stable Other pain:  no Sleep hygiene:  Good. 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  She would need to sleep it off in dark room with something over her eyes.  They would occur every 3 to 4 months.  However, they have getting more frequent in 2019.  She went to the ED twice last year.   Location:  Behind the eyes radiating to back of head (may be either side) Quality:  Pressure, pounding Initial intensity:  8/10.  She denies new headache, thunderclap headache Aura:  no Premonitory Phase:  no Postdrome:  fatigue Associated symptoms:  Photophobia, phonophobia, occasional nausea.  She denies associated visual disturbance, unilateral numbness or weakness. Initial duration:  1 to 1 1/2 days.  Normally occur when she gets up from sleep.  Rarely occurs at work. Initial frequency:  Twice a month. Initial frequency of abortive medication:  Triggers:  Dairy (cheese, milk), sleep deprivation Relieving factors:  Something cool over her face Activity:  aggravates  She has been to the ED a handful of times over the years due to headache.    CT head from 07/22/13 and 02/20/16 were personally reviewed and were normal.   Past NSAIDS:  naproxen, ibuprofen Past analgesics:  Fioricet, acetaminophen Past abortive triptans:  rizatriptan 10mg  Past abortive ergotamine:  none Past muscle relaxants:  cyclobenzaprine Past anti-emetic:  Reglan Past antihypertensive medications:  none Past antidepressant medications:  none Past anticonvulsant medications:  topiramate 50mg  (anxiety, irritability) Past anti-CGRP:  none Past vitamins/Herbal/Supplements:  none Past antihistamines/decongestants:  none Other past therapies:   none   Family history of headache:  no  Past Medical History: Past Medical History:  Diagnosis Date  . Abnormal cervical Pap smear with positive HPV DNA test 01/26/2015  . Anemia   . GERD (gastroesophageal reflux disease)   . Low back pain 01/26/2015  . Migraine     Medications: Outpatient Encounter Medications as of 03/31/2019  Medication Sig  . bismuth subsalicylate (PEPTO BISMOL) 262 MG/15ML suspension Take 30 mLs by mouth every 6 (six) hours as needed.  . clobetasol ointment (TEMOVATE) 0.05 % APPLY TO AFFECTED AREAS ON HANDS TWICE DAILY UNTIL CLEAR, THEN AS NEEDED  . cyclobenzaprine (FLEXERIL) 5 MG tablet Take 1 tablet (5 mg total) by mouth 3 (three) times daily as needed for muscle spasms.  . Iron-Folic [id number] 150-1.25 MG TABS Take 1 tablet by mouth at bedtime.  . nortriptyline (PAMELOR) 10 MG capsule Take 1 capsule (10 mg total) by mouth at bedtime.  . pantoprazole (PROTONIX) 40 MG tablet Take 1 tablet (40 mg total) by mouth daily.  . SUMAtriptan (IMITREX) 100 MG tablet Take 1 tablet earliest onset of migraine.  May repeat in 2 hours if headache persists or recurs.  Maximum 2 tablets in 24 hours  . topiramate (TOPAMAX) 50 MG tablet TAKE 1 TABLET BY MOUTH EVERYDAY AT BEDTIME  . Vitamin D, Ergocalciferol, (DRISDOL) 1.25 MG (50000 UT) CAPS capsule Take 1 capsule (50,000 Units total) by mouth every 7 (seven) days. x12 weeks.   No facility-administered encounter medications on file as of 03/31/2019.    Allergies: No Known Allergies  Family History: Family History  Problem Relation Age of Onset  . Hypertension Mother   . Depression Mother   . Hyperlipidemia Mother   . Depression Sister   . Anxiety disorder Sister   . Anxiety disorder Brother   . Eczema Daughter   . COPD Maternal Grandmother   . Emphysema Maternal Grandmother   . Cancer Maternal Grandfather        stomach cancer    Social History: Social History   Socioeconomic History  . Marital  status: Single    Spouse name: Not on file  . Number of children: 1  . Years of education: Not on file  . Highest education level: Some college, no degree  Occupational History  . Occupation: Clinical support Engineer, production: [employer]  Tobacco Use  . Smoking status: Never Smoker  . Smokeless tobacco: Never Used  Substance and Sexual Activity  . Alcohol use: Yes    Comment: occassional - 3 glasses wine/month  . Drug use: No  . Sexual activity: Yes    Birth control/protection: None  Other Topics Concern  . Not on file  Social History Narrative   Patient is right-handed. She lives in a two level home. She drinks coffee and tea 2-4 x a week. She does not exercise.   Social Determinants of Health   Financial Resource Strain:   . Difficulty of Paying Living Expenses: Not on file  Food Insecurity:   . Worried About Charity fundraiser in the Last Year: Not on file  . Ran Out of Food in the Last Year: Not on file  Transportation Needs:   . Lack of Transportation (Medical): Not on file  . Lack of Transportation (Non-Medical): Not on file  Physical Activity:   . Days of Exercise per Week: Not on file  . Minutes of Exercise per Session: Not on file  Stress:   . Feeling of Stress : Not on file  Social Connections:   . Frequency of Communication with Friends and Family: Not on file  . Frequency of Social Gatherings with Friends and Family: Not on file  . Attends Religious Services: Not on file  . Active Member of Clubs or Organizations: Not on file  . Attends Archivist Meetings: Not on file  . Marital Status: Not on file  Intimate Partner Violence:   . Fear of Current or Ex-Partner: Not on file  . Emotionally Abused: Not on file  . Physically Abused: Not on file  . Sexually Abused: Not on file    Observations/Objective:   Height 5\' 2"  (1.575 m), weight 260 lb (117.9 kg). No acute distress.  Alert and oriented.  Speech fluent and not dysarthric.  Language  intact.  Eyes orthophoric on primary gaze.  Face symmetric.  Assessment and Plan:   Migraine without aura, without status migrainosus, not intractable  1.  For preventative management, continue nortriptyline 10mg  at bedtime.  We can increase to 25mg  at bedtime in 2.5 to 3 weeks if needed 2.  For abortive therapy, sumatriptan 100mg  3.  Limit use of pain relievers to no more than 2 days out of week to prevent risk of rebound or medication-overuse headache. 4.  Keep headache diary 5.  Exercise, hydration, caffeine cessation, sleep hygiene, monitor for and avoid triggers 6.  Consider:  magnesium citrate 400mg  daily, riboflavin 400mg  daily, and coenzyme Q10 100mg  three times daily 7. Follow up 4 months   Follow Up Instructions:    -I discussed the assessment and treatment plan with the patient. The patient was provided an opportunity to ask questions and all were answered. The patient agreed with the plan and demonstrated an understanding of the instructions.   The patient was advised to call back or seek an in-person evaluation if the symptoms worsen or if the condition fails to improve as anticipated.     Dudley Major, DO

## 2019-03-30 ENCOUNTER — Encounter: Payer: Self-pay | Admitting: Neurology

## 2019-03-31 ENCOUNTER — Other Ambulatory Visit: Payer: Self-pay

## 2019-03-31 ENCOUNTER — Telehealth (INDEPENDENT_AMBULATORY_CARE_PROVIDER_SITE_OTHER): Payer: BC Managed Care – PPO | Admitting: Neurology

## 2019-03-31 VITALS — Ht 62.0 in | Wt 260.0 lb

## 2019-03-31 DIAGNOSIS — G43009 Migraine without aura, not intractable, without status migrainosus: Secondary | ICD-10-CM | POA: Diagnosis not present

## 2019-04-12 ENCOUNTER — Other Ambulatory Visit: Payer: Self-pay | Admitting: Neurology

## 2019-05-23 ENCOUNTER — Other Ambulatory Visit: Payer: Self-pay | Admitting: Family Medicine

## 2019-05-23 DIAGNOSIS — E559 Vitamin D deficiency, unspecified: Secondary | ICD-10-CM

## 2019-05-29 ENCOUNTER — Other Ambulatory Visit: Payer: Self-pay | Admitting: Family Medicine

## 2019-05-29 DIAGNOSIS — K219 Gastro-esophageal reflux disease without esophagitis: Secondary | ICD-10-CM

## 2019-05-30 NOTE — Telephone Encounter (Signed)
Refill request for general medication.  Last office visit:03/04/19  No follow-ups on file.

## 2019-06-22 ENCOUNTER — Telehealth: Payer: Self-pay | Admitting: Neurology

## 2019-06-22 NOTE — Telephone Encounter (Signed)
I advised ER for evaluation and treatment, severe headache

## 2019-06-22 NOTE — Telephone Encounter (Signed)
Patient is calling in about her left eye feels like it is coming out of her eye and it is feeling very painful. She is wanting to know what to do for it. She has taken all the medication she could. Thanks!

## 2019-07-11 ENCOUNTER — Encounter: Payer: Self-pay | Admitting: Family Medicine

## 2019-07-13 NOTE — Telephone Encounter (Signed)
Pt inquired about FMLA papers -done last in August 2020, in the past 6 months she has not seen [place] for migraines due to being referred to specialist.  Pt will need to have specialist who is managing migraines -fill out FMLA .  Roselyn Reef please advise pt  Delsa Grana, PA-C

## 2019-07-14 ENCOUNTER — Encounter: Payer: Self-pay | Admitting: Family Medicine

## 2019-07-14 ENCOUNTER — Other Ambulatory Visit: Payer: Self-pay

## 2019-07-14 ENCOUNTER — Ambulatory Visit (INDEPENDENT_AMBULATORY_CARE_PROVIDER_SITE_OTHER): Payer: BC Managed Care – PPO | Admitting: Family Medicine

## 2019-07-14 VITALS — BP 126/78 | HR 79 | Temp 97.8°F | Resp 14 | Wt 286.8 lb

## 2019-07-14 DIAGNOSIS — Z5181 Encounter for therapeutic drug level monitoring: Secondary | ICD-10-CM

## 2019-07-14 DIAGNOSIS — Z0289 Encounter for other administrative examinations: Secondary | ICD-10-CM

## 2019-07-14 DIAGNOSIS — G43009 Migraine without aura, not intractable, without status migrainosus: Secondary | ICD-10-CM | POA: Diagnosis not present

## 2019-07-14 DIAGNOSIS — E559 Vitamin D deficiency, unspecified: Secondary | ICD-10-CM | POA: Diagnosis not present

## 2019-07-14 DIAGNOSIS — D509 Iron deficiency anemia, unspecified: Secondary | ICD-10-CM | POA: Diagnosis not present

## 2019-07-14 DIAGNOSIS — R7303 Prediabetes: Secondary | ICD-10-CM

## 2019-07-14 DIAGNOSIS — K219 Gastro-esophageal reflux disease without esophagitis: Secondary | ICD-10-CM

## 2019-07-14 MED ORDER — CLOBETASOL PROPIONATE 0.05 % EX OINT: TOPICAL_OINTMENT | CUTANEOUS | 1 refills | Status: DC

## 2019-07-14 NOTE — Progress Notes (Signed)
[name], female    DOB: 21-Oct-1985, 34 y.o.   MRN: [id number]  PCP: Delsa Grana, PA-C  Chief Complaint  Patient presents with  . FMLA    paperwork for migraines  . Eczema  . Medication Refill    Subjective:   Sherri Robertson is a 34 y.o. female, presents to clinic with CC of the following:  HPI  Pt is here for [facility] paperwork.  She is seeing neurology for management.  Our last visit regarding migraines was 12/01/2018 I have reviewed her last neuro appt, was 03/31/2019, she did not do well with topiramate, started nortriptyline 10 mg with option to increase to 25 mg daily at bedtime, rizatriptan ineffective, trying sumatriptan 100 mg abortive.  HA's occurring 2x a month on average.  She is continuing to have some dull daily headaches which she still feels a related to night shift work and poor sleep or lack of sleep.  Some mornings it is there soon as she wakes up and sometimes the headaches develop when she is back to work the next day. She has never had any evaluation for sleep apnea.  Neuro plan per last office visit with Dr. Tomi Likens: Assessment and Plan:   Migraine without aura, without status migrainosus, not intractable  1.  For preventative management, continue nortriptyline 10mg  at bedtime.  We can increase to 25mg  at bedtime in 2.5 to 3 weeks if needed 2.  For abortive therapy, sumatriptan 100mg  3.  Limit use of pain relievers to no more than 2 days out of week to prevent risk of rebound or medication-overuse headache. 4.  Keep headache diary 5.  Exercise, hydration, caffeine cessation, sleep hygiene, monitor for and avoid triggers 6.  Consider:  magnesium citrate 400mg  daily, riboflavin 400mg  daily, and coenzyme Q10 100mg  three times daily 7. Follow up 4 months  She should be following up next month, paperwork is due again for FMLA for migraines -completed today to allow for 2-3 episodes a month lasting 1 to 2 days.  Courage patient to follow-up with  neurology and inquire about third shifter obstructive sleep apnea as a possible underlying cause of worse headaches and daily headaches.   GERD on Protonix -she is tolerating she is not having any abdominal pain severe indigestion, denies any change in bowel movements  iron deficiency anemia: Last H/H 10.9/36.0 MCV low 70.3, platelets high, iron low 17, normal TIBC, low percent saturation was at 6 and last ferritin level was 56 normal.  She does have a history of irregular periods with some very heavy and prolonged menses she has followed up with OB/GYN, she saw Dr. Marcelline Mates who patient states was not very concerned with her hemoglobin level and currently patient is having no heavy bleeding, no polyps found, and they did not start any medications to lighten her menstrual blood loss. 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  Last saw Hematology 3 years ago or so?  Believes she saw Dr. Janese Banks at the cancer center, and before that saw a female physician who told her it was because of her periods and he did not feel he needed to see her again. She does continue to be very fatigued and tired have very little energy.  She denies any chest pain shortness of  breath palpitations, near syncope.  Vit D deficiency new diagnosis August 2020- not on daily supplement Vitamin D very low when checked August 2020, vitamin D was 13, took high-dose Rx vit d supplement, this is at least the second time that she is on the prescription supplement, she completed and never started a daily over-the-counter vitamin D supplement.     Morbid Obesity and prediabetes:   Lab Results  Component Value Date   HGBA1C 6.3 (H) 12/01/2018  Patient has continued to be very busy with work she has not had any significant weight changes since last  August. Previously tried [place] but was unable to get coverage for it patient still does not want to try Metformin, will follow up on this at her wellness visit/complete physical in the next 4 months  Wt Readings from Last 5 Encounters:  07/14/19 286 lb 12.8 oz (130.1 kg)  03/30/19 260 lb (117.9 kg)  02/15/19 286 lb (129.7 kg)  01/18/19 285 lb 14.4 oz (129.7 kg)  12/01/18 284 lb 11.2 oz (129.1 kg)  BMI 52.46 Prediabetes, abdominal obesity do suspect she has developed syndrome, her last lipid profile was in 2019 was normal   Patient Active Problem List   Diagnosis Date Noted  . Migraine without aura and without status migrainosus, not intractable 12/01/2018  . Gastroesophageal reflux disease 12/01/2018  . Prediabetes 02/05/2017  . 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 Thyroid nodule 01/22/2017  . Vitamin D deficiency 01/22/2017  . Acanthosis nigricans 08/05/2015  . Anxiety and depression 01/26/2015  . Dysmenorrhea 01/26/2015  . Fibroid 01/26/2015  . Anemia, iron deficiency 09/21/2009  . Obesity, morbid, BMI 50 or higher ([place]) 07/27/2009      Current Outpatient Medications:  .  bismuth subsalicylate (PEPTO BISMOL) 262 MG/15ML suspension, Take 30 mLs by mouth every 6 (six) hours as needed., Disp: , Rfl:  .  clobetasol ointment (TEMOVATE) 0.05 %, APPLY TO AFFECTED AREAS ON HANDS TWICE DAILY UNTIL CLEAR, THEN AS NEEDED, Disp: 30 g, Rfl: 1 .  Iron-Folic [id number] 150-1.25 MG TABS, Take 1 tablet by mouth at bedtime., Disp: 90 tablet, Rfl: 1 .  nortriptyline (PAMELOR) 10 MG capsule, TAKE 1 CAPSULE (10 MG TOTAL) BY MOUTH AT BEDTIME., Disp: 90 capsule, Rfl: 2 .  pantoprazole (PROTONIX) 40 MG tablet, TAKE 1 TABLET BY MOUTH EVERY DAY, Disp: 90 tablet, Rfl: 1 .  SUMAtriptan (IMITREX) 100 MG tablet, Take 1 tablet earliest onset of migraine.  May repeat in 2 hours if headache persists or recurs.  Maximum 2 tablets in 24 hours, Disp: 10 tablet, Rfl: 2   No Known Allergies   Family History   Problem Relation Age of Onset  . Hypertension Mother   . Depression Mother   . Hyperlipidemia Mother   . Depression Sister   . Anxiety disorder Sister   . Anxiety disorder Brother   . Eczema Daughter   . COPD Maternal Grandmother   . Emphysema Maternal Grandmother   . Cancer Maternal Grandfather        stomach cancer     Social History   Socioeconomic History  . Marital status: Single    Spouse name: Not on file  . Number of children: 1  . Years of education: Not on file  . Highest education level: Some college, no degree  Occupational History  . Occupation: Clinical support Engineer, production: [employer]  Tobacco Use  . Smoking status: Never Smoker  . Smokeless tobacco: Never Used  Substance and Sexual  Activity  . Alcohol use: Yes    Comment: occassional - 3 glasses wine/month  . Drug use: No  . Sexual activity: Yes    Birth control/protection: None  Other Topics Concern  . Not on file  Social History Narrative   Patient is right-handed. She lives in a two level home. She drinks coffee and tea 2-4 x a week. She does not exercise.   Social Determinants of Health   Financial Resource Strain:   . Difficulty of Paying Living Expenses:   Food Insecurity:   . Worried About Charity fundraiser in the Last Year:   . Arboriculturist in the Last Year:   Transportation Needs:   . Film/video editor (Medical):   Marland Kitchen Lack of Transportation (Non-Medical):   Physical Activity:   . Days of Exercise per Week:   . Minutes of Exercise per Session:   Stress:   . Feeling of Stress :   Social Connections:   . Frequency of Communication with Friends and Family:   . Frequency of Social Gatherings with Friends and Family:   . Attends Religious Services:   . Active Member of Clubs or Organizations:   . Attends Archivist Meetings:   Marland Kitchen Marital Status:   Intimate Partner Violence:   . Fear of Current or Ex-Partner:   . Emotionally Abused:   Marland Kitchen Physically Abused:    . Sexually Abused:     Chart Review Today: I personally reviewed active problem list, medication list, allergies, family history, social history, health maintenance, notes from last encounter, lab results, imaging with the patient/caregiver today.  Review of Systems  Constitutional: Positive for fatigue. Negative for activity change, appetite change, fever and unexpected weight change.  HENT: Negative.   Eyes: Negative.   Respiratory: Negative.   Cardiovascular: Negative.   Gastrointestinal: Negative.   Endocrine: Negative.   Genitourinary: Negative.   Musculoskeletal: Negative.   Skin: Negative.   Allergic/Immunologic: Negative.   Neurological: Positive for headaches. Negative for weakness.  Hematological: Negative.   Psychiatric/Behavioral: Negative.   All other systems reviewed and are negative.      Objective:   Vitals:   07/14/19 1051  BP: 126/78  Pulse: 79  Resp: 14  Temp: 97.8 F (36.6 C)  SpO2: 96%  Weight: 286 lb 12.8 oz (130.1 kg)    Body mass index is 52.46 kg/m.  Physical Exam Vitals and nursing note reviewed.  Constitutional:      General: She is not in acute distress.    Appearance: Normal appearance. She is well-developed. She is morbidly obese. She is not ill-appearing, toxic-appearing or diaphoretic.     Interventions: Face mask in place.     Comments: Well but tired appearing   HENT:     Head: Normocephalic and atraumatic.     Comments: No pallor    Right Ear: External ear normal.     Left Ear: External ear normal.  Eyes:     General: Lids are normal. No scleral icterus.       Right eye: No discharge.        Left eye: No discharge.     Conjunctiva/sclera: Conjunctivae normal.  Neck:     Trachea: Phonation normal. No tracheal deviation.  Cardiovascular:     Rate and Rhythm: Normal rate and regular rhythm.     Pulses: Normal pulses.          Radial pulses are 2+ on the right side and 2+ on  the left side.       Posterior tibial pulses  are 2+ on the right side and 2+ on the left side.     Heart sounds: Normal heart sounds. No murmur. No friction rub. No gallop.   Pulmonary:     Effort: Pulmonary effort is normal. No respiratory distress.     Breath sounds: Normal breath sounds. No stridor. No wheezing, rhonchi or rales.  Chest:     Chest wall: No tenderness.  Abdominal:     General: Bowel sounds are normal. There is no distension.     Palpations: Abdomen is soft.     Tenderness: There is no abdominal tenderness. There is no guarding or rebound.  Musculoskeletal:        General: No deformity. Normal range of motion.     Cervical back: Normal range of motion and neck supple.     Right lower leg: Edema present.     Left lower leg: Edema present.  Lymphadenopathy:     Cervical: No cervical adenopathy.  Skin:    General: Skin is warm and dry.     Capillary Refill: Capillary refill takes less than 2 seconds.     Coloration: Skin is not jaundiced or pale.     Findings: No rash.  Neurological:     Mental Status: She is alert and oriented to person, place, and time.     Motor: No abnormal muscle tone.     Gait: Gait normal.  Psychiatric:        Speech: Speech normal.        Behavior: Behavior normal. Behavior is cooperative.   b/l 1+ edema, mild pitting        Assessment & Plan:      ICD-10-CM   1. Migraine without aura and without status migrainosus, not intractable  G43.009    continue plan with neurology, keep your f/up visit  2. Encounter for completion of form with patient  Z02.89    FMLA completed again   3. Iron deficiency anemia, unspecified iron deficiency anemia type  D50.9 CBC with Differential/Platelet    Iron, TIBC and Ferritin Panel   Patient is continued on same iron supplement labs were last done she has seen GYN no meds or treatment done or indicated, continues to feel fatigued, recheck    4. Vitamin D deficiency  E55.9 VITAMIN D 25 Hydroxy (Vit-D Deficiency, Fractures)   Severe vitamin D  deficiency for many years, last labs very low in August she completed 12 weeks of Rx supplement, no current supplement or over-the-counter  5. Prediabetes  [id number] COMPLETE METABOLIC PANEL WITH GFR    Hemoglobin A1c   Continued morbid obesity fortunately no weight increase, will recheck labs  6. Gastroesophageal reflux disease, unspecified whether esophagitis present  K21.9    Well-controlled GI symptoms, continue Protonix daily but may wean to as needed if able  7. Obesity, morbid, BMI 50 or higher (H[facility])  E66.01   8. Medication monitoring encounter  Z51.81 CBC with Differential/Platelet    COMPLETE METABOLIC PANEL WITH GFR    Hemoglobin A1c    VITAMIN D 25 Hydroxy (Vit-D Deficiency, Fractures)    Iron, TIBC and Ferritin Panel    Patient has long history of iron deficiency anemia she has previously been to hematology, gotten iron infusions, was thought that heavy menstrual cycle was causing her deficiency, she did follow-up with OB/GYN who was not really concerned with her cycles, blood loss or her lab work.  She  is continue the same oral iron supplement and has continued to be very fatigued.  We will follow up on that today.  With severe vitamin D deficiency and iron deficiency without significant source of blood loss concerned that she may have underlying absorption issue?  Which I do not think is ever been investigated.  She has had a thorough work-up by Dr. Janese Banks few years ago, certainly if she has worsening anemia we can have her follow-up with Dr. Janese Banks.   Greater than 50% of this visit was spent in direct face-to-face counseling, obtaining history and physical, discussing and educating pt on treatment plan.  Total time of this visit was 40 min + with routine f/up and additional paperwork done today.  Remainder of time involved but was not limited to reviewing chart (recent and pertinent OV notes and labs), documentation in EMR, and coordinating care and treatment plan.   Delsa Grana,  PA-C 07/14/19 12:15 PM

## 2019-07-15 LAB — IRON,TIBC AND FERRITIN PANEL
%SAT: 6 % (calc) — ABNORMAL LOW (ref 16–45)
Ferritin: 44 ng/mL (ref 16–154)
Iron: 19 ug/dL — ABNORMAL LOW (ref 40–190)
TIBC: 314 mcg/dL (calc) (ref 250–450)

## 2019-07-15 LAB — COMPLETE METABOLIC PANEL WITH GFR
AG Ratio: 1 (calc) (ref 1.0–2.5)
ALT: 8 U/L (ref 6–29)
AST: 14 U/L (ref 10–30)
Albumin: 3.7 g/dL (ref 3.6–5.1)
Alkaline phosphatase (APISO): 77 U/L (ref 31–125)
BUN: 11 mg/dL (ref 7–25)
CO2: 26 mmol/L (ref 20–32)
Calcium: 9.1 mg/dL (ref 8.6–10.2)
Chloride: 105 mmol/L (ref 98–110)
Creat: 0.69 mg/dL (ref 0.50–1.10)
GFR, Est African American: 132 mL/min/{1.73_m2} (ref >=60)
GFR, Est Non African American: 114 mL/min/{1.73_m2} (ref >=60)
Globulin: 3.6 g/dL (calc) (ref 1.9–3.7)
Glucose, Bld: 86 mg/dL (ref 65–99)
Potassium: 4.3 mmol/L (ref 3.5–5.3)
Sodium: 139 mmol/L (ref 135–146)
Total Bilirubin: 0.4 mg/dL (ref 0.2–1.2)
Total Protein: 7.3 g/dL (ref 6.1–8.1)

## 2019-07-15 LAB — CBC WITH DIFFERENTIAL/PLATELET
Absolute Monocytes: 558 cells/uL (ref 200–950)
Basophils Absolute: 19 cells/uL (ref 0–200)
Basophils Relative: 0.2 %
Eosinophils Absolute: 112 cells/uL (ref 15–500)
Eosinophils Relative: 1.2 %
HCT: 33.8 % — ABNORMAL LOW (ref 35.0–45.0)
Hemoglobin: 9.9 g/dL — ABNORMAL LOW (ref 11.7–15.5)
Lymphs Abs: 3553 cells/uL (ref 850–3900)
MCH: 20.4 pg — ABNORMAL LOW (ref 27.0–33.0)
MCHC: 29.3 g/dL — ABNORMAL LOW (ref 32.0–36.0)
MCV: 69.7 fL — ABNORMAL LOW (ref 80.0–100.0)
MPV: 9.2 fL (ref 7.5–12.5)
Monocytes Relative: 6 %
Neutro Abs: 5059 cells/uL (ref 1500–7800)
Neutrophils Relative %: 54.4 %
Platelets: 500 10*3/uL — ABNORMAL HIGH (ref 140–400)
RBC: 4.85 10*6/uL (ref 3.80–5.10)
RDW: 16.7 % — ABNORMAL HIGH (ref 11.0–15.0)
Total Lymphocyte: 38.2 %
WBC: 9.3 10*3/uL (ref 3.8–10.8)

## 2019-07-15 LAB — VITAMIN D 25 HYDROXY (VIT D DEFICIENCY, FRACTURES): Vit D, 25-Hydroxy: 12 ng/mL — ABNORMAL LOW (ref 30–100)

## 2019-07-15 LAB — HEMOGLOBIN A1C
Hgb A1c MFr Bld: 6.2 % of total Hgb — ABNORMAL HIGH (ref <5.7)
Mean Plasma Glucose: 131 (calc)
eAG (mmol/L): 7.3 (calc)

## 2019-07-15 LAB — CBC MORPHOLOGY

## 2019-07-19 ENCOUNTER — Encounter: Payer: Self-pay | Admitting: Family Medicine

## 2019-07-20 ENCOUNTER — Other Ambulatory Visit: Payer: Self-pay | Admitting: Family Medicine

## 2019-07-20 MED ORDER — VITAMIN D (ERGOCALCIFEROL) 1.25 MG (50000 UNIT) PO CAPS: 50000.0000 [IU] | ORAL_CAPSULE | ORAL | 0 refills | Status: DC

## 2019-07-20 NOTE — Addendum Note (Signed)
Addended by: Delsa Grana on: 07/20/2019 02:54 PM   Modules accepted: Orders

## 2019-07-26 ENCOUNTER — Ambulatory Visit: Payer: BC Managed Care – PPO | Admitting: Family Medicine

## 2019-08-04 NOTE — Progress Notes (Signed)
NEUROLOGY FOLLOW UP OFFICE NOTE  Sherri Robertson [id number]  HISTORY OF PRESENT ILLNESS: Sherri Robertson is a 34 year old female who follows up for migraines.  UPDATE:  Still roughly occurring twice a month.  She was found to have low iron and vitamin D deficiency and has since started supplements.  She takes a sumatriptan and goes to sleep.  they are severe.  When she wakes up after a couple of hours, there is a lingering frontal headache but significantly improved.  Current NSAIDS:none Current analgesics:none Current triptans:sumatriptan 100mg  Current ergotamine:none Current anti-emetic:none Current muscle relaxants:none Current anti-anxiolytic:none Current sleep aide:none Current Antihypertensive medications:none Current Antidepressant medications:nortriptyline 10mg  at bedtime Current Anticonvulsant medications:none Current anti-CGRP:none Current Vitamins/Herbal/Supplements:nD Current Antihistamines/Decongestants:none Other therapy:none Hormone/birth control:none  Caffeine:No coffee or soda Diet:Limited dairy intake.  Hydrates. Exercise:Not routine Depression:no; Anxiety:stable Other pain:no Sleep hygiene:Good. 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 She would need to sleep it off in dark room with something over her eyes. They would occur every 3 to 4 months. However, they have getting more frequent in 2019. She went to the ED twice last year.  Location:Behind the eyes radiating to back of head (may be either side, mostly left eye) Quality:Pressure, pounding Initial intensity:8/10. Shedenies new headache, thunderclap headache Aura:no Premonitory Phase:no Postdrome:fatigue Associated symptoms:Photophobia, phonophobia, occasional nausea.Shedenies associated visual disturbance,unilateral numbness or weakness. Initial duration:1 to 1  1/2 days. Normally occur when she gets up from sleep. Rarely occurs at work. Initial frequency:Twice a month. Initial frequency of abortive medication:  Triggers:  Dairy (cheese, milk), sleep deprivation Relieving factors:Something cool over her face Activity:aggravates  She has been to the ED a handful of times over the years due to headache. CT head from 07/22/13 and 02/20/16 were personally reviewed and were normal.   Past NSAIDS:naproxen, ibuprofen Past analgesics:Fioricet, acetaminophen Past abortive triptans:rizatriptan 10mg  Past abortive ergotamine:none Past muscle relaxants:cyclobenzaprine Past anti-emetic:Reglan Past antihypertensive medications:none Past antidepressant medications:none Past anticonvulsant medications:topiramate 50mg  (anxiety, irritability) Past anti-CGRP:none Past vitamins/Herbal/Supplements:none Past antihistamines/decongestants:none Other past therapies:none   Family history of headache:no  PAST MEDICAL HISTORY: Past Medical History:  Diagnosis Date  . Abnormal cervical Pap smear with positive HPV DNA test 01/26/2015  . Anemia   . GERD (gastroesophageal reflux disease)   . Low back pain 01/26/2015  . Migraine     MEDICATIONS: Current Outpatient Medications on File Prior to Visit  Medication Sig Dispense Refill  . bismuth subsalicylate (PEPTO BISMOL) 262 MG/15ML suspension Take 30 mLs by mouth every 6 (six) hours as needed.    . clobetasol ointment (TEMOVATE) 0.05 % APPLY TO AFFECTED AREAS ON HANDS TWICE DAILY UNTIL CLEAR, THEN AS NEEDED 30 g 1  . Iron-Folic [id number] 150-1.25 MG TABS Take 1 tablet by mouth at bedtime. 90 tablet 1  . nortriptyline (PAMELOR) 10 MG capsule TAKE 1 CAPSULE (10 MG TOTAL) BY MOUTH AT BEDTIME. 90 capsule 2  . pantoprazole (PROTONIX) 40 MG tablet TAKE 1 TABLET BY MOUTH EVERY DAY 90 tablet 1  . SUMAtriptan (IMITREX) 100 MG tablet Take 1 tablet earliest onset of  migraine.  May repeat in 2 hours if headache persists or recurs.  Maximum 2 tablets in 24 hours 10 tablet 2  . Vitamin D, Ergocalciferol, (DRISDOL) 1.25 MG (50000 UNIT) CAPS capsule Take 1 capsule (50,000 Units total) by mouth every 7 (seven) days. x12 weeks. 12 capsule 0   No current facility-administered medications  on file prior to visit.    ALLERGIES: No Known Allergies  FAMILY HISTORY: Family History  Problem Relation Age of Onset  . Hypertension Mother   . Depression Mother   . Hyperlipidemia Mother   . Depression Sister   . Anxiety disorder Sister   . Anxiety disorder Brother   . Eczema Daughter   . COPD Maternal Grandmother   . Emphysema Maternal Grandmother   . Cancer Maternal Grandfather        stomach cancer    SOCIAL HISTORY: Social History   Socioeconomic History  . Marital status: Single    Spouse name: Not on file  . Number of children: 1  . Years of education: Not on file  . Highest education level: Some college, no degree  Occupational History  . Occupation: Clinical support Engineer, production: [employer]  Tobacco Use  . Smoking status: Never Smoker  . Smokeless tobacco: Never Used  Substance and Sexual Activity  . Alcohol use: Yes    Comment: occassional - 3 glasses wine/month  . Drug use: No  . Sexual activity: Yes    Birth control/protection: None  Other Topics Concern  . Not on file  Social History Narrative   Patient is right-handed. She lives in a two level home. She drinks coffee and tea 2-4 x a week. She does not exercise.   Social Determinants of Health   Financial Resource Strain:   . Difficulty of Paying Living Expenses:   Food Insecurity:   . Worried About Charity fundraiser in the Last Year:   . Arboriculturist in the Last Year:   Transportation Needs:   . Film/video editor (Medical):   Marland Kitchen Lack of Transportation (Non-Medical):   Physical Activity:   . Days of Exercise per Week:   . Minutes of Exercise per Session:     Stress:   . Feeling of Stress :   Social Connections:   . Frequency of Communication with Friends and Family:   . Frequency of Social Gatherings with Friends and Family:   . Attends Religious Services:   . Active Member of Clubs or Organizations:   . Attends Archivist Meetings:   Marland Kitchen Marital Status:   Intimate Partner Violence:   . Fear of Current or Ex-Partner:   . Emotionally Abused:   Marland Kitchen Physically Abused:   . Sexually Abused:     PHYSICAL EXAM: Blood pressure 119/78, pulse 89, height 5\' 2"  (1.575 m), weight 288 lb (130.6 kg), last menstrual period 07/11/2019, SpO2 100 %. General: No acute distress.  Patient appears well-groomed.   Head:  Normocephalic/atraumatic Eyes:  Fundi examined but not visualized Neck: supple, no paraspinal tenderness, full range of motion Heart:  Regular rate and rhythm Lungs:  Clear to auscultation bilaterally Back: No paraspinal tenderness Neurological Exam: alert and oriented to person, place, and time. Attention span and concentration intact, recent and remote memory intact, fund of knowledge intact.  Speech fluent and not dysarthric, language intact.  CN II-XII intact. Bulk and tone normal, muscle strength 5/5 throughout.  Sensation to light touch  intact.  Deep tendon reflexes 2+ throughout.  Finger to nose testing intact.  Gait normal, Romberg negative.  IMPRESSION: Migraine without aura, without status migrainosus, not intractable  PLAN: 1.  For preventative management, nortriptyline 10mg  at bedtime 2.  For abortive therapy, will have her try Nurtec to see if more effective than sumatriptan. 3.  Limit use of pain relievers  to no more than 2 days out of week to prevent risk of rebound or medication-overuse headache. 4.  Keep headache diary 5.  Exercise, hydration, caffeine cessation, sleep hygiene, monitor for and avoid triggers 6. Follow up 6 months   Metta Clines, DO  CC: Delsa Grana, PA-C

## 2019-08-05 ENCOUNTER — Other Ambulatory Visit: Payer: Self-pay | Admitting: Family Medicine

## 2019-08-05 ENCOUNTER — Other Ambulatory Visit: Payer: Self-pay

## 2019-08-05 ENCOUNTER — Ambulatory Visit: Payer: BC Managed Care – PPO | Admitting: Neurology

## 2019-08-05 ENCOUNTER — Other Ambulatory Visit: Payer: Self-pay | Admitting: Neurology

## 2019-08-05 ENCOUNTER — Encounter: Payer: Self-pay | Admitting: Neurology

## 2019-08-05 VITALS — BP 119/78 | HR 89 | Ht 62.0 in | Wt 288.0 lb

## 2019-08-05 DIAGNOSIS — G43009 Migraine without aura, not intractable, without status migrainosus: Secondary | ICD-10-CM | POA: Diagnosis not present

## 2019-08-05 DIAGNOSIS — D509 Iron deficiency anemia, unspecified: Secondary | ICD-10-CM

## 2019-08-05 DIAGNOSIS — E559 Vitamin D deficiency, unspecified: Secondary | ICD-10-CM

## 2019-08-05 MED ORDER — SUMATRIPTAN SUCCINATE 100 MG PO TABS: ORAL_TABLET | ORAL | 2 refills | Status: DC

## 2019-08-05 NOTE — Patient Instructions (Addendum)
1.  Continue nortriptyline 10mg  at bedtime 2.  Next time you get a migraine, take Nurtec 1 dissolvable tablet.  No more than 1 in 24 hours.  If effective, let me know and I will place a prescription.  Otherwise, continue sumatriptan. 3.  Follow up in 6 months.

## 2019-09-05 ENCOUNTER — Ambulatory Visit: Payer: Self-pay | Admitting: *Deleted

## 2019-09-05 NOTE — Telephone Encounter (Signed)
Pt called in c/o left sided abd pain that started Friday.   She has not had a BM in 3 days.   She is on a diet and has lost 20 lbs.   She is drinking a gallon of water a day and walking 2 miles a day.   "I can't believe I'm constipated".    She has not tried any stool softners or laxatives of any kind.   The pain is on her left side on the outer portion midway.   "Like under my arm portion but the pain the straight down about midway of my abd.   It's tender.  No history of abd surgeries except a C section.    She doesn't usually have the problem with being constipated.  There were no openings with any of the providers at [facility] for today.   The protocol is to be seen within 4 hours. I referred her to the urgent care center.   She was agreeable to going.   "There is one in [place] I can go to".   "I live in [place]".   I instructed her to go on now to be evaluated which she agreed to do.     Reason for Disposition . [1] MILD-MODERATE pain AND [2] constant AND [3] present > 2 hours  Answer Assessment - Initial Assessment Questions 1. LOCATION: "Where does it hurt?"      Having abd pain on med left side.   2 days it was hurting on right side.    I'm drinking a gallon of water a day.   I've not have a BM in 3 days. My left side is so tender.   2. RADIATIOI'  I've lost 20 lbs since my last OV. 4. SUDDEN: "Gradual or sudden onset?"     Since I've changed my diet I'm having constipation.   No bread.   I'm eating green veggies. 5. PATTERN "Does the pain come and go, or is it constant?"    - If constant: "Is it getting better, staying the same, or worsening?"      (Note: Constant means the pain never goes away completely; most serious pain is constant and it progresses)     - If intermittent: "How long does it last?" "Do you have pain now?"     (Note: Intermittent means the pain goes away completely between bouts)     The left sided pain is constant.   I've had a C section long ago. 6.  SEVERITY: "How bad is the pain?"  (e.g., Scale 1-10; mild, moderate, or severe)   - MILD (1-3): doesn't interfere with normal activities, abdomen soft and not tender to touch    - MODERATE (4-7): interferes with normal activities or awakens from sleep, tender to touch    - SEVERE (8-10): excruciating pain, doubled over, unable to do any normal activities      5 7. RECURRENT SYMPTOM: "Have you ever had this type of abdominal pain before?" If so, ask: "When was the last time?" and "What happened that time?"      No 8. CAUSE: "What do you think is causing the abdominal pain?"     Constipation from my diet change. 9. RELIEVING/AGGRAVATING FACTORS: "What makes it better or worse?" (e.g., movement, antacids, bowel movement)     Not tried stool softners or laxative. 10. OTHER SYMPTOMS: "Has there been any vomiting, diarrhea, constipation, or urine problems?"       No 11. PREGNANCY: "Is  there any chance you are pregnant?" "When was your last menstrual period?"       I just had cycle.  Protocols used: ABDOMINAL PAIN [facility]

## 2019-09-05 NOTE — Telephone Encounter (Signed)
Recommend she stop any oral iron until seen

## 2019-09-05 NOTE — Telephone Encounter (Signed)
Left voice mail

## 2019-09-06 ENCOUNTER — Other Ambulatory Visit: Payer: Self-pay

## 2019-09-06 ENCOUNTER — Ambulatory Visit (INDEPENDENT_AMBULATORY_CARE_PROVIDER_SITE_OTHER): Payer: BC Managed Care – PPO

## 2019-09-06 ENCOUNTER — Ambulatory Visit
Admission: EM | Admit: 2019-09-06 | Discharge: 2019-09-06 | Disposition: A | Discharge status: Home / Self Care | Payer: BC Managed Care – PPO | Attending: Family Medicine | Admitting: Family Medicine

## 2019-09-06 ENCOUNTER — Encounter: Payer: Self-pay | Admitting: Emergency Medicine

## 2019-09-06 DIAGNOSIS — R109 Unspecified abdominal pain: Secondary | ICD-10-CM

## 2019-09-06 LAB — URINALYSIS, COMPLETE (UACMP) WITH MICROSCOPIC
Glucose, UA: NEGATIVE mg/dL
Hgb urine dipstick: NEGATIVE
Ketones, ur: NEGATIVE mg/dL
Nitrite: NEGATIVE
Protein, ur: 30 mg/dL — AB
Specific Gravity, Urine: 1.025 (ref 1.005–1.030)
pH: 7.5 (ref 5.0–8.0)

## 2019-09-06 LAB — PREGNANCY, URINE: Preg Test, Ur: NEGATIVE

## 2019-09-06 MED ORDER — KETOROLAC TROMETHAMINE 10 MG PO TABS: 10.0000 mg | ORAL_TABLET | Freq: Four times a day (QID) | ORAL | 0 refills | Status: DC | PRN

## 2019-09-06 NOTE — Discharge Instructions (Signed)
Xray normal.  Medication as directed.  If persists or worsens, please see your PCP.  Take care  Dr. Lacinda Axon

## 2019-09-06 NOTE — ED Triage Notes (Signed)
Patient in today c/o left flank pain x 4 days. Patient states she has been constipated. She went 3 days without a BM. Patient took [place] and Dulcolax yesterday. Patient states she had 4-5 BMs yesterday after taking MOM and Dulcolax. Patient denies urinary frequency or pain. Patient denies fever.

## 2019-09-06 NOTE — ED Triage Notes (Signed)
Patient called PCP yesterday and was advised to come to Urgent Care. Patient states the nurse said it could be appendicitis or ectopic pregnancy.

## 2019-09-06 NOTE — ED Provider Notes (Signed)
[facility]    CSN: [id number] Arrival date & time: 09/06/19  1428      History   Chief Complaint Chief Complaint  Patient presents with  . Flank Pain    left  . Constipation   HPI  34 year old female presents with left side/flank pain.  4-day history of left side/flank pain.  Patient reports that she has had ongoing constipation.  She thought that this was the culprit.  She has taken milk of magnesia and Dulcolax and has subsequently had several bowel movements.  She states that this has not relieved her pain.  She continues to have left flank pain which she rates as 6/10 in severity.  No over-the-counter analgesics have been tried.  Denies urinary symptoms.  Does report nausea.  No vomiting.  She called her primary care physician and was instructed to come in for evaluation.  Last menstrual cycle was at the end of April.  No other associated symptoms.  No other complaints.  Past Medical History:  Diagnosis Date  . Abnormal cervical Pap smear with positive HPV DNA test 01/26/2015  . Anemia   . GERD (gastroesophageal reflux disease)   . Low back pain 01/26/2015  . Migraine     Patient Active Problem List   Diagnosis Date Noted  . Migraine without aura and without status migrainosus, not intractable 12/01/2018  . Gastroesophageal reflux disease 12/01/2018  . Prediabetes 02/05/2017  . 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 Thyroid nodule 01/22/2017  . Vitamin D deficiency 01/22/2017  . Acanthosis nigricans 08/05/2015  . Anxiety and depression 01/26/2015  . Dysmenorrhea 01/26/2015  . Fibroid 01/26/2015  . Anemia, iron deficiency 09/21/2009  . Obesity, morbid, BMI 50 or higher ([place]) 07/27/2009    Past Surgical History:  Procedure Laterality Date  . CESAREAN SECTION    . TONSILLECTOMY      OB History    Gravida  1   Para  1   Term  1   Preterm      AB      Living        SAB      TAB      Ectopic      Multiple      Live Births                Home Medications    Prior to Admission medications   Medication Sig Start Date End Date Taking? Authorizing Provider  bismuth subsalicylate (PEPTO BISMOL) 262 MG/15ML suspension Take 30 mLs by mouth every 6 (six) hours as needed.   Yes [provider]  clobetasol ointment (TEMOVATE) 0.05 % APPLY TO AFFECTED AREAS ON HANDS TWICE DAILY UNTIL CLEAR, THEN AS NEEDED 07/14/19  Yes Delsa Grana, PA-C  Iron-Folic [id number] 150-1.25 MG TABS Take 1 tablet by mouth at bedtime. 03/04/19  Yes Delsa Grana, PA-C  nortriptyline (PAMELOR) 10 MG capsule TAKE 1 CAPSULE (10 MG TOTAL) BY MOUTH AT BEDTIME. 04/12/19  Yes Jaffe, Adam R, DO  pantoprazole (PROTONIX) 40 MG tablet TAKE 1 TABLET BY MOUTH EVERY DAY 05/31/19  Yes Delsa Grana, PA-C  SUMAtriptan (IMITREX) 100 MG tablet Take 1 tablet earliest onset of migraine.  May repeat in 2 hours if headache persists or recurs.  Maximum 2 tablets in 24 hours 08/05/19  Yes Jaffe, Adam R, DO  Vitamin D, Ergocalciferol, (DRISDOL) 1.25 MG (50000 UNIT) CAPS capsule Take 1 capsule (50,000 Units total) by mouth every 7 (seven) days. x12 weeks. 07/20/19  Yes Delsa Grana,  PA-C  ketorolac (TORADOL) 10 MG tablet Take 1 tablet (10 mg total) by mouth every 6 (six) hours as needed for moderate pain or severe pain. 09/06/19   Coral Spikes, DO    Family History Family History  Problem Relation Age of Onset  . Hypertension Mother   . Depression Mother   . Hyperlipidemia Mother   . Other Father        unknown medical history  . Depression Sister   . Anxiety disorder Sister   . Anxiety disorder Brother   . Eczema Daughter   . COPD Maternal Grandmother   . Emphysema Maternal Grandmother   . Cancer Maternal Grandfather        stomach cancer    Social History Social History   Tobacco Use  . Smoking status: Never Smoker  . Smokeless tobacco: Never Used  Substance Use Topics  . Alcohol use: Yes    Comment: occassional - 3 glasses wine/month  . Drug use: No      Allergies   Patient has no known allergies.   Review of Systems Review of Systems  Gastrointestinal: Positive for constipation and nausea.  Genitourinary: Positive for flank pain.   Physical Exam Triage Vital Signs ED Triage Vitals  Enc Vitals Group     BP 09/06/19 1444 124/77     Pulse Rate 09/06/19 1444 (!) 102     Resp 09/06/19 1444 18     Temp 09/06/19 1444 98.5 F (36.9 C)     Temp Source 09/06/19 1444 Oral     SpO2 09/06/19 1444 100 %     Weight 09/06/19 1444 268 lb (121.6 kg)     Height 09/06/19 1444 5\' 1"  (1.549 m)     Head Circumference --      Peak Flow --      Pain Score 09/06/19 1443 6     Pain Loc --      Pain Edu? --      Excl. in [place]? --    Updated Vital Signs BP 124/77 (BP Location: Left Arm)   Pulse (!) 102   Temp 98.5 F (36.9 C) (Oral)   Resp 18   Ht 5\' 1"  (1.549 m)   Wt 121.6 kg   LMP 08/10/2019 (Exact Date) Comment: neg preg test today  SpO2 100%   BMI 50.64 kg/m   Visual Acuity Right Eye Distance:   Left Eye Distance:   Bilateral Distance:    Right Eye Near:   Left Eye Near:    Bilateral Near:     Physical Exam Vitals and nursing note reviewed.  Constitutional:      General: She is not in acute distress.    Appearance: Normal appearance. She is obese. She is not ill-appearing.  HENT:     Head: Normocephalic and atraumatic.  Eyes:     General:        Right eye: No discharge.        Left eye: No discharge.     Conjunctiva/sclera: Conjunctivae normal.  Cardiovascular:     Rate and Rhythm: Normal rate and regular rhythm.     Heart sounds: No murmur.  Pulmonary:     Effort: Pulmonary effort is normal.     Breath sounds: Normal breath sounds. No wheezing, rhonchi or rales.  Abdominal:     General: There is no distension.     Palpations: Abdomen is soft.     Tenderness: There is no abdominal tenderness.  Neurological:  Mental Status: She is alert.  Psychiatric:        Mood and Affect: Mood normal.        Behavior:  Behavior normal.    UC Treatments / Results  Labs (all labs ordered are listed, but only abnormal results are displayed) Labs Reviewed  URINALYSIS, COMPLETE (UACMP) WITH MICROSCOPIC - Abnormal; Notable for the following components:      Result Value   APPearance HAZY (*)    Bilirubin Urine SMALL (*)    Protein, ur 30 (*)    Leukocytes,Ua LARGE (*)    Bacteria, UA FEW (*)    All other components within normal limits  URINE CULTURE  PREGNANCY, URINE    EKG   Radiology DG Abd 1 View  Result Date: 09/06/2019 CLINICAL DATA:  Acute left flank pain. EXAM: ABDOMEN - 1 VIEW COMPARISON:  None. FINDINGS: The bowel gas pattern is normal. No radio-opaque calculi or other significant radiographic abnormality are seen. IMPRESSION: Negative. Electronically Signed   By: Marijo Conception M.D.   On: 09/06/2019 15:48    Procedures Procedures (including critical care time)  Medications Ordered in UC Medications - No data to display  Initial Impression / Assessment and Plan / UC Course  I have reviewed the triage vital signs and the nursing notes.  Pertinent labs & imaging results that were available during my care of the patient were reviewed by me and considered in my medical decision making (see chart for details).    34 year old female presents with left flank pain.  Urinalysis not consistent with UTI.  Sending culture.  Pregnancy test negative.  Abdominal exam benign.  No evidence of acute abdomen.  KUB obtained and independently reviewed by me.  Interpretation: No acute abnormalities.  No evidence of obstruction or nephrolithiasis.  Suspect musculoskeletal in nature.  Could still be related to stone but unlikely in the setting of constant pain and no evidence of hematuria on urine microscopy.  Toradol as needed.  Follow-up if persists or worsens.  Final Clinical Impressions(s) / UC Diagnoses   Final diagnoses:  Flank pain     Discharge Instructions     Xray normal.  Medication as  directed.  If persists or worsens, please see your PCP.  Take care  Dr. Lacinda Axon     ED Prescriptions    Medication Sig Dispense Auth. Provider   ketorolac (TORADOL) 10 MG tablet Take 1 tablet (10 mg total) by mouth every 6 (six) hours as needed for moderate pain or severe pain. 20 tablet Coral Spikes, DO     PDMP not reviewed this encounter.   Coral Spikes, [place] 09/06/19 1609

## 2019-09-08 ENCOUNTER — Other Ambulatory Visit: Payer: Self-pay

## 2019-09-08 ENCOUNTER — Encounter: Payer: Self-pay | Admitting: Family Medicine

## 2019-09-08 ENCOUNTER — Emergency Department: Payer: BC Managed Care – PPO

## 2019-09-08 ENCOUNTER — Encounter: Payer: Self-pay | Admitting: Emergency Medicine

## 2019-09-08 ENCOUNTER — Emergency Department
Admission: EM | Admit: 2019-09-08 | Discharge: 2019-09-08 | Disposition: A | Discharge status: Home / Self Care | Payer: BC Managed Care – PPO | Attending: Emergency Medicine | Admitting: Emergency Medicine

## 2019-09-08 DIAGNOSIS — M4310 Spondylolisthesis, site unspecified: Secondary | ICD-10-CM

## 2019-09-08 DIAGNOSIS — M4316 Spondylolisthesis, lumbar region: Secondary | ICD-10-CM | POA: Insufficient documentation

## 2019-09-08 DIAGNOSIS — Z79899 Other long term (current) drug therapy: Secondary | ICD-10-CM | POA: Insufficient documentation

## 2019-09-08 DIAGNOSIS — R109 Unspecified abdominal pain: Secondary | ICD-10-CM | POA: Diagnosis present

## 2019-09-08 DIAGNOSIS — M47816 Spondylosis without myelopathy or radiculopathy, lumbar region: Secondary | ICD-10-CM | POA: Diagnosis not present

## 2019-09-08 LAB — POCT PREGNANCY, URINE: Preg Test, Ur: NEGATIVE

## 2019-09-08 LAB — URINALYSIS, COMPLETE (UACMP) WITH MICROSCOPIC
Bacteria, UA: NONE SEEN
Bilirubin Urine: NEGATIVE
Glucose, UA: NEGATIVE mg/dL
Hgb urine dipstick: NEGATIVE
Ketones, ur: NEGATIVE mg/dL
Nitrite: NEGATIVE
Protein, ur: NEGATIVE mg/dL
Specific Gravity, Urine: 1.015 (ref 1.005–1.030)
pH: 6 (ref 5.0–8.0)

## 2019-09-08 LAB — COMPREHENSIVE METABOLIC PANEL
ALT: 10 U/L (ref 0–44)
AST: 14 U/L — ABNORMAL LOW (ref 15–41)
Albumin: 3.7 g/dL (ref 3.5–5.0)
Alkaline Phosphatase: 73 U/L (ref 38–126)
Anion gap: 6 (ref 5–15)
BUN: 11 mg/dL (ref 6–20)
CO2: 25 mmol/L (ref 22–32)
Calcium: 8.9 mg/dL (ref 8.9–10.3)
Chloride: 105 mmol/L (ref 98–111)
Creatinine, Ser: 0.72 mg/dL (ref 0.44–1.00)
GFR calc Af Amer: >60 mL/min (ref >60)
GFR calc non Af Amer: >60 mL/min (ref >60)
Glucose, Bld: 102 mg/dL — ABNORMAL HIGH (ref 70–99)
Potassium: 3.9 mmol/L (ref 3.5–5.1)
Sodium: 136 mmol/L (ref 135–145)
Total Bilirubin: 0.4 mg/dL (ref 0.3–1.2)
Total Protein: 8.1 g/dL (ref 6.5–8.1)

## 2019-09-08 LAB — URINE CULTURE: Culture: NO GROWTH

## 2019-09-08 LAB — CBC WITH DIFFERENTIAL/PLATELET
Abs Immature Granulocytes: 0.03 10*3/uL (ref 0.00–0.07)
Basophils Absolute: 0 10*3/uL (ref 0.0–0.1)
Basophils Relative: 0 %
Eosinophils Absolute: 0.1 10*3/uL (ref 0.0–0.5)
Eosinophils Relative: 1 %
HCT: 35.3 % — ABNORMAL LOW (ref 36.0–46.0)
Hemoglobin: 10.4 g/dL — ABNORMAL LOW (ref 12.0–15.0)
Immature Granulocytes: 0 %
Lymphocytes Relative: 35 %
Lymphs Abs: 3.7 10*3/uL (ref 0.7–4.0)
MCH: 20.4 pg — ABNORMAL LOW (ref 26.0–34.0)
MCHC: 29.5 g/dL — ABNORMAL LOW (ref 30.0–36.0)
MCV: 69.1 fL — ABNORMAL LOW (ref 80.0–100.0)
Monocytes Absolute: 0.7 10*3/uL (ref 0.1–1.0)
Monocytes Relative: 7 %
Neutro Abs: 6 10*3/uL (ref 1.7–7.7)
Neutrophils Relative %: 57 %
Platelets: 440 10*3/uL — ABNORMAL HIGH (ref 150–400)
RBC: 5.11 MIL/uL (ref 3.87–5.11)
RDW: 19.9 % — ABNORMAL HIGH (ref 11.5–15.5)
Smear Review: NORMAL
WBC: 10.6 10*3/uL — ABNORMAL HIGH (ref 4.0–10.5)
nRBC: 0 % (ref 0.0–0.2)

## 2019-09-08 MED ORDER — KETOROLAC TROMETHAMINE 30 MG/ML IJ SOLN
30.0000 mg | Freq: Once | INTRAMUSCULAR | Status: AC
  Administered 2019-09-08: 30 mg via INTRAMUSCULAR
  Filled 2019-09-08: qty 1

## 2019-09-08 MED ORDER — HYDROCODONE-ACETAMINOPHEN 5-325 MG PO TABS: 1.0000 | ORAL_TABLET | Freq: Four times a day (QID) | ORAL | 0 refills | Status: DC | PRN

## 2019-09-08 MED ORDER — PREDNISONE 10 MG PO TABS
ORAL_TABLET | ORAL | 0 refills | Status: DC
Start: 2019-09-08 — End: 2019-11-01

## 2019-09-08 MED ORDER — METHOCARBAMOL 500 MG PO TABS: 500.0000 mg | ORAL_TABLET | Freq: Four times a day (QID) | ORAL | 0 refills | Status: DC | PRN

## 2019-09-08 NOTE — ED Triage Notes (Signed)
Patient ambulatory to triage with steady gait, without difficulty or distress noted; pt reports left flank pain since Friday, nonradiating; seen at Urgent Care on Tuesday after speaking with PCP; st pain began after recent constipation that was relieved by OTC

## 2019-09-08 NOTE — ED Notes (Signed)
See triage note. Presents with left flank pain  States pain started last weekend  Was seen by PCP and then UC  Was placed on tramadol for pain  No relief with meds  Denies any fever

## 2019-09-08 NOTE — ED Provider Notes (Signed)
[facility] Emergency Department Provider Note   ____________________________________________   First MD Initiated Contact with Patient 09/08/19 [phone]     (approximate)  I have reviewed the triage vital signs and the nursing notes.   HISTORY  Chief Complaint Flank Pain   HPI Sherri Robertson is a 34 y.o. female presents to the ED with complaint of left flank pain.  Patient states that started last weekend and was seen by her PCP and then by urgent care.  Patient states she was placed on tramadol for pain.  She states pain began while she was constipated.  Patient states that she took over-the-counter medication and had relief of her constipation.  She was seen at urgent care and urinalysis and physical exam was unremarkable.  Patient was told to be seen in the ED if she has continued problems.  Patient denies any fever, chills, nausea or vomiting.  There is no history of previous kidney stones.  She denies any injury to her back.  She rates her pain as a 10/10.    Past Medical History:  Diagnosis Date  . Abnormal cervical Pap smear with positive HPV DNA test 01/26/2015  . Anemia   . GERD (gastroesophageal reflux disease)   . Low back pain 01/26/2015  . Migraine     Patient Active Problem List   Diagnosis Date Noted  . Migraine without aura and without status migrainosus, not intractable 12/01/2018  . Gastroesophageal reflux disease 12/01/2018  . Prediabetes 02/05/2017  . 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 Thyroid nodule 01/22/2017  . Vitamin D deficiency 01/22/2017  . Acanthosis nigricans 08/05/2015  . Anxiety and depression 01/26/2015  . Dysmenorrhea 01/26/2015  . Fibroid 01/26/2015  . Anemia, iron deficiency 09/21/2009  . Obesity, morbid, BMI 50 or higher ([place]) 07/27/2009    Past Surgical History:  Procedure Laterality Date  . CESAREAN SECTION    . TONSILLECTOMY      Prior to Admission medications   Medication Sig Start Date End Date Taking?  Authorizing Provider  bismuth subsalicylate (PEPTO BISMOL) 262 MG/15ML suspension Take 30 mLs by mouth every 6 (six) hours as needed.   Yes [provider]  clobetasol ointment (TEMOVATE) 0.05 % APPLY TO AFFECTED AREAS ON HANDS TWICE DAILY UNTIL CLEAR, THEN AS NEEDED 07/14/19  Yes Delsa Grana, PA-C  Iron-Folic [id number] 150-1.25 MG TABS Take 1 tablet by mouth at bedtime. 03/04/19  Yes Delsa Grana, PA-C  nortriptyline (PAMELOR) 10 MG capsule TAKE 1 CAPSULE (10 MG TOTAL) BY MOUTH AT BEDTIME. 04/12/19  Yes Jaffe, Adam R, DO  omeprazole (PRILOSEC) 20 MG capsule Take 20 mg by mouth daily. 07/20/19  Yes [provider]  pantoprazole (PROTONIX) 40 MG tablet TAKE 1 TABLET BY MOUTH EVERY DAY 05/31/19  Yes Delsa Grana, PA-C  rizatriptan (MAXALT) 10 MG tablet Take 10 mg by mouth as needed for migraine. May repeat in 2 hours if needed   Yes [provider]  topiramate (TOPAMAX) 50 MG tablet Take 50 mg by mouth at bedtime. 07/20/19  Yes [provider]  HYDROcodone-acetaminophen (NORCO/VICODIN) 5-325 MG tablet Take 1 tablet by mouth every 6 (six) hours as needed for moderate pain. 09/08/19   Johnn Hai, PA-C  methocarbamol (ROBAXIN) 500 MG tablet Take 1 tablet (500 mg total) by mouth every 6 (six) hours as needed for muscle spasms. 09/08/19   Johnn Hai, PA-C  predniSONE (DELTASONE) 10 MG tablet Take 6 tablets  today, on day 2 take 5 tablets,  day 3 take 4 tablets, day 4 take 3 tablets, day 5 take  2 tablets and 1 tablet the last day 09/08/19   Johnn Hai, PA-C  SUMAtriptan (IMITREX) 100 MG tablet Take 1 tablet earliest onset of migraine.  May repeat in 2 hours if headache persists or recurs.  Maximum 2 tablets in 24 hours 08/05/19   Pieter Partridge, DO  Vitamin D, Ergocalciferol, (DRISDOL) 1.25 MG (50000 UNIT) CAPS capsule Take 1 capsule (50,000 Units total) by mouth every 7 (seven) days. x12 weeks. 07/20/19   Delsa Grana, PA-C    Allergies Patient has no  known allergies.  Family History  Problem Relation Age of Onset  . Hypertension Mother   . Depression Mother   . Hyperlipidemia Mother   . Other Father        unknown medical history  . Depression Sister   . Anxiety disorder Sister   . Anxiety disorder Brother   . Eczema Daughter   . COPD Maternal Grandmother   . Emphysema Maternal Grandmother   . Cancer Maternal Grandfather        stomach cancer    Social History Social History   Tobacco Use  . Smoking status: Never Smoker  . Smokeless tobacco: Never Used  Substance Use Topics  . Alcohol use: Yes    Comment: occassional - 3 glasses wine/month  . Drug use: No    Review of Systems Constitutional: No fever/chills Eyes: No visual changes. ENT: No sore throat. Cardiovascular: Denies chest pain. Respiratory: Denies shortness of breath. Gastrointestinal: No abdominal pain.  No nausea, no vomiting.  No diarrhea.  Resolved constipation. Genitourinary: Negative for dysuria. Musculoskeletal: Positive for left flank pain. Skin: Negative for rash. Neurological: Negative for headaches, focal weakness or numbness.  ____________________________________________   PHYSICAL EXAM:  VITAL SIGNS: ED Triage Vitals  Enc Vitals Group     BP 09/08/19 0450 102/76     Pulse Rate 09/08/19 0450 95     Resp 09/08/19 0450 18     Temp 09/08/19 0450 98.5 F (36.9 C)     Temp Source 09/08/19 0450 Oral     SpO2 09/08/19 0450 100 %     Weight 09/08/19 0451 268 lb (121.6 kg)     Height 09/08/19 0451 5\' 1"  (1.549 m)     Head Circumference --      Peak Flow --      Pain Score 09/08/19 0457 10     Pain Loc --      Pain Edu? --      Excl. in [place]? --    Constitutional: Alert and oriented. Well appearing and in no acute distress. Eyes: Conjunctivae are normal. PERRL. EOMI. Head: Atraumatic. Neck: No stridor.   Cardiovascular: Normal rate, regular rhythm. Grossly normal heart sounds.  Good peripheral circulation. Respiratory: Normal  respiratory effort.  No retractions. Lungs CTAB. Gastrointestinal: Soft and nontender. No distention.  No CVA tenderness. Musculoskeletal: Nontender thoracic spine to palpation posteriorly.  Palpation of the lumbar spine there is no point tenderness however there is moderate tenderness on palpation of the left paravertebral muscles.  This is not increased with range of motion no muscle spasms were noted.  No step-offs are appreciated.  Good muscle strength bilaterally.  Patient is able to ambulate without any assistance. Neurologic:  Normal speech and language.  Reflexes are 2+ bilaterally.  No gross focal neurologic deficits are appreciated. No gait instability. Skin:  Skin is warm, dry and intact. No rash noted.  Psychiatric: Mood and affect are normal. Speech and behavior are normal.  ____________________________________________   LABS (all labs ordered are listed, but only abnormal results are displayed)  Labs Reviewed  CBC WITH DIFFERENTIAL/PLATELET - Abnormal; Notable for the following components:      Result Value   WBC 10.6 (*)    Hemoglobin 10.4 (*)    HCT 35.3 (*)    MCV 69.1 (*)    MCH 20.4 (*)    MCHC 29.5 (*)    RDW 19.9 (*)    Platelets 440 (*)    All other components within normal limits  COMPREHENSIVE METABOLIC PANEL - Abnormal; Notable for the following components:   Glucose, Bld 102 (*)    AST 14 (*)    All other components within normal limits  URINALYSIS, COMPLETE (UACMP) WITH MICROSCOPIC - Abnormal; Notable for the following components:   Color, Urine YELLOW (*)    APPearance CLEAR (*)    Leukocytes,Ua SMALL (*)    All other components within normal limits  POCT PREGNANCY, URINE   RADIOLOGY  Official radiology report(s): DG Lumbar Spine 2-3 Views  Result Date: 09/08/2019 CLINICAL DATA:  LEFT flank pain since Friday, nonradiating, recent constipation, no injury EXAM: LUMBAR SPINE - 2-3 VIEW COMPARISON:  02/15/2010 FINDINGS: 5 non-rib-bearing lumbar vertebra.  Osseous mineralization normal. Vertebral body and disc space heights maintained. 11 mm anterolisthesis L5-S1 secondary to BILATERAL spondylolysis L5, increased from 8 mm. No fracture, additional subluxation or bone destruction. SI joints preserved. IMPRESSION: Grade 2 spondylolisthesis L5-S1 secondary to BILATERAL spondylolysis L5 increased since 2011. Electronically Signed   By: Lavonia Dana M.D.   On: 09/08/2019 09:38   CT Renal Stone Study  Result Date: 09/08/2019 CLINICAL DATA:  Left flank pain, kidney stones suspected EXAM: CT ABDOMEN AND PELVIS WITHOUT CONTRAST TECHNIQUE: Multidetector CT imaging of the abdomen and pelvis was performed following the standard protocol without IV contrast. COMPARISON:  None. FINDINGS: Lower chest: No acute abnormality. Hepatobiliary: No solid liver abnormality is seen. No gallstones, gallbladder wall thickening, or biliary dilatation. Pancreas: Unremarkable. No pancreatic ductal dilatation or surrounding inflammatory changes. Spleen: Normal in size without significant abnormality. Adrenals/Urinary Tract: Adrenal glands are unremarkable. Kidneys are normal, without renal calculi, solid lesion, or hydronephrosis. Bladder is unremarkable. Stomach/Bowel: Stomach is within normal limits. Appendix appears normal. No evidence of bowel wall thickening, distention, or inflammatory changes. Vascular/Lymphatic: No significant vascular findings are present. No enlarged abdominal or pelvic lymph nodes. Reproductive: No mass or other significant abnormality. Other: No abdominal wall hernia or abnormality. Trace fluid in the low pelvis, likely functional in the reproductive age setting. Musculoskeletal: Bilateral pars defects of L5 with focal disc space height loss and minimal degenerative anterolisthesis of L5 on S1. IMPRESSION: 1. No evidence of urinary tract calculus or hydronephrosis. 2. Trace fluid in the low pelvis, likely functional in the reproductive age setting. 3. Bilateral pars  defects of L5. Electronically Signed   By: Eddie Candle M.D.   On: 09/08/2019 11:06    ____________________________________________   PROCEDURES  Procedure(s) performed (including Critical Care):  Procedures   ____________________________________________   INITIAL IMPRESSION / ASSESSMENT AND PLAN / ED COURSE  As part of my medical decision making, I reviewed the following data within the electronic MEDICAL RECORD NUMBER Notes from prior ED visits and [place] Controlled Substance Database  34 year old female presents to the ED with complaint of left flank pain that started 6 days ago.  Patient states it is nonradiating and she has not had  any saddle anesthesias or incontinence of bowel or bladder.  Patient was seen at urgent care and was told to be seen at the ED if any continued problems.  Patient had a recent history of constipation which is now resolved with over-the-counter medication.  Patient denies any injury to her back however she continues to have some left lateral muscular pain which is increased with range of motion.  She is unaware of any known injury.  Physical exam is suspicious for muscle skeletal pain.  Lumbar spine x-rays show grade 2 spondylolithiasis at L5-S1 area and spondylosis of the lumbar spine.  CT scan was negative for renal stones.  Patient agreed to have Toradol 30 mg IM after coming back from having her CT scan.  While waiting for the results patient was able to go to sleep.  Prior to discharge she states that her back is feeling much better.  Patient was reassured.  She was given a prescription for prednisone 60 mg 6-day taper, methocarbamol 500 mg every 6 hours as needed for muscle spasms and Norco if needed for severe pain.  She is encouraged to use ice or heat to her back as needed for discomfort and follow-up with her PCP if any continued problems.   ____________________________________________   FINAL CLINICAL IMPRESSION(S) / ED DIAGNOSES  Final diagnoses:    Spondylolisthesis, grade 2  Spondylosis of lumbar spine     ED Discharge Orders         Ordered    predniSONE (DELTASONE) 10 MG tablet     09/08/19 1015    methocarbamol (ROBAXIN) 500 MG tablet  Every 6 hours PRN     09/08/19 1015    HYDROcodone-acetaminophen (NORCO/VICODIN) 5-325 MG tablet  Every 6 hours PRN     09/08/19 1015           Note:  This document was prepared using Dragon voice recognition software and may include unintentional dictation errors.    Johnn Hai, PA-C 09/08/19 1629    Vanessa [place], MD 09/08/19 [phone]

## 2019-10-13 DIAGNOSIS — U071 COVID-19: Secondary | ICD-10-CM | POA: Insufficient documentation

## 2019-11-01 ENCOUNTER — Encounter: Payer: Self-pay | Admitting: Emergency Medicine

## 2019-11-01 ENCOUNTER — Ambulatory Visit: Payer: BC Managed Care – PPO | Admitting: Family Medicine

## 2019-11-01 ENCOUNTER — Emergency Department: Payer: BC Managed Care – PPO

## 2019-11-01 ENCOUNTER — Encounter: Payer: Self-pay | Admitting: Family Medicine

## 2019-11-01 ENCOUNTER — Other Ambulatory Visit: Payer: Self-pay

## 2019-11-01 ENCOUNTER — Emergency Department
Admission: EM | Admit: 2019-11-01 | Discharge: 2019-11-01 | Disposition: A | Discharge status: Home / Self Care | Payer: BC Managed Care – PPO | Attending: Emergency Medicine | Admitting: Emergency Medicine

## 2019-11-01 ENCOUNTER — Telehealth (INDEPENDENT_AMBULATORY_CARE_PROVIDER_SITE_OTHER): Payer: BC Managed Care – PPO | Admitting: Family Medicine

## 2019-11-01 VITALS — Temp 100.0°F | Ht 62.0 in | Wt 256.0 lb

## 2019-11-01 DIAGNOSIS — J1282 Pneumonia due to coronavirus disease 2019: Secondary | ICD-10-CM

## 2019-11-01 DIAGNOSIS — U071 COVID-19: Secondary | ICD-10-CM

## 2019-11-01 DIAGNOSIS — G43009 Migraine without aura, not intractable, without status migrainosus: Secondary | ICD-10-CM | POA: Diagnosis not present

## 2019-11-01 DIAGNOSIS — Z0289 Encounter for other administrative examinations: Secondary | ICD-10-CM

## 2019-11-01 DIAGNOSIS — R079 Chest pain, unspecified: Secondary | ICD-10-CM | POA: Diagnosis present

## 2019-11-01 LAB — BASIC METABOLIC PANEL
Anion gap: 6 (ref 5–15)
BUN: 9 mg/dL (ref 6–20)
CO2: 25 mmol/L (ref 22–32)
Calcium: 8.6 mg/dL — ABNORMAL LOW (ref 8.9–10.3)
Chloride: 105 mmol/L (ref 98–111)
Creatinine, Ser: 0.69 mg/dL (ref 0.44–1.00)
GFR calc Af Amer: >60 mL/min (ref >60)
GFR calc non Af Amer: >60 mL/min (ref >60)
Glucose, Bld: 112 mg/dL — ABNORMAL HIGH (ref 70–99)
Potassium: 3.6 mmol/L (ref 3.5–5.1)
Sodium: 136 mmol/L (ref 135–145)

## 2019-11-01 LAB — CBC
HCT: 36.6 % (ref 36.0–46.0)
Hemoglobin: 10.9 g/dL — ABNORMAL LOW (ref 12.0–15.0)
MCH: 20.5 pg — ABNORMAL LOW (ref 26.0–34.0)
MCHC: 29.8 g/dL — ABNORMAL LOW (ref 30.0–36.0)
MCV: 68.8 fL — ABNORMAL LOW (ref 80.0–100.0)
Platelets: 400 10*3/uL (ref 150–400)
RBC: 5.32 MIL/uL — ABNORMAL HIGH (ref 3.87–5.11)
RDW: 20.8 % — ABNORMAL HIGH (ref 11.5–15.5)
WBC: 8.5 10*3/uL (ref 4.0–10.5)
nRBC: 0 % (ref 0.0–0.2)

## 2019-11-01 LAB — POCT PREGNANCY, URINE: Preg Test, Ur: NEGATIVE

## 2019-11-01 LAB — TROPONIN I (HIGH SENSITIVITY): Troponin I (High Sensitivity): <2 ng/L (ref <18)

## 2019-11-01 MED ORDER — PREDNISONE 20 MG PO TABS: 40.0000 mg | ORAL_TABLET | Freq: Every day | ORAL | 0 refills | Status: AC

## 2019-11-01 MED ORDER — AZITHROMYCIN 250 MG PO TABS: ORAL_TABLET | ORAL | 0 refills | Status: AC

## 2019-11-01 NOTE — ED Triage Notes (Signed)
Patient states she was diagnosed with COVID on 7/15. Reports for the past few days, she has had heaviness across chest. Also complaining of body aches and congestion. Reports having e-visit and was encouraged to be seen in person due to symptoms.

## 2019-11-01 NOTE — ED Provider Notes (Signed)
[facility] Emergency Department Provider Note  ____________________________________________   First MD Initiated Contact with Patient 11/01/19 1646     (approximate)  I have reviewed the triage vital signs and the nursing notes.   HISTORY  Chief Complaint COVID-19    HPI Sherri Robertson is a 34 y.o. female with known Covid comes in for evaluation.  Patient was diagnosed with Covid on 7/15.  Patient reports a heaviness across her chest complaining of body aches and congestion.  Patient states that her symptoms really started on the 15th.  She had maybe a mild cough the day prior.  She reports that her biggest concern is the chest discomfort this been going on for the past few days, constant, nothing makes it better, nothing makes it worse.  She is taking all of the over-the-counter viral medicines.  She reports worsening shortness of breath mostly with moving around.  With a cough.  She denies any other risk factors for PE.  She is not on estrogen.  Although she is not eating food she is been drinking plenty of fluids.  She denies any abdominal pain.  A little bit of nausea.  No vomiting.  No pain with urination    Past Medical History:  Diagnosis Date  . Abnormal cervical Pap smear with positive HPV DNA test 01/26/2015  . Anemia   . GERD (gastroesophageal reflux disease)   . Low back pain 01/26/2015  . Migraine     Patient Active Problem List   Diagnosis Date Noted  . Migraine without aura and without status migrainosus, not intractable 12/01/2018  . Gastroesophageal reflux disease 12/01/2018  . Prediabetes 02/05/2017  . 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 Thyroid nodule 01/22/2017  . Vitamin D deficiency 01/22/2017  . Acanthosis nigricans 08/05/2015  . Anxiety and depression 01/26/2015  . Dysmenorrhea 01/26/2015  . Fibroid 01/26/2015  . Anemia, iron deficiency 09/21/2009  . Obesity, morbid, BMI 50 or higher ([place]) 07/27/2009    Past Surgical  History:  Procedure Laterality Date  . CESAREAN SECTION    . TONSILLECTOMY      Prior to Admission medications   Medication Sig Start Date End Date Taking? Authorizing Provider  bismuth subsalicylate (PEPTO BISMOL) 262 MG/15ML suspension Take 30 mLs by mouth every 6 (six) hours as needed. Patient not taking: Reported on 11/01/2019    [provider]  clobetasol ointment (TEMOVATE) 0.05 % APPLY TO AFFECTED AREAS ON HANDS TWICE DAILY UNTIL CLEAR, THEN AS NEEDED Patient not taking: Reported on 11/01/2019 07/14/19   Delsa Grana, PA-C  HYDROcodone-acetaminophen (NORCO/VICODIN) 5-325 MG tablet Take 1 tablet by mouth every 6 (six) hours as needed for moderate pain. Patient not taking: Reported on 11/01/2019 09/08/19   Johnn Hai, PA-C  Iron-Folic [id number] 150-1.25 MG TABS Take 1 tablet by mouth at bedtime. Patient not taking: Reported on 11/01/2019 03/04/19   Delsa Grana, PA-C  methocarbamol (ROBAXIN) 500 MG tablet Take 1 tablet (500 mg total) by mouth every 6 (six) hours as needed for muscle spasms. Patient not taking: Reported on 11/01/2019 09/08/19   Johnn Hai, PA-C  naproxen (NAPROSYN) 500 MG tablet Take 500 mg by mouth 2 (two) times daily. Patient not taking: Reported on 11/01/2019 09/08/19   [provider]  nortriptyline (PAMELOR) 10 MG capsule TAKE 1 CAPSULE (10 MG TOTAL) BY MOUTH AT BEDTIME. Patient not taking: Reported on 11/01/2019 04/12/19   Pieter Partridge, DO  omeprazole (PRILOSEC) 20 MG capsule Take 20 mg by  mouth daily. Patient not taking: Reported on 11/01/2019 07/20/19   [provider]  pantoprazole (PROTONIX) 40 MG tablet TAKE 1 TABLET BY MOUTH EVERY DAY Patient not taking: Reported on 11/01/2019 05/31/19   Delsa Grana, PA-C  phentermine (ADIPEX-P) 37.5 MG tablet Take 37.5 mg by mouth daily. Patient not taking: Reported on 11/01/2019 08/09/19   [provider]  predniSONE (DELTASONE) 10 MG tablet Take 6 tablets  today, on day 2 take  5 tablets, day 3 take 4 tablets, day 4 take 3 tablets, day 5 take  2 tablets and 1 tablet the last day Patient not taking: Reported on 11/01/2019 09/08/19   Johnn Hai, PA-C  rizatriptan (MAXALT) 10 MG tablet Take 10 mg by mouth as needed for migraine. May repeat in 2 hours if needed Patient not taking: Reported on 11/01/2019    [provider]  SUMAtriptan (IMITREX) 100 MG tablet Take 1 tablet earliest onset of migraine.  May repeat in 2 hours if headache persists or recurs.  Maximum 2 tablets in 24 hours Patient not taking: Reported on 11/01/2019 08/05/19   Pieter Partridge, DO  topiramate (TOPAMAX) 50 MG tablet Take 50 mg by mouth at bedtime. Patient not taking: Reported on 11/01/2019 07/20/19   [provider]  Vitamin D, Ergocalciferol, (DRISDOL) 1.25 MG (50000 UNIT) CAPS capsule Take 1 capsule (50,000 Units total) by mouth every 7 (seven) days. x12 weeks. Patient not taking: Reported on 11/01/2019 07/20/19   Delsa Grana, PA-C    Allergies Patient has no known allergies.  Family History  Problem Relation Age of Onset  . Hypertension Mother   . Depression Mother   . Hyperlipidemia Mother   . Other Father        unknown medical history  . Depression Sister   . Anxiety disorder Sister   . Anxiety disorder Brother   . Eczema Daughter   . COPD Maternal Grandmother   . Emphysema Maternal Grandmother   . Cancer Maternal Grandfather        stomach cancer    Social History Social History   Tobacco Use  . Smoking status: Never Smoker  . Smokeless tobacco: Never Used  Vaping Use  . Vaping Use: Never used  Substance Use Topics  . Alcohol use: Yes    Comment: occassional - 3 glasses wine/month  . Drug use: No      Review of Systems Constitutional: No fever/chills Eyes: No visual changes. ENT: No sore throat.  Congestion, loss of taste and smell Cardiovascular: Positive chest pain Respiratory: Denies shortness of breath. Gastrointestinal: No abdominal pain.   Positive nausea, no vomiting.  No diarrhea.  No constipation. Genitourinary: Negative for dysuria. Musculoskeletal: Negative for back pain. Skin: Negative for rash. Neurological: Negative for headaches, focal weakness or numbness. All other ROS negative ____________________________________________   PHYSICAL EXAM:  VITAL SIGNS: ED Triage Vitals  Enc Vitals Group     BP 11/01/19 1343 (!) 133/96     Pulse Rate 11/01/19 1343 (!) 101     Resp 11/01/19 1343 18     Temp 11/01/19 1343 98.5 F (36.9 C)     Temp Source 11/01/19 1343 Oral     SpO2 11/01/19 1343 99 %     Weight 11/01/19 1342 256 lb (116.1 kg)     Height 11/01/19 1342 5\' 2"  (1.575 m)     Head Circumference --      Peak Flow --      Pain Score 11/01/19 1352 6  Pain Loc --      Pain Edu? --      Excl. in [place]? --     Constitutional: Alert and oriented. Well appearing and in no acute distress. Eyes: Conjunctivae are normal. EOMI. Head: Atraumatic. Nose: No congestion/rhinnorhea. Mouth/Throat: Mucous membranes are moist.   Neck: No stridor. Trachea Midline. FROM Cardiovascular: Normal rate, regular rhythm. Grossly normal heart sounds.  Good peripheral circulation. Respiratory: Normal respiratory effort.  No retractions.  Speaking in full sentences Gastrointestinal: Soft and nontender. No distention. No abdominal bruits.  Musculoskeletal: No lower extremity tenderness nor edema.  No joint effusions. Neurologic:  Normal speech and language. No gross focal neurologic deficits are appreciated.  Skin:  Skin is warm, dry and intact. No rash noted. Psychiatric: Mood and affect are normal. Speech and behavior are normal. GU: Deferred   ____________________________________________   LABS (all labs ordered are listed, but only abnormal results are displayed)  Labs Reviewed  BASIC METABOLIC PANEL - Abnormal; Notable for the following components:      Result Value   Glucose, Bld 112 (*)    Calcium 8.6 (*)    All other  components within normal limits  CBC - Abnormal; Notable for the following components:   RBC 5.32 (*)    Hemoglobin 10.9 (*)    MCV 68.8 (*)    MCH 20.5 (*)    MCHC 29.8 (*)    RDW 20.8 (*)    All other components within normal limits  TROPONIN I (HIGH SENSITIVITY)  TROPONIN I (HIGH SENSITIVITY)   ____________________________________________   ED ECG REPORT I, Vanessa Lowden, the attending physician, personally viewed and interpreted this ECG.  Normal sinus rate of 90, no ST elevation to inversion in lead III, normal intervals ____________________________________________  RADIOLOGY Robert Bellow, personally viewed and evaluated these images (plain radiographs) as part of my medical decision making, as well as reviewing the written report by the radiologist.  ED MD interpretation: Chest x-ray no pneumonia  Official radiology report(s): DG Chest 2 View  Result Date: 11/01/2019 CLINICAL DATA:  Chest pain. EXAM: CHEST - 2 VIEW COMPARISON:  December 29, 2015 FINDINGS: The heart size and mediastinal contours are within normal limits. Both lungs are clear. The visualized skeletal structures are unremarkable. IMPRESSION: No active cardiopulmonary disease. Electronically Signed   By: Fidela Salisbury M.D.   On: 11/01/2019 14:57    ____________________________________________   PROCEDURES  Procedure(s) performed (including Critical Care):  Procedures   ____________________________________________   INITIAL IMPRESSION / ASSESSMENT AND PLAN / ED COURSE  Sherri Robertson was evaluated in Emergency Department on 11/01/2019 for the symptoms described in the history of present illness. She was evaluated in the context of the global COVID-19 pandemic, which necessitated consideration that the patient might be at risk for infection with the SARS-CoV-2 virus that causes COVID-19. Institutional protocols and algorithms that pertain to the evaluation of patients at risk for COVID-19 are  in a state of rapid change based on information released by regulatory bodies including the CDC and federal and state organizations. These policies and algorithms were followed during the patient's care in the ED.    Patient is a well-appearing 34 year old who comes in with Covid with worsening chest pain, shortness of breath.  Labs ordered to evaluate for Electra abnormalities, AKI, EKG to evaluate for ACS, cardiac markers evaluate for ACS.  Chest x-ray evaluate for Covid pneumonia, pleural effusions.   Labs are reassuring.  Kidney function normal.  No electrolyte  abnormalities Hemoglobin is at baseline Cardiac markers negative  Patient ambulated with saturations 99 to 100% and normal heart rates.  Discussed with patient the reassuring work-up.  Given her shortness of breath with Covid I suspect this is most likely related to Covid pneumonia just not being seen yet on chest x-ray.  We discussed different options including CT imaging to confirm Covid pneumonia and rule out PE.  My suspicion for PE is very low given she is not hypoxic the timeframe is consistent with Covid and she has no other risk factors for PE.  She does have an S1Q3T3 but is very nonspecific and I have seen multiple people who have had Covid have this with negative CT PEs.  At this time she would like to hold off on CT and I agree with that.  I suspect all of her symptoms are related to Covid.  She does not meet any admission criteria and feels comfortable and prefers to be able to go home.  Given that I do suspect she is Covid pneumonia we will give her a course of steroids and azithromycin to help with her symptoms and I explained to her that if her shortness of breath is getting any worse that she will need to return for repeat oxygen level test patient expressed understanding and felt comfortable with this plan   ____________________________________________   FINAL CLINICAL IMPRESSION(S) / ED DIAGNOSES   Final diagnoses:    Pneumonia due to COVID-19 virus      MEDICATIONS GIVEN DURING THIS VISIT:  Medications - No data to display   ED Discharge Orders         Ordered    azithromycin (ZITHROMAX Z-PAK) 250 MG tablet     Discontinue  Reprint     11/01/19 1825    predniSONE (DELTASONE) 20 MG tablet  Daily     Discontinue  Reprint     11/01/19 1825           Note:  This document was prepared using Dragon voice recognition software and may include unintentional dictation errors.   Vanessa Zoar, MD 11/01/19 [phone]

## 2019-11-01 NOTE — Discharge Instructions (Signed)
I suspect that this is all related to your Covid.  your heart markers show no evidence of heart attack or heart strain.  We will treat you as if this is Covid that is in your lungs although your chest x-ray really was reassuring.  We are going to give you some steroids and azithromycin to help with inflammation.  If your shortness of breath gets worse she should return to the ER immediately for recheck of your oxygen levels and decide if CT imaging is to be done at that time     Person Under Monitoring Name: Sherri Robertson  Location: [address] [place] [address]   Infection Prevention Recommendations for Individuals Confirmed to have, or Being Evaluated for, 2019 Novel Coronavirus (COVID-19) Infection Who Receive Care at Home  Individuals who are confirmed to have, or are being evaluated for, COVID-19 should follow the prevention steps below until a healthcare provider or local or state health department says they can return to normal activities.  Stay home except to get medical care You should restrict activities outside your home, except for getting medical care. Do not go to work, school, or public areas, and do not use public transportation or taxis.  Call ahead before visiting your doctor Before your medical appointment, call the healthcare provider and tell them that you have, or are being evaluated for, COVID-19 infection. This will help the healthcare provider's office take steps to keep other people from getting infected. Ask your healthcare provider to call the local or state health department.  Monitor your symptoms Seek prompt medical attention if your illness is worsening (e.g., difficulty breathing). Before going to your medical appointment, call the healthcare provider and tell them that you have, or are being evaluated for, COVID-19 infection. Ask your healthcare provider to call the local or state health department.  Wear a facemask You should wear a  facemask that covers your nose and mouth when you are in the same room with other people and when you visit a healthcare provider. People who live with or visit you should also wear a facemask while they are in the same room with you.  Separate yourself from other people in your home As much as possible, you should stay in a different room from other people in your home. Also, you should use a separate bathroom, if available.  Avoid sharing household items You should not share dishes, drinking glasses, cups, eating utensils, towels, bedding, or other items with other people in your home. After using these items, you should wash them thoroughly with soap and water.  Cover your coughs and sneezes Cover your mouth and nose with a tissue when you cough or sneeze, or you can cough or sneeze into your sleeve. Throw used tissues in a lined trash can, and immediately wash your hands with soap and water for at least 20 seconds or use an alcohol-based hand rub.  Wash your [facility] your hands often and thoroughly with soap and water for at least 20 seconds. You can use an alcohol-based hand sanitizer if soap and water are not available and if your hands are not visibly dirty. Avoid touching your eyes, nose, and mouth with unwashed hands.   Prevention Steps for Caregivers and Household Members of Individuals Confirmed to have, or Being Evaluated for, COVID-19 Infection Being Cared for in the Home  If you live with, or provide care at home for, a person confirmed to have, or being evaluated for,  COVID-19 infection please follow these guidelines to prevent infection:  Follow healthcare provider's instructions Make sure that you understand and can help the patient follow any healthcare provider instructions for all care.  Provide for the patient's basic needs You should help the patient with basic needs in the home and provide support for getting groceries, prescriptions, and other personal  needs.  Monitor the patient's symptoms If they are getting sicker, call his or her medical provider and tell them that the patient has, or is being evaluated for, COVID-19 infection. This will help the healthcare provider's office take steps to keep other people from getting infected. Ask the healthcare provider to call the local or state health department.  Limit the number of people who have contact with the patient If possible, have only one caregiver for the patient. Other household members should stay in another home or place of residence. If this is not possible, they should stay in another room, or be separated from the patient as much as possible. Use a separate bathroom, if available. Restrict visitors who do not have an essential need to be in the home.  Keep older adults, very young children, and other sick people away from the patient Keep older adults, very young children, and those who have compromised immune systems or chronic health conditions away from the patient. This includes people with chronic heart, lung, or kidney conditions, diabetes, and cancer.  Ensure good ventilation Make sure that shared spaces in the home have good air flow, such as from an air conditioner or an opened window, weather permitting.  Wash your hands often Wash your hands often and thoroughly with soap and water for at least 20 seconds. You can use an alcohol based hand sanitizer if soap and water are not available and if your hands are not visibly dirty. Avoid touching your eyes, nose, and mouth with unwashed hands. Use disposable paper towels to dry your hands. If not available, use dedicated cloth towels and replace them when they become wet.  Wear a facemask and gloves Wear a disposable facemask at all times in the room and gloves when you touch or have contact with the patient's blood, body fluids, and/or secretions or excretions, such as sweat, saliva, sputum, nasal mucus, vomit, urine, or  feces.  Ensure the mask fits over your nose and mouth tightly, and do not touch it during use. Throw out disposable facemasks and gloves after using them. Do not reuse. Wash your hands immediately after removing your facemask and gloves. If your personal clothing becomes contaminated, carefully remove clothing and launder. Wash your hands after handling contaminated clothing. Place all used disposable facemasks, gloves, and other waste in a lined container before disposing them with other household waste. Remove gloves and wash your hands immediately after handling these items.  Do not share dishes, glasses, or other household items with the patient Avoid sharing household items. You should not share dishes, drinking glasses, cups, eating utensils, towels, bedding, or other items with a patient who is confirmed to have, or being evaluated for, COVID-19 infection. After the person uses these items, you should wash them thoroughly with soap and water.  Wash laundry thoroughly Immediately remove and wash clothes or bedding that have blood, body fluids, and/or secretions or excretions, such as sweat, saliva, sputum, nasal mucus, vomit, urine, or feces, on them. Wear gloves when handling laundry from the patient. Read and follow directions on labels of laundry or clothing items and detergent. In general, wash and  dry with the warmest temperatures recommended on the label.  Clean all areas the individual has used often Clean all touchable surfaces, such as counters, tabletops, doorknobs, bathroom fixtures, toilets, phones, keyboards, tablets, and bedside tables, every day. Also, clean any surfaces that may have blood, body fluids, and/or secretions or excretions on them. Wear gloves when cleaning surfaces the patient has come in contact with. Use a diluted bleach solution (e.g., dilute bleach with 1 part bleach and 10 parts water) or a household disinfectant with a label that says EPA-registered for  coronaviruses. To make a bleach solution at home, add 1 tablespoon of bleach to 1 quart (4 cups) of water. For a larger supply, add  cup of bleach to 1 gallon (16 cups) of water. Read labels of cleaning products and follow recommendations provided on product labels. Labels contain instructions for safe and effective use of the cleaning product including precautions you should take when applying the product, such as wearing gloves or eye protection and making sure you have good ventilation during use of the product. Remove gloves and wash hands immediately after cleaning.  Monitor yourself for signs and symptoms of illness Caregivers and household members are considered close contacts, should monitor their health, and will be asked to limit movement outside of the home to the extent possible. Follow the monitoring steps for close contacts listed on the symptom monitoring form.   ? If you have additional questions, contact your local health department or call the epidemiologist on call at [phone] (available 24/7). ? This guidance is subject to change. For the most up-to-date guidance from [facility], please refer to their website: YouBlogs.pl

## 2019-11-01 NOTE — Progress Notes (Signed)
Name: Sherri Robertson   MRN: [id number]    DOB: [birth year]   Date:11/01/2019       Progress Note  Subjective:    Chief Complaint  Chief Complaint  Patient presents with  . COVID  . FMLA    I connected with  SAFIRE GORDIN  on 11/01/19 at 11:40 AM EDT by a video enabled telemedicine application and verified that I am speaking with the correct person using two identifiers.  I discussed the limitations of evaluation and management by telemedicine and the availability of in person appointments. The patient expressed understanding and agreed to proceed. Staff also discussed with the patient that there may be a patient responsible charge related to this service. Patient Location: home Provider Location: [facility] Additional Individuals present: none  HPI  Pt presents for FMLA for migraines- recert again - still seeing neurology, no change in management  One week ago she developed cry cough, got COVID tested on the 15th and it was positive, and she is still feeling ill.   She has fever, fatigue, ear pain, CP like "someone is sitting on her chest" she endorses severe fatigue, taking a shower wore her out, she does feel SOB, feels like she "has COPD"  No pulse ox or VS Pt laying in bed w/o any obvious distress but is very sleepy appearing.        Patient Active Problem List   Diagnosis Date Noted  . Migraine without aura and without status migrainosus, not intractable 12/01/2018  . Gastroesophageal reflux disease 12/01/2018  . Prediabetes 02/05/2017  . 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 Thyroid nodule 01/22/2017  . Vitamin D deficiency 01/22/2017  . Acanthosis nigricans 08/05/2015  . Anxiety and depression 01/26/2015  . Dysmenorrhea 01/26/2015  . Fibroid 01/26/2015  . Anemia, iron deficiency 09/21/2009  . Obesity, morbid, BMI 50 or higher ([place]) 07/27/2009    Social History   Tobacco Use  . Smoking status: Never Smoker  . Smokeless tobacco: Never Used  Substance Use  Topics  . Alcohol use: Yes    Comment: occassional - 3 glasses wine/month     Current Outpatient Medications:  .  bismuth subsalicylate (PEPTO BISMOL) 262 MG/15ML suspension, Take 30 mLs by mouth every 6 (six) hours as needed. (Patient not taking: Reported on 11/01/2019), Disp: , Rfl:  .  clobetasol ointment (TEMOVATE) 0.05 %, APPLY TO AFFECTED AREAS ON HANDS TWICE DAILY UNTIL CLEAR, THEN AS NEEDED (Patient not taking: Reported on 11/01/2019), Disp: 30 g, Rfl: 1 .  HYDROcodone-acetaminophen (NORCO/VICODIN) 5-325 MG tablet, Take 1 tablet by mouth every 6 (six) hours as needed for moderate pain. (Patient not taking: Reported on 11/01/2019), Disp: 12 tablet, Rfl: 0 .  Iron-Folic [id number] 150-1.25 MG TABS, Take 1 tablet by mouth at bedtime. (Patient not taking: Reported on 11/01/2019), Disp: 90 tablet, Rfl: 1 .  methocarbamol (ROBAXIN) 500 MG tablet, Take 1 tablet (500 mg total) by mouth every 6 (six) hours as needed for muscle spasms. (Patient not taking: Reported on 11/01/2019), Disp: 12 tablet, Rfl: 0 .  naproxen (NAPROSYN) 500 MG tablet, Take 500 mg by mouth 2 (two) times daily. (Patient not taking: Reported on 11/01/2019), Disp: , Rfl:  .  nortriptyline (PAMELOR) 10 MG capsule, TAKE 1 CAPSULE (10 MG TOTAL) BY MOUTH AT BEDTIME. (Patient not taking: Reported on 11/01/2019), Disp: 90 capsule, Rfl: 2 .  omeprazole (PRILOSEC) 20 MG capsule, Take 20 mg by mouth daily. (Patient not taking: Reported on 11/01/2019), Disp: , Rfl:  .  pantoprazole (PROTONIX) 40 MG tablet, TAKE 1 TABLET BY MOUTH EVERY DAY (Patient not taking: Reported on 11/01/2019), Disp: 90 tablet, Rfl: 1 .  phentermine (ADIPEX-P) 37.5 MG tablet, Take 37.5 mg by mouth daily. (Patient not taking: Reported on 11/01/2019), Disp: , Rfl:  .  predniSONE (DELTASONE) 10 MG tablet, Take 6 tablets  today, on day 2 take 5 tablets, day 3 take 4 tablets, day 4 take 3 tablets, day 5 take  2 tablets and 1 tablet the last day (Patient not taking: Reported  on 11/01/2019), Disp: 21 tablet, Rfl: 0 .  rizatriptan (MAXALT) 10 MG tablet, Take 10 mg by mouth as needed for migraine. May repeat in 2 hours if needed (Patient not taking: Reported on 11/01/2019), Disp: , Rfl:  .  SUMAtriptan (IMITREX) 100 MG tablet, Take 1 tablet earliest onset of migraine.  May repeat in 2 hours if headache persists or recurs.  Maximum 2 tablets in 24 hours (Patient not taking: Reported on 11/01/2019), Disp: 10 tablet, Rfl: 2 .  topiramate (TOPAMAX) 50 MG tablet, Take 50 mg by mouth at bedtime. (Patient not taking: Reported on 11/01/2019), Disp: , Rfl:  .  Vitamin D, Ergocalciferol, (DRISDOL) 1.25 MG (50000 UNIT) CAPS capsule, Take 1 capsule (50,000 Units total) by mouth every 7 (seven) days. x12 weeks. (Patient not taking: Reported on 11/01/2019), Disp: 12 capsule, Rfl: 0  No Known Allergies  I personally reviewed active problem list, medication list, allergies, family history, social history, health maintenance, notes from last encounter, lab results, imaging with the patient/caregiver today.   Review of Systems  10 Systems reviewed and are negative for acute change except as noted in the HPI.   Objective:   Virtual encounter, vitals limited, only able to obtain the following Today's Vitals   11/01/19 1031  Temp: 100 F (37.8 C)  TempSrc: Oral  Weight: 256 lb (116.1 kg)  Height: 5\' 2"  (1.575 m)  PainSc: 5   PainLoc: Chest   Body mass index is 46.82 kg/m. Nursing Note and Vital Signs reviewed.  Physical Exam Nursing note reviewed.  Constitutional:      General: She is not in acute distress.    Appearance: She is ill-appearing. She is not toxic-appearing or diaphoretic.     Comments: Appears very tired  Pulmonary:     Effort: No respiratory distress.  Psychiatric:        Mood and Affect: Mood normal.        Behavior: Behavior normal.     PE limited by telephone encounter  No results found for this or any previous visit (from the past 72  hour(s)).  Assessment and Plan:     ICD-10-CM   1. Migraine without aura and without status migrainosus, not intractable  G43.009    per neurology - pt asking for FMLA again  2. Encounter for completion of form with patient  W10.93    FMLA recert, intermittent FMLA due to migraines  3. COVID-19 virus infection  U07.1    advised to go to ER or UC with chest pain and severe fatigue and SOB - needs exam and VS     Pt has red flags today - endorses CP, severe sx with minimal exertion  No obvious distress, but w/o VS to know pulse ox - I advised pt to go to UC or ER for further assessment.    She other wise understands COVID isolation - onset of illness one week ago, worsening respiratory sx and fever - will need  to remain isolated except to get medical care.  -Red flags and when to present for emergency care or RTC including fever >101.48F, chest pain, shortness of breath, new/worsening/un-resolving symptoms, reviewed with patient at time of visit. Follow up and care instructions discussed and provided in AVS. - I discussed the assessment and treatment plan with the patient. The patient was provided an opportunity to ask questions and all were answered. The patient agreed with the plan and demonstrated an understanding of the instructions.  I provided 30 minutes of non-face-to-face time during this encounter.  Delsa Grana, PA-C 11/01/19 12:04 PM

## 2019-11-01 NOTE — ED Notes (Signed)
Diagnosed with covid on 15th.  Says short of breath and feels like pressure in chest.  Is able to talk in sentences.

## 2019-11-02 ENCOUNTER — Encounter: Payer: Self-pay | Admitting: Family Medicine

## 2019-11-08 ENCOUNTER — Encounter: Payer: Self-pay | Admitting: Family Medicine

## 2019-11-25 ENCOUNTER — Ambulatory Visit: Payer: Self-pay

## 2019-12-05 NOTE — Progress Notes (Signed)
Patient ID: LAKENZIE MCCLAFFERTY, female    DOB: [birth year], 34 y.o.   MRN: [id number]  PCP: Delsa Grana, PA-C  Chief Complaint  Patient presents with  . Pain    Started in May 2021, a lot of pain on righ upper side of stomach, woke up this Satruday with intense pain when she turns, if she presses on it, she is concerned with the pain    Subjective:   Ashlynd MADELYNN MALSON is a 34 y.o. female, presents to clinic with CC of the following:  Chief Complaint  Patient presents with  . Pain    Started in May 2021, a lot of pain on righ upper side of stomach, woke up this Satruday with intense pain when she turns, if she presses on it, she is concerned with the pain    HPI:  Patient is a 34 y.o female patient of Delsa Grana Presents today with right side pain,   She was seen by urgent care on 09/06/19 for the same complaint, although I noted to her today that was on the left side by the chart review and this is on the right side.  She then noted that it was probably on the left side previously.  She notes that she was not happy with that visit as she did not think it was her back causing her symptoms, and when the symptoms resolved after a week or 2, she had no further work-up.  The summary from that visit as follows:  34 year old female presents with left flank pain.  Urinalysis not consistent with UTI.  Sending culture.  Pregnancy test negative.  Abdominal exam benign.  No evidence of acute abdomen.  KUB obtained and independently reviewed by me.  Interpretation: No acute abnormalities.  No evidence of obstruction or nephrolithiasis.  Suspect musculoskeletal in nature.  Could still be related to stone but unlikely in the setting of constant pain and no evidence of hematuria on urine microscopy.  Toradol as needed.  Follow-up if persists or worsens.  She returned to the ER on 5/27 as not improved/worsened. Her history was as follows: Patient states that started last weekend and was seen by her  PCP and then by urgent care.  Patient states she was placed on tramadol for pain.  She states pain began while she was constipated.  Patient states that she took over-the-counter medication and had relief of her constipation.  She was seen at urgent care and urinalysis and physical exam was unremarkable.  Patient was told to be seen in the ED if she has continued problems.  Patient denies any fever, chills, nausea or vomiting.  There is no history of previous kidney stones.  She denies any injury to her back.  She rates her pain as a 10/10. The A/P was as follows: 34 year old female presents to the ED with complaint of left flank pain that started 6 days ago.  Patient states it is nonradiating and she has not had any saddle anesthesias or incontinence of bowel or bladder.  Patient was seen at urgent care and was told to be seen at the ED if any continued problems.  Patient had a recent history of constipation which is now resolved with over-the-counter medication.  Patient denies any injury to her back however she continues to have some left lateral muscular pain which is increased with range of motion.  She is unaware of any known injury.  Physical exam is suspicious for muscle skeletal pain.  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  CT scan was negative for renal stones.  Patient agreed to have Toradol 30 mg IM after coming back from having her CT scan.  While waiting for the results patient was able to go to sleep.  Prior to discharge she states that her back is feeling much better.  Patient was reassured.  She was given a prescription for prednisone 60 mg 6-day taper, methocarbamol 500 mg every 6 hours as needed for muscle spasms and Norco if needed for severe pain.  She is encouraged to use ice or heat to her back as needed for discomfort and follow-up with her PCP if any continued problems.  She returns today with complaints of pain  now on her right side, she states very much like she had back in May.  There was no trauma prior to it starting, started Saturday when she awoke, and has continued to date.  She feels it beneath that rib cage on the right, anteriorly and towards her side, and is very uncomfortable with movements such as turning in the car, or turning in bed, and also painful when she presses on the area.  She describes it as a sharp pain.  Has had trouble sleeping due to this.  Can be bothersome when she takes a real deep breath, although denies any chest pains or pains with breathing.  She denies any relationship to food, denies any change in bowel habits or constipation recently, as she had a normal bowel movement yesterday and the day prior.  (Noted she was constipated when she had the symptoms in May).  Denies any nausea or vomiting, no fevers, no diarrhea, no dark or black stools, no bleeding per rectum.  Her last period was a little shorter than normal, but otherwise normal and she states she is not pregnant.  Denies any dysuria, or pains in her flanks.   She had Covid in July, diagnosed 10/27/2019 and was seen in the emergency department 11/01/2019 with concerns for worsening chest pain and shortness of breath.  The chest x-ray then was unremarkable, with the work-up reassuring, and she notes those symptoms have since resolved.  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  Never a smoker, and minimal alcohol use.  She does take Protonix for reflux daily.  She cannot take Tylenol due to concerns for rebound headaches, and has been using some low-dose ibuprofen to help, also had a Flexeril product at home, 5 mg which she has taken to try to help her sleep at night.   Patient Active Problem  List   Diagnosis Date Noted  . Migraine without aura and without status migrainosus, not intractable 12/01/2018  . Gastroesophageal reflux disease 12/01/2018  . Prediabetes 02/05/2017  . 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 Thyroid nodule 01/22/2017  . Vitamin D deficiency 01/22/2017  . Acanthosis nigricans 08/05/2015  . Anxiety and depression 01/26/2015  . Dysmenorrhea 01/26/2015  . Fibroid 01/26/2015  . Anemia, iron deficiency 09/21/2009  . Obesity, morbid, BMI 50 or higher ([place]) 07/27/2009      Current Outpatient Medications:  .  bismuth subsalicylate (PEPTO BISMOL) 262 MG/15ML suspension, Take 30 mLs by mouth  every 6 (six) hours as needed. , Disp: , Rfl:  .  clobetasol ointment (TEMOVATE) 0.05 %, APPLY TO AFFECTED AREAS ON HANDS TWICE DAILY UNTIL CLEAR, THEN AS NEEDED, Disp: 30 g, Rfl: 1 .  Iron-Folic [id number] 150-1.25 MG TABS, Take 1 tablet by mouth at bedtime., Disp: 90 tablet, Rfl: 1 .  nortriptyline (PAMELOR) 10 MG capsule, TAKE 1 CAPSULE (10 MG TOTAL) BY MOUTH AT BEDTIME., Disp: 90 capsule, Rfl: 2 .  pantoprazole (PROTONIX) 40 MG tablet, TAKE 1 TABLET BY MOUTH EVERY DAY, Disp: 90 tablet, Rfl: 1 .  SUMAtriptan (IMITREX) 100 MG tablet, Take 1 tablet earliest onset of migraine.  May repeat in 2 hours if headache persists or recurs.  Maximum 2 tablets in 24 hours, Disp: 10 tablet, Rfl: 2 .  HYDROcodone-acetaminophen (NORCO/VICODIN) 5-325 MG tablet, Take 1 tablet by mouth every 6 (six) hours as needed for moderate pain. (Patient not taking: Reported on 11/01/2019), Disp: 12 tablet, Rfl: 0 .  methocarbamol (ROBAXIN) 500 MG tablet, Take 1 tablet (500 mg total) by mouth every 6 (six) hours as needed for muscle spasms. (Patient not taking: Reported on 11/01/2019), Disp: 12 tablet, Rfl: 0 .  naproxen (NAPROSYN) 500 MG tablet, Take 500 mg by mouth 2 (two) times daily. (Patient not taking: Reported on 11/01/2019), Disp: , Rfl:  .  omeprazole (PRILOSEC) 20 MG capsule, Take 20 mg by mouth  daily. (Patient not taking: Reported on 11/01/2019), Disp: , Rfl:    No Known Allergies   Past Surgical History:  Procedure Laterality Date  . CESAREAN SECTION    . TONSILLECTOMY       Family History  Problem Relation Age of Onset  . Hypertension Mother   . Depression Mother   . Hyperlipidemia Mother   . Other Father        unknown medical history  . Depression Sister   . Anxiety disorder Sister   . Anxiety disorder Brother   . Eczema Daughter   . COPD Maternal Grandmother   . Emphysema Maternal Grandmother   . Cancer Maternal Grandfather        stomach cancer     Social History   Tobacco Use  . Smoking status: Never Smoker  . Smokeless tobacco: Never Used  Substance Use Topics  . Alcohol use: Yes    Comment: occassional - 3 glasses wine/month    With staff assistance, above reviewed with the patient today.  ROS: As per HPI, otherwise no specific complaints on a limited and focused system review   No results found for this or any previous visit (from the past 72 hour(s)).   PHQ2/9: Depression screen [facility] 2/9 11/01/2019 07/14/2019 03/04/2019 02/15/2019 12/01/2018  Decreased Interest 2 0 0 0 0  Down, Depressed, Hopeless 2 0 0 0 0  PHQ - 2 Score 4 0 0 0 0  Altered sleeping 0 0 0 0 0  Tired, decreased energy 2 0 0 0 0  Change in appetite 2 0 0 0 0  Feeling bad or failure about yourself  0 0 0 0 0  Trouble concentrating 0 0 0 0 0  Moving slowly or fidgety/restless 0 0 0 0 0  Suicidal thoughts 0 0 0 0 0  PHQ-9 Score 8 0 0 0 0  Difficult doing work/chores Somewhat difficult Not difficult at all Not difficult at all Not difficult at all Not difficult at all   PHQ-2/9 Result reviewed, a lot related to her symptoms and some difficulty sleeping in the past  few nights.  Fall Risk: Fall Risk  12/06/2019 11/01/2019 08/05/2019 07/14/2019 03/30/2019  Falls in the past year? 0 0 0 0 0  Number falls in past yr: 0 0 0 0 0  Injury with Fall? 0 0 0 0 0  Follow up - - - - -        Objective:   Vitals:   12/06/19 1408  BP: 134/86  Pulse: (!) 106  Resp: 16  Temp: 98.9 F (37.2 C)  TempSrc: Oral  SpO2: 100%  Weight: 265 lb 3.2 oz (120.3 kg)  Height: 5\' 3"  (1.6 m)    Body mass index is 46.98 kg/m.  Physical Exam   NAD, masked, pleasant HEENT - [place]/AT, sclera anicteric,pharynx clear Neck - supple,  no rigidity Car - RRR without m/g/r, was borderline tachycardic with a heart rate on my exam approximately 96 and regular Pulm- RR and effort normal at rest, CTA without wheeze or rales, O2 sats 100% Abd - soft, obese, markedly tender palpating the right upper quadrant beneath the costal margin and extending towards the axillary line, no marked rebound or guarding present, ND, BS+,  no obvious masses, nontender in the epigastric region, nontender left upper quadrant, and nontender lower quadrants. Back - no CVA tenderness bilateral, nontender over the lumbar spine Skin- no rash noted on exposed areas, with no blistering rash noted on the abdomen or back. Ext - no marked LE edema,  Neuro/psychiatric - affect was not flat, appropriate with conversation  Alert   Grossly non-focal - sensation intact to LT in distal extremities bilateral  Speech normal   Results for orders placed or performed during the hospital encounter of 98/33/82  Basic metabolic panel  Result Value Ref Range   Sodium 136 135 - 145 mmol/L   Potassium 3.6 3.5 - 5.1 mmol/L   Chloride 105 98 - 111 mmol/L   CO2 25 22 - 32 mmol/L   Glucose, Bld 112 (H) 70 - 99 mg/dL   BUN 9 6 - 20 mg/dL   Creatinine, Ser 0.69 0.44 - 1.00 mg/dL   Calcium 8.6 (L) 8.9 - 10.3 mg/dL   GFR calc non Af Amer >60 >60 mL/min   GFR calc Af Amer >60 >60 mL/min   Anion gap 6 5 - 15  CBC  Result Value Ref Range   WBC 8.5 4.0 - 10.5 K/uL   RBC 5.32 (H) 3.87 - 5.11 MIL/uL   Hemoglobin 10.9 (L) 12.0 - 15.0 g/dL   HCT 36.6 36 - 46 %   MCV 68.8 (L) 80.0 - 100.0 fL   MCH 20.5 (L) 26.0 - 34.0 pg   MCHC 29.8 (L) 30.0 - 36.0  g/dL   RDW 20.8 (H) 11.5 - 15.5 %   Platelets 400 150 - 400 K/uL   nRBC 0.0 0.0 - 0.2 %  Pregnancy, urine POC  Result Value Ref Range   Preg Test, Ur NEGATIVE NEGATIVE  Troponin I (High Sensitivity)  Result Value Ref Range   Troponin I (High Sensitivity) <2 <18 ng/L       Assessment & Plan:   1. Right upper quadrant abdominal pain, felt towards the side/axillary line noted As we discussed, the exact cause of her symptoms presently is unclear.  Her discomfort is in that right upper quadrant area, with cholecystitis, hepatitis, less likely pancreatitis among entities in the differential.  There is no relationship to meals noted, she denies constipation concerns, no diarrhea or change in bowel habits.  No rash to  raise a zoster concern, although sometimes the pain can precede a rash.  She has had no fevers or nausea and vomiting. She did fairly recently have Covid, although symptoms have much improved, with no respiratory complaints, and doubt any lung infection concerns.  No pleuritic chest pain component. The pain does go towards the axillary line some, although no flank pain concerns. Discussed getting some labs, with those below ordered. Also felt getting a urine dip as well as a urine pregnancy test in the office for completeness sake would be helpful and those were completed. The urine was positive for protein, which is not an entirely new finding, and also positive for moderate leukocytes with the appearance normal and no foul odor and the nitrite negative and blood negative. Doubt infection concern, although will send for urine culture Also will get a urine ultrasound, with attention to the right upper quadrant to help assess. Discussed that ibuprofen can bother the stomach some, and would just take over-the-counter strength only as needed, taking with food in the short-term, as she cannot take Tylenol products. She has been using 5 mg of Flexeril at bedtime to help and she wanted to  continue.  Did not feel that was unreasonable at this time, although will not add stronger pain medications presently as we need to monitor and follow.  - COMPLETE METABOLIC PANEL WITH GFR - CBC with Differential/Platelet - POCT urinalysis dipstick - POCT urine pregnancy - Hepatitis panel, acute - Lipase - Amylase - US ABDOMEN LIMITED RUQ; Future  2. Obesity, morbid, BMI 50 or higher ([place]) We will await the ultrasound results and may be limited with the obesity issue.  Discussed possibly having a CAT scan done at some point pending her status.  3. History of COVID-19 Has pretty much recovered from the Covid infection, with her diagnosis about a month ago.  The question is could this be anything post Covid, and may add a chest x-ray in the very near future pending her clinical status to ensure no more concerns in that realm.  She did have one about a month ago with no pneumonia concerns noted on that chest x-ray.  Emphasized to her if she has increasing pain symptoms, any nausea or vomiting, fevers, change in bowel habits with any maroon or dark or black stools, any bleeding per rectum, she needs to go to the emergency room for more emergent assessment and she was understanding of that.  4.  Anxiety/depression history Her PHQ-9 today was reviewed with an elevated some compared to previous.  She did note some increased anxiety with her symptoms, and I do feel that is contributing to the PHQ-9 result today.  We will continue to monitor presently  Await the lab results presently and she is aware I will not have those results back till tomorrow and also the ultrasound ordered today.      Towanda Malkin, MD 12/06/19 2:19 PM

## 2019-12-06 ENCOUNTER — Encounter: Payer: Self-pay | Admitting: Internal Medicine

## 2019-12-06 ENCOUNTER — Ambulatory Visit: Payer: BC Managed Care – PPO | Admitting: Internal Medicine

## 2019-12-06 ENCOUNTER — Other Ambulatory Visit: Payer: Self-pay

## 2019-12-06 VITALS — BP 134/86 | HR 106 | Temp 98.9°F | Resp 16 | Ht 63.0 in | Wt 265.2 lb

## 2019-12-06 DIAGNOSIS — R109 Unspecified abdominal pain: Secondary | ICD-10-CM

## 2019-12-06 DIAGNOSIS — Z8616 Personal history of COVID-19: Secondary | ICD-10-CM | POA: Diagnosis not present

## 2019-12-06 DIAGNOSIS — F32A Depression, unspecified: Secondary | ICD-10-CM

## 2019-12-06 DIAGNOSIS — R1011 Right upper quadrant pain: Secondary | ICD-10-CM

## 2019-12-06 DIAGNOSIS — F419 Anxiety disorder, unspecified: Secondary | ICD-10-CM

## 2019-12-06 DIAGNOSIS — R10A0 Flank pain, unspecified side: Secondary | ICD-10-CM

## 2019-12-06 DIAGNOSIS — F329 Major depressive disorder, single episode, unspecified: Secondary | ICD-10-CM

## 2019-12-06 LAB — POCT URINALYSIS DIPSTICK
Appearance: NORMAL
Bilirubin, UA: NEGATIVE
Blood, UA: NEGATIVE
Glucose, UA: NEGATIVE
Ketones, UA: NEGATIVE
Nitrite, UA: NEGATIVE
Odor: NORMAL
Protein, UA: POSITIVE — AB
Spec Grav, UA: 1.015 (ref 1.010–1.025)
Urobilinogen, UA: 0.2 E.U./dL
pH, UA: 7.5 (ref 5.0–8.0)

## 2019-12-06 LAB — POCT URINE PREGNANCY: Preg Test, Ur: NEGATIVE

## 2019-12-06 MED ORDER — CYCLOBENZAPRINE HCL 5 MG PO TABS: 5.0000 mg | ORAL_TABLET | Freq: Every day | ORAL | 1 refills | Status: DC

## 2019-12-06 NOTE — Patient Instructions (Signed)
Await results of lab tests ordered and also an ultrasound that was ordered  Okay to continue taking the 5 mg of Flexeril at bedtime as needed, and using over-the-counter strength ibuprofen, taking with food no more than 3-4 times a day as needed for pain.  Emphasized if you start to have increasing pain, nausea or vomiting, fevers, change in bowel habits with maroon or black stools, do need to proceed to an emergency setting to have a more emergent work-up.

## 2019-12-07 ENCOUNTER — Encounter: Payer: Self-pay | Admitting: Internal Medicine

## 2019-12-07 LAB — URINE CULTURE
MICRO NUMBER:: 10865027
SPECIMEN QUALITY:: ADEQUATE

## 2019-12-07 LAB — COMPLETE METABOLIC PANEL WITH GFR
AG Ratio: 1 (calc) (ref 1.0–2.5)
ALT: 6 U/L (ref 6–29)
AST: 12 U/L (ref 10–30)
Albumin: 3.5 g/dL — ABNORMAL LOW (ref 3.6–5.1)
Alkaline phosphatase (APISO): 68 U/L (ref 31–125)
BUN: 10 mg/dL (ref 7–25)
CO2: 25 mmol/L (ref 20–32)
Calcium: 8.7 mg/dL (ref 8.6–10.2)
Chloride: 105 mmol/L (ref 98–110)
Creat: 0.67 mg/dL (ref 0.50–1.10)
GFR, Est African American: 133 mL/min/{1.73_m2} (ref >=60)
GFR, Est Non African American: 115 mL/min/{1.73_m2} (ref >=60)
Globulin: 3.4 g/dL (calc) (ref 1.9–3.7)
Glucose, Bld: 96 mg/dL (ref 65–99)
Potassium: 3.9 mmol/L (ref 3.5–5.3)
Sodium: 138 mmol/L (ref 135–146)
Total Bilirubin: 0.2 mg/dL (ref 0.2–1.2)
Total Protein: 6.9 g/dL (ref 6.1–8.1)

## 2019-12-07 LAB — CBC WITH DIFFERENTIAL/PLATELET
Absolute Monocytes: 491 cells/uL (ref 200–950)
Basophils Absolute: 23 cells/uL (ref 0–200)
Basophils Relative: 0.3 %
Eosinophils Absolute: 172 cells/uL (ref 15–500)
Eosinophils Relative: 2.2 %
HCT: 33.3 % — ABNORMAL LOW (ref 35.0–45.0)
Hemoglobin: 9.7 g/dL — ABNORMAL LOW (ref 11.7–15.5)
Lymphs Abs: 2933 cells/uL (ref 850–3900)
MCH: 20.6 pg — ABNORMAL LOW (ref 27.0–33.0)
MCHC: 29.1 g/dL — ABNORMAL LOW (ref 32.0–36.0)
MCV: 70.6 fL — ABNORMAL LOW (ref 80.0–100.0)
MPV: 9.6 fL (ref 7.5–12.5)
Monocytes Relative: 6.3 %
Neutro Abs: 4181 cells/uL (ref 1500–7800)
Neutrophils Relative %: 53.6 %
Platelets: 444 10*3/uL — ABNORMAL HIGH (ref 140–400)
RBC: 4.72 10*6/uL (ref 3.80–5.10)
RDW: 17.3 % — ABNORMAL HIGH (ref 11.0–15.0)
Total Lymphocyte: 37.6 %
WBC: 7.8 10*3/uL (ref 3.8–10.8)

## 2019-12-07 LAB — HEPATITIS PANEL, ACUTE
Hep A IgM: NONREACTIVE
Hep B C IgM: NONREACTIVE
Hepatitis B Surface Ag: NONREACTIVE
Hepatitis C Ab: NONREACTIVE
SIGNAL TO CUT-OFF: 0.06 (ref <1.00)

## 2019-12-07 LAB — AMYLASE: Amylase: 28 U/L (ref 21–101)

## 2019-12-07 LAB — LIPASE: Lipase: 30 U/L (ref 7–60)

## 2019-12-07 NOTE — Addendum Note (Signed)
Addended by: Lebron Conners D on: 12/07/2019 12:48 PM   Modules accepted: Orders

## 2019-12-08 ENCOUNTER — Encounter: Payer: Self-pay | Admitting: Internal Medicine

## 2019-12-10 ENCOUNTER — Emergency Department
Admission: EM | Admit: 2019-12-10 | Discharge: 2019-12-10 | Disposition: A | Discharge status: Home / Self Care | Payer: BC Managed Care – PPO | Attending: Emergency Medicine | Admitting: Emergency Medicine

## 2019-12-10 ENCOUNTER — Other Ambulatory Visit: Payer: Self-pay

## 2019-12-10 ENCOUNTER — Emergency Department: Payer: BC Managed Care – PPO

## 2019-12-10 DIAGNOSIS — Z79899 Other long term (current) drug therapy: Secondary | ICD-10-CM | POA: Diagnosis not present

## 2019-12-10 DIAGNOSIS — K8051 Calculus of bile duct without cholangitis or cholecystitis with obstruction: Secondary | ICD-10-CM | POA: Diagnosis not present

## 2019-12-10 DIAGNOSIS — K8050 Calculus of bile duct without cholangitis or cholecystitis without obstruction: Secondary | ICD-10-CM

## 2019-12-10 DIAGNOSIS — R1011 Right upper quadrant pain: Secondary | ICD-10-CM | POA: Diagnosis present

## 2019-12-10 LAB — CBC
HCT: 33.4 % — ABNORMAL LOW (ref 36.0–46.0)
Hemoglobin: 9.7 g/dL — ABNORMAL LOW (ref 12.0–15.0)
MCH: 20.4 pg — ABNORMAL LOW (ref 26.0–34.0)
MCHC: 29 g/dL — ABNORMAL LOW (ref 30.0–36.0)
MCV: 70.2 fL — ABNORMAL LOW (ref 80.0–100.0)
Platelets: 447 10*3/uL — ABNORMAL HIGH (ref 150–400)
RBC: 4.76 MIL/uL (ref 3.87–5.11)
RDW: 19 % — ABNORMAL HIGH (ref 11.5–15.5)
WBC: 7.4 10*3/uL (ref 4.0–10.5)
nRBC: 0 % (ref 0.0–0.2)

## 2019-12-10 LAB — COMPREHENSIVE METABOLIC PANEL
ALT: 9 U/L (ref 0–44)
AST: 14 U/L — ABNORMAL LOW (ref 15–41)
Albumin: 3.4 g/dL — ABNORMAL LOW (ref 3.5–5.0)
Alkaline Phosphatase: 68 U/L (ref 38–126)
Anion gap: 8 (ref 5–15)
BUN: 11 mg/dL (ref 6–20)
CO2: 26 mmol/L (ref 22–32)
Calcium: 8.5 mg/dL — ABNORMAL LOW (ref 8.9–10.3)
Chloride: 104 mmol/L (ref 98–111)
Creatinine, Ser: 0.81 mg/dL (ref 0.44–1.00)
GFR calc Af Amer: >60 mL/min (ref >60)
GFR calc non Af Amer: >60 mL/min (ref >60)
Glucose, Bld: 101 mg/dL — ABNORMAL HIGH (ref 70–99)
Potassium: 3.9 mmol/L (ref 3.5–5.1)
Sodium: 138 mmol/L (ref 135–145)
Total Bilirubin: 0.6 mg/dL (ref 0.3–1.2)
Total Protein: 7.6 g/dL (ref 6.5–8.1)

## 2019-12-10 LAB — URINALYSIS, COMPLETE (UACMP) WITH MICROSCOPIC
Bacteria, UA: NONE SEEN
Bilirubin Urine: NEGATIVE
Glucose, UA: NEGATIVE mg/dL
Hgb urine dipstick: NEGATIVE
Ketones, ur: NEGATIVE mg/dL
Leukocytes,Ua: NEGATIVE
Nitrite: NEGATIVE
Protein, ur: NEGATIVE mg/dL
Specific Gravity, Urine: 1.016 (ref 1.005–1.030)
pH: 7 (ref 5.0–8.0)

## 2019-12-10 LAB — LIPASE, BLOOD: Lipase: 26 U/L (ref 11–51)

## 2019-12-10 LAB — POC URINE PREG, ED: Preg Test, Ur: NEGATIVE

## 2019-12-10 MED ORDER — MELOXICAM 15 MG PO TABS
15.0000 mg | ORAL_TABLET | Freq: Every day | ORAL | 0 refills | Status: DC
Start: 2019-12-10 — End: 2020-06-05

## 2019-12-10 MED ORDER — IOHEXOL 300 MG/ML  SOLN
150.0000 mL | Freq: Once | INTRAMUSCULAR | Status: AC | PRN
  Administered 2019-12-10: 125 mL via INTRAVENOUS
  Filled 2019-12-10: qty 150

## 2019-12-10 MED ORDER — OXYCODONE-ACETAMINOPHEN 5-325 MG PO TABS
1.0000 | ORAL_TABLET | Freq: Once | ORAL | Status: AC
  Administered 2019-12-10: 1 via ORAL
  Filled 2019-12-10: qty 1

## 2019-12-10 MED ORDER — HYDROCODONE-ACETAMINOPHEN 5-325 MG PO TABS: 1.0000 | ORAL_TABLET | ORAL | 0 refills | Status: DC | PRN

## 2019-12-10 MED ORDER — ONDANSETRON 4 MG PO TBDP
4.0000 mg | ORAL_TABLET | Freq: Three times a day (TID) | ORAL | 0 refills | Status: DC | PRN
Start: 2019-12-10 — End: 2020-10-04

## 2019-12-10 MED ORDER — SODIUM CHLORIDE 0.9 % IV BOLUS
1000.0000 mL | Freq: Once | INTRAVENOUS | Status: AC
  Administered 2019-12-10: 1000 mL via INTRAVENOUS

## 2019-12-10 MED ORDER — DICYCLOMINE HCL 10 MG PO CAPS: 10.0000 mg | ORAL_CAPSULE | Freq: Four times a day (QID) | ORAL | 0 refills | Status: DC

## 2019-12-10 MED ORDER — IOHEXOL 300 MG/ML  SOLN
100.0000 mL | Freq: Once | INTRAMUSCULAR | Status: DC | PRN
  Filled 2019-12-10: qty 100

## 2019-12-10 NOTE — ED Provider Notes (Signed)
[facility] Emergency Department Provider Note  ____________________________________________  Time seen: Approximately 2:41 PM  I have reviewed the triage vital signs and the nursing notes.   HISTORY  Chief Complaint Abdominal Pain    HPI Sherri Robertson is a 34 y.o. female patient presents emergency department with 1 week worsening of right upper quadrant abdominal pain.  Patient states that the pain awoke her approximately a week ago.  Patient states that she is having sharp pain in the right upper quadrant.  No emesis.  No diarrhea or constipation.  She has had no fevers or chills.  Pain has been localized in the same spot.  She saw her primary care who ran labs without any acute abnormality.  They advised that if the symptoms got worse she should present to the emergency department.  She states that pain has been steadily worsening.  Medical history as described below with no complaints with chronic medical complaints.  No history of gallbladder issues.         Past Medical History:  Diagnosis Date   Abnormal cervical Pap smear with positive HPV DNA test 01/26/2015   Anemia    GERD (gastroesophageal reflux disease)    Low back pain 01/26/2015   Migraine     Patient Active Problem List   Diagnosis Date Noted   Migraine without aura and without status migrainosus, not intractable 12/01/2018   Gastroesophageal reflux disease 12/01/2018   Prediabetes 02/05/2017   Lipoma of back 02/05/2017   Thyroid nodule 01/22/2017   Vitamin D deficiency 01/22/2017   Acanthosis nigricans 08/05/2015   Anxiety and depression 01/26/2015   Dysmenorrhea 01/26/2015   Fibroid 01/26/2015   Anemia, iron deficiency 09/21/2009   Obesity, morbid, BMI 50 or higher ([place]) 07/27/2009    Past Surgical History:  Procedure Laterality Date   CESAREAN SECTION     TONSILLECTOMY      Prior to Admission medications   Medication Sig Start Date End Date Taking?  Authorizing Provider  bismuth subsalicylate (PEPTO BISMOL) 262 MG/15ML suspension Take 30 mLs by mouth every 6 (six) hours as needed.     [provider]  clobetasol ointment (TEMOVATE) 0.05 % APPLY TO AFFECTED AREAS ON HANDS TWICE DAILY UNTIL CLEAR, THEN AS NEEDED 07/14/19   Delsa Grana, PA-C  cyclobenzaprine (FLEXERIL) 5 MG tablet Take 1 tablet (5 mg total) by mouth at bedtime. Take as needed 12/06/19   Towanda Malkin, MD  dicyclomine (BENTYL) 10 MG capsule Take 1 capsule (10 mg total) by mouth 4 (four) times daily for 14 days. 12/10/19 12/24/19  Taelor Moncada, Charline Bills, PA-C  HYDROcodone-acetaminophen (NORCO/VICODIN) 5-325 MG tablet Take 1 tablet by mouth every 4 (four) hours as needed for moderate pain. 12/10/19   Draper Gallon, Charline Bills, PA-C  Iron-Folic [id number] 150-1.25 MG TABS Take 1 tablet by mouth at bedtime. 03/04/19   Delsa Grana, PA-C  meloxicam (MOBIC) 15 MG tablet Take 1 tablet (15 mg total) by mouth daily. 12/10/19   Mandisa Persinger, Charline Bills, PA-C  naproxen (NAPROSYN) 500 MG tablet Take 500 mg by mouth 2 (two) times daily. Patient not taking: Reported on 11/01/2019 09/08/19   [provider]  nortriptyline (PAMELOR) 10 MG capsule TAKE 1 CAPSULE (10 MG TOTAL) BY MOUTH AT BEDTIME. 04/12/19   Tomi Likens, Adam R, DO  ondansetron (ZOFRAN-ODT) 4 MG disintegrating tablet Take 1 tablet (4 mg total) by mouth every 8 (eight) hours as needed for nausea or vomiting. 12/10/19   Dagen Beevers, Charline Bills, PA-C  pantoprazole (  PROTONIX) 40 MG tablet TAKE 1 TABLET BY MOUTH EVERY DAY 05/31/19   Delsa Grana, PA-C  SUMAtriptan (IMITREX) 100 MG tablet Take 1 tablet earliest onset of migraine.  May repeat in 2 hours if headache persists or recurs.  Maximum 2 tablets in 24 hours 08/05/19   Pieter Partridge, DO    Allergies Patient has no known allergies.  Family History  Problem Relation Age of Onset   Hypertension Mother    Depression Mother    Hyperlipidemia Mother    Other Father         unknown medical history   Depression Sister    Anxiety disorder Sister    Anxiety disorder Brother    Eczema Daughter    COPD Maternal Grandmother    Emphysema Maternal Grandmother    Cancer Maternal Grandfather        stomach cancer    Social History Social History   Tobacco Use   Smoking status: Never Smoker   Smokeless tobacco: Never Used  Scientific laboratory technician Use: Never used  Substance Use Topics   Alcohol use: Yes    Comment: occassional - 3 glasses wine/month   Drug use: No     Review of Systems  Constitutional: No fever/chills Eyes: No visual changes. No discharge ENT: No upper respiratory complaints. Cardiovascular: no chest pain. Respiratory: no cough. No SOB. Gastrointestinal: Positive for right upper quadrant abdominal pain.  No nausea, no vomiting.  No diarrhea.  No constipation. Genitourinary: Negative for dysuria. No hematuria Musculoskeletal: Negative for musculoskeletal pain. Skin: Negative for rash, abrasions, lacerations, ecchymosis. Neurological: Negative for headaches, focal weakness or numbness. 10-point ROS otherwise negative.  ____________________________________________   PHYSICAL EXAM:  VITAL SIGNS: ED Triage Vitals  Enc Vitals Group     BP 12/10/19 1018 109/62     Pulse Rate 12/10/19 1018 90     Resp 12/10/19 1018 20     Temp 12/10/19 1024 98.6 F (37 C)     Temp Source 12/10/19 1024 Oral     SpO2 12/10/19 1018 100 %     Weight 12/10/19 1020 265 lb (120.2 kg)     Height 12/10/19 1020 5\' 2"  (1.575 m)     Head Circumference --      Peak Flow --      Pain Score 12/10/19 1019 10     Pain Loc --      Pain Edu? --      Excl. in [place]? --      Constitutional: Alert and oriented. Well appearing and in no acute distress. Eyes: Conjunctivae are normal. PERRL. EOMI. Head: Atraumatic. ENT:      Ears:       Nose: No congestion/rhinnorhea.      Mouth/Throat: Mucous membranes are moist.  Neck: No stridor.     Cardiovascular: Normal rate, regular rhythm. Normal S1 and S2.  Good peripheral circulation. Respiratory: Normal respiratory effort without tachypnea or retractions. Lungs CTAB. Good air entry to the bases with no decreased or absent breath sounds. Gastrointestinal: Bowel sounds 4 quadrants.  Soft to palpation.  Patient is exquisitely tender to palpation in right upper quadrant.  Positive for guarding in the right upper quadrant.  No other tenderness or guarding.  No rigidity.  No palpable masses. No distention. No CVA tenderness. Musculoskeletal: Full range of motion to all extremities. No gross deformities appreciated. Neurologic:  Normal speech and language. No gross focal neurologic deficits are appreciated.  Skin:  Skin is warm, dry  and intact. No rash noted. Psychiatric: Mood and affect are normal. Speech and behavior are normal. Patient exhibits appropriate insight and judgement.   ____________________________________________   LABS (all labs ordered are listed, but only abnormal results are displayed)  Labs Reviewed  COMPREHENSIVE METABOLIC PANEL - Abnormal; Notable for the following components:      Result Value   Glucose, Bld 101 (*)    Calcium 8.5 (*)    Albumin 3.4 (*)    AST 14 (*)    All other components within normal limits  CBC - Abnormal; Notable for the following components:   Hemoglobin 9.7 (*)    HCT 33.4 (*)    MCV 70.2 (*)    MCH 20.4 (*)    MCHC 29.0 (*)    RDW 19.0 (*)    Platelets 447 (*)    All other components within normal limits  URINALYSIS, COMPLETE (UACMP) WITH MICROSCOPIC - Abnormal; Notable for the following components:   Color, Urine YELLOW (*)    APPearance CLEAR (*)    All other components within normal limits  POC URINE PREG, ED - Normal  LIPASE, BLOOD   ____________________________________________  EKG   ____________________________________________  RADIOLOGY I personally viewed and evaluated these images as part of my medical  decision making, as well as reviewing the written report by the radiologist.  CT ABDOMEN PELVIS W CONTRAST  Result Date: 12/10/2019 CLINICAL DATA:  Pt states she has been having right sided abdominal pain- she was seen on the 24th by her PCP and states they checked her liver and gallbladder and her bloodwork looked fine- pt states pain has been continually getting worse EXAM: CT ABDOMEN AND PELVIS WITH CONTRAST TECHNIQUE: Multidetector CT imaging of the abdomen and pelvis was performed using the standard protocol following bolus administration of intravenous contrast. CONTRAST:  169mL OMNIPAQUE IOHEXOL 300 MG/ML  SOLN COMPARISON:  CT abdomen pelvis 09/08/2019 FINDINGS: Lower chest: No acute abnormality. Hepatobiliary: No focal liver abnormality is seen. No gallstones, gallbladder wall thickening, or biliary dilatation. Pancreas: Unremarkable. No pancreatic ductal dilatation or surrounding inflammatory changes. Spleen: Normal in size without focal abnormality. Adrenals/Urinary Tract: Adrenal glands are unremarkable. Kidneys are normal, without renal calculi, focal lesion, or hydronephrosis. Bladder is unremarkable. Stomach/Bowel: Stomach is within normal limits. Appendix appears normal. No evidence of bowel wall thickening, distention, or inflammatory changes. Vascular/Lymphatic: No significant vascular findings are present. No enlarged abdominal or pelvic lymph nodes. Reproductive: Uterus and bilateral adnexa are unremarkable. Trace free fluid in the pelvis is likely physiologic. Other: No abdominal wall hernia or abnormality. No abdominopelvic ascites. Musculoskeletal: No acute or significant osseous findings. IMPRESSION: No acute findings in the abdomen or pelvis. Electronically Signed   By: Audie Pinto M.D.   On: 12/10/2019 15:47   US Abdomen Limited RUQ  Result Date: 12/10/2019 CLINICAL DATA:  Right upper quadrant pain for 1 week. EXAM: ULTRASOUND ABDOMEN LIMITED RIGHT UPPER QUADRANT COMPARISON:   CT abdomen pelvis 12/10/2019 FINDINGS: Gallbladder: No gallstones or wall thickening visualized. No sonographic Murphy sign noted by sonographer. Common bile duct: Diameter: 0.2 cm, within normal limits. Liver: No focal lesion identified. Within normal limits in parenchymal echogenicity. Portal vein is patent on color Doppler imaging with normal direction of blood flow towards the liver. Other: None. IMPRESSION: No sonographic finding to explain the patient's right upper quadrant pain. Electronically Signed   By: Audie Pinto M.D.   On: 12/10/2019 17:30    ____________________________________________    PROCEDURES  Procedure(s) performed:  Procedures    Medications  sodium chloride 0.9 % bolus 1,000 mL (0 mLs Intravenous Stopped 12/10/19 1932)  iohexol (OMNIPAQUE) 300 MG/ML solution 150 mL (125 mLs Intravenous Contrast Given 12/10/19 1524)  oxyCODONE-acetaminophen (PERCOCET/ROXICET) 5-325 MG per tablet 1 tablet (1 tablet Oral Given 12/10/19 1814)     ____________________________________________   INITIAL IMPRESSION / ASSESSMENT AND PLAN / ED COURSE  Pertinent labs & imaging results that were available during my care of the patient were reviewed by me and considered in my medical decision making (see chart for details).  Review of the [place] CSRS was performed in accordance of the [place] prior to dispensing any controlled drugs.           Patient's diagnosis is consistent with biliary colic.  Patient presented to emergency department with 1 week history of worsening right upper quadrant abdominal pain.  Labs are reassuring at this time.  Initial differential included cholecystitis, cholelithiasis, choledocholithiasis, biliary colic, colitis, gastritis, pancreatitis, pyelonephritis.  Initial scan with CT revealed no evidence of cholelithiasis, cholecystitis or choledocholithiasis.  Given the persistent pain I did follow this up with ultrasound of the right upper quadrant.  Again no  evidence of cholecystitis or cholelithiasis.  Nonobstructive pattern on labs.  Patient will be tried with symptomatic control medications.  I discussed follow-up with general surgery if symptoms do not resolve for elective cholecystectomy.  I have also discussed referred symptoms and if patient develops the symptoms she should follow-up with her primary care.  Return precautions are discussed with the patient.. Patient will be discharged home with prescriptions for meloxicam, Zofran, very limited Vicodin, Bentyl for symptom relief. Patient is to follow up with primary care or general surgery as needed or otherwise directed. Patient is given ED precautions to return to the ED for any worsening or new symptoms.     ____________________________________________  FINAL CLINICAL IMPRESSION(S) / ED DIAGNOSES  Final diagnoses:  RUQ pain  Biliary colic      NEW MEDICATIONS STARTED DURING THIS VISIT:  ED Discharge Orders         Ordered    meloxicam (MOBIC) 15 MG tablet  Daily        12/10/19 1850    ondansetron (ZOFRAN-ODT) 4 MG disintegrating tablet  Every 8 hours PRN        12/10/19 1850    HYDROcodone-acetaminophen (NORCO/VICODIN) 5-325 MG tablet  Every 4 hours PRN        12/10/19 1850    dicyclomine (BENTYL) 10 MG capsule  4 times daily        12/10/19 1850              This chart was dictated using voice recognition software/Dragon. Despite best efforts to proofread, errors can occur which can change the meaning. Any change was purely unintentional.    Darletta Moll, PA-C 12/10/19 2057    Vladimir Crofts, MD 12/11/19 [phone]

## 2019-12-10 NOTE — ED Triage Notes (Signed)
Pt states she has been having right sided abdominal pain- she was seen on the 24th by her PCP and states they checked her liver and gallbladder and her bloodwork looked fine- pt states pain has been continually getting worse

## 2019-12-12 ENCOUNTER — Ambulatory Visit: Payer: BC Managed Care – PPO

## 2019-12-20 ENCOUNTER — Ambulatory Visit
Admission: RE | Admit: 2019-12-20 | Discharge: 2019-12-20 | Disposition: A | Discharge status: Home / Self Care | Payer: BC Managed Care – PPO | Source: Ambulatory Visit | Attending: Family Medicine | Admitting: Family Medicine

## 2019-12-20 ENCOUNTER — Other Ambulatory Visit: Payer: Self-pay

## 2019-12-20 ENCOUNTER — Encounter: Payer: BC Managed Care – PPO | Admitting: Obstetrics and Gynecology

## 2019-12-20 VITALS — BP 127/93 | HR 83 | Temp 98.2°F | Resp 18 | Ht 62.0 in | Wt 265.0 lb

## 2019-12-20 DIAGNOSIS — J029 Acute pharyngitis, unspecified: Secondary | ICD-10-CM | POA: Diagnosis not present

## 2019-12-20 LAB — GROUP A STREP BY PCR: Group A Strep by PCR: NOT DETECTED

## 2019-12-20 MED ORDER — KETOROLAC TROMETHAMINE 10 MG PO TABS: 10.0000 mg | ORAL_TABLET | Freq: Four times a day (QID) | ORAL | 0 refills | Status: DC | PRN

## 2019-12-20 NOTE — ED Provider Notes (Signed)
[facility]    CSN: [id number] Arrival date & time: 12/20/19  1600      History   Chief Complaint Chief Complaint  Patient presents with  . Appointment  . Sore Throat   HPI  34 year old female presents with the above complaint.  Patient reports that she developed sore throat on Sunday.  Her daughter and her husband also have sore throat.  No fever.  She has no other respiratory symptoms.  Rates her pain is 8/10 in severity.  Worse when she swallows.  No relieving factors.  No other associated symptoms.  No other complaints.  Past Medical History:  Diagnosis Date  . Abnormal cervical Pap smear with positive HPV DNA test 01/26/2015  . Anemia   . GERD (gastroesophageal reflux disease)   . Low back pain 01/26/2015  . Migraine     Patient Active Problem List   Diagnosis Date Noted  . Migraine without aura and without status migrainosus, not intractable 12/01/2018  . Gastroesophageal reflux disease 12/01/2018  . Prediabetes 02/05/2017  . 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 Thyroid nodule 01/22/2017  . Vitamin D deficiency 01/22/2017  . Acanthosis nigricans 08/05/2015  . Anxiety and depression 01/26/2015  . Dysmenorrhea 01/26/2015  . Fibroid 01/26/2015  . Anemia, iron deficiency 09/21/2009  . Obesity, morbid, BMI 50 or higher ([place]) 07/27/2009    Past Surgical History:  Procedure Laterality Date  . CESAREAN SECTION    . TONSILLECTOMY      OB History    Gravida  1   Para  1   Term  1   Preterm      AB      Living        SAB      TAB      Ectopic      Multiple      Live Births               Home Medications    Prior to Admission medications   Medication Sig Start Date End Date Taking? Authorizing Provider  bismuth subsalicylate (PEPTO BISMOL) 262 MG/15ML suspension Take 30 mLs by mouth every 6 (six) hours as needed.    Yes [provider]  clobetasol ointment (TEMOVATE) 0.05 % APPLY TO AFFECTED AREAS ON HANDS TWICE DAILY UNTIL  CLEAR, THEN AS NEEDED 07/14/19  Yes Delsa Grana, PA-C  cyclobenzaprine (FLEXERIL) 5 MG tablet Take 1 tablet (5 mg total) by mouth at bedtime. Take as needed 12/06/19  Yes Towanda Malkin, MD  dicyclomine (BENTYL) 10 MG capsule Take 1 capsule (10 mg total) by mouth 4 (four) times daily for 14 days. 12/10/19 12/24/19 Yes Cuthriell, Charline Bills, PA-C  Iron-Folic [id number] 150-1.25 MG TABS Take 1 tablet by mouth at bedtime. 03/04/19  Yes Delsa Grana, PA-C  meloxicam (MOBIC) 15 MG tablet Take 1 tablet (15 mg total) by mouth daily. 12/10/19  Yes Cuthriell, Charline Bills, PA-C  nortriptyline (PAMELOR) 10 MG capsule TAKE 1 CAPSULE (10 MG TOTAL) BY MOUTH AT BEDTIME. 04/12/19  Yes Jaffe, Adam R, DO  ondansetron (ZOFRAN-ODT) 4 MG disintegrating tablet Take 1 tablet (4 mg total) by mouth every 8 (eight) hours as needed for nausea or vomiting. 12/10/19  Yes Cuthriell, Charline Bills, PA-C  pantoprazole (PROTONIX) 40 MG tablet TAKE 1 TABLET BY MOUTH EVERY DAY 05/31/19  Yes Delsa Grana, PA-C  SUMAtriptan (IMITREX) 100 MG tablet Take 1 tablet earliest onset of migraine.  May repeat in 2 hours if headache persists or recurs.  Maximum 2 tablets in 24 hours 08/05/19  Yes Jaffe, Adam R, DO  ketorolac (TORADOL) 10 MG tablet Take 1 tablet (10 mg total) by mouth every 6 (six) hours as needed for moderate pain or severe pain. 12/20/19   Coral Spikes, DO    Family History Family History  Problem Relation Age of Onset  . Hypertension Mother   . Depression Mother   . Hyperlipidemia Mother   . Other Father        unknown medical history  . Depression Sister   . Anxiety disorder Sister   . Anxiety disorder Brother   . Eczema Daughter   . COPD Maternal Grandmother   . Emphysema Maternal Grandmother   . Cancer Maternal Grandfather        stomach cancer    Social History Social History   Tobacco Use  . Smoking status: Never Smoker  . Smokeless tobacco: Never Used  Vaping Use  . Vaping Use: Never used    Substance Use Topics  . Alcohol use: Yes    Comment: occassional - 3 glasses wine/month  . Drug use: No     Allergies   Patient has no known allergies.   Review of Systems Review of Systems  Constitutional: Negative for fever.  HENT: Positive for sore throat.    Physical Exam Triage Vital Signs ED Triage Vitals  Enc Vitals Group     BP 12/20/19 1620 (!) 127/93     Pulse Rate 12/20/19 1620 83     Resp 12/20/19 1620 18     Temp 12/20/19 1620 98.2 F (36.8 C)     Temp Source 12/20/19 1620 Oral     SpO2 12/20/19 1620 100 %     Weight 12/20/19 1617 265 lb (120.2 kg)     Height 12/20/19 1617 5\' 2"  (1.575 m)     Head Circumference --      Peak Flow --      Pain Score 12/20/19 1616 8     Pain Loc --      Pain Edu? --      Excl. in [place]? --    Updated Vital Signs BP (!) 127/93 (BP Location: Left Arm)   Pulse 83   Temp 98.2 F (36.8 C) (Oral)   Resp 18   Ht 5\' 2"  (1.575 m)   Wt 120.2 kg   LMP 12/13/2019   SpO2 100%   BMI 48.47 kg/m   Visual Acuity Right Eye Distance:   Left Eye Distance:   Bilateral Distance:    Right Eye Near:   Left Eye Near:    Bilateral Near:     Physical Exam Vitals and nursing note reviewed.  Constitutional:      General: She is not in acute distress.    Appearance: Normal appearance. She is obese. She is not ill-appearing.  HENT:     Head: Normocephalic and atraumatic.     Mouth/Throat:     Pharynx: Oropharynx is clear. No oropharyngeal exudate.  Eyes:     General:        Right eye: No discharge.     Conjunctiva/sclera: Conjunctivae normal.  Cardiovascular:     Rate and Rhythm: Normal rate and regular rhythm.  Pulmonary:     Effort: Pulmonary effort is normal.     Breath sounds: Normal breath sounds. No wheezing, rhonchi or rales.  Neurological:     Mental Status: She is alert.  Psychiatric:        Mood  and Affect: Mood normal.        Behavior: Behavior normal.    UC Treatments / Results  Labs (all labs ordered are  listed, but only abnormal results are displayed) Labs Reviewed  GROUP A STREP BY PCR    EKG   Radiology No results found.  Procedures Procedures (including critical care time)  Medications Ordered in UC Medications - No data to display  Initial Impression / Assessment and Plan / UC Course  I have reviewed the triage vital signs and the nursing notes.  Pertinent labs & imaging results that were available during my care of the patient were reviewed by me and considered in my medical decision making (see chart for details).    34 year old female presents with viral pharyngitis.  Strep PCR negative.  Toradol as needed.  Supportive care.  Final Clinical Impressions(s) / UC Diagnoses   Final diagnoses:  Viral pharyngitis     Discharge Instructions     Fluids.  Hot tea/Honey.  Medication as prescribed.  I will call with results.  Take care  Dr. Lacinda Axon    ED Prescriptions    Medication Sig Dispense Auth. Provider   ketorolac (TORADOL) 10 MG tablet Take 1 tablet (10 mg total) by mouth every 6 (six) hours as needed for moderate pain or severe pain. 20 tablet Coral Spikes, DO     PDMP not reviewed this encounter.   Coral Spikes, [place] 12/20/19 2012

## 2019-12-20 NOTE — ED Triage Notes (Signed)
Patient states that she started having a sore throat that started on Sunday. States that she was positive for Covid in July and would like to decline covid testing today. Reports that she is concerned for strep and states that her daughter and husband both have sore throats as well.

## 2019-12-20 NOTE — Discharge Instructions (Signed)
Fluids.  Hot tea/Honey.  Medication as prescribed.  I will call with results.  Take care  Dr. Lacinda Axon

## 2020-02-08 NOTE — Progress Notes (Signed)
Virtual Visit via Video Note The purpose of this virtual visit is to provide medical care while limiting exposure to the novel coronavirus.    Consent was obtained for video visit:  Yes.   Answered questions that patient had about telehealth interaction:  Yes.   I discussed the limitations, risks, security and privacy concerns of performing an evaluation and management service by telemedicine. I also discussed with the patient that there may be a patient responsible charge related to this service. The patient expressed understanding and agreed to proceed.  Pt location: Home Physician Location: office Name of referring provider:  Delsa Grana, PA-C I connected with Sherri Robertson at patients initiation/request on 02/10/2020 at  8:30 AM EDT by video enabled telemedicine application and verified that I am speaking with the correct person using two identifiers. Pt MRN:  [id number] Pt DOB:  June 02, 1985 Video Participants:  Sherri Robertson   History of Present Illness:  Sherri Robertson is a 34year old female who follows up for migraines.  UPDATE: With headache this morning, so visit switched to virtual.  She reports a headache since Tuesday.  It was severe for a day and has since been lingering.  She has treated with sumatriptan tablet.  She had COVID and pneumonia during the summer.  She had stopped many medications around that time.  They were still occurring 1 to 2 times a month, lasting 2 days.   Last migraine was two weeks ago.  I had her try Nurtec for rescue which helped.    Current NSAIDS:none Current analgesics:none Current triptans:sumatriptan 100mg  Current ergotamine:none Current anti-emetic:none Current muscle relaxants:none Current anti-anxiolytic:none Current sleep aide:none Current Antihypertensive medications:none Current Antidepressant medications:nortriptyline 10mg  at bedtime Current Anticonvulsant medications:none Current  anti-CGRP:none Current Vitamins/Herbal/Supplements:D Current Antihistamines/Decongestants:none Other therapy:none Hormone/birth control:none  Caffeine:No coffee or soda Diet:Limited dairy intake.  Hydrates. Exercise:Not routine Depression:no; Anxiety:stable Other pain:no Sleep hygiene:Good. 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 She would need to sleep it off in dark room with something over her eyes. They would occur every 3 to 4 months. However, they have getting more frequent in 2019. She went to the ED twice last year.  Location:Behind the eyes radiating to back of head (may be either side, mostly left eye) Quality:Pressure, pounding Initial intensity:8/10. Shedenies new headache, thunderclap headache Aura:no Premonitory Phase:no Postdrome:fatigue Associated symptoms:Photophobia, phonophobia, occasional nausea.Shedenies associated visual disturbance,unilateral numbness or weakness. Initial duration:1 to 1 1/2 days. Normally occur when she gets up from sleep. Rarely occurs at work. Initial frequency:Twice a month. Initial frequency of abortive medication: Triggers:Dairy (cheese, milk), sleep deprivation Relieving factors:Something cool over her face Activity:aggravates  She has been to the ED a handful of times over the years due to headache. CT head from 07/22/13 and 02/20/16 were personally reviewed and were normal.   Past NSAIDS:naproxen, ibuprofen Past analgesics:Fioricet, acetaminophen Past abortive triptans:rizatriptan 10mg  Past abortive ergotamine:none Past muscle relaxants:cyclobenzaprine Past anti-emetic:Reglan Past antihypertensive medications:none Past antidepressant medications:none Past anticonvulsant medications:topiramate 50mg  (anxiety, irritability) Past anti-CGRP:none Past  vitamins/Herbal/Supplements:none Past antihistamines/decongestants:none Other past therapies:none   Family history of headache:no   Past Medical History: Past Medical History:  Diagnosis Date  . Abnormal cervical Pap smear with positive HPV DNA test 01/26/2015  . Anemia   . GERD (gastroesophageal reflux disease)   . Low back pain 01/26/2015  . Migraine     Medications: Outpatient Encounter Medications as of 02/10/2020  Medication Sig  .  bismuth subsalicylate (PEPTO BISMOL) 262 MG/15ML suspension Take 30 mLs by mouth every 6 (six) hours as needed.   . clobetasol ointment (TEMOVATE) 0.05 % APPLY TO AFFECTED AREAS ON HANDS TWICE DAILY UNTIL CLEAR, THEN AS NEEDED  . cyclobenzaprine (FLEXERIL) 5 MG tablet Take 1 tablet (5 mg total) by mouth at bedtime. Take as needed  . dicyclomine (BENTYL) 10 MG capsule Take 1 capsule (10 mg total) by mouth 4 (four) times daily for 14 days.  . Iron-Folic [id number] 150-1.25 MG TABS Take 1 tablet by mouth at bedtime.  Marland Kitchen ketorolac (TORADOL) 10 MG tablet Take 1 tablet (10 mg total) by mouth every 6 (six) hours as needed for moderate pain or severe pain.  . meloxicam (MOBIC) 15 MG tablet Take 1 tablet (15 mg total) by mouth daily.  . nortriptyline (PAMELOR) 10 MG capsule TAKE 1 CAPSULE (10 MG TOTAL) BY MOUTH AT BEDTIME.  Marland Kitchen ondansetron (ZOFRAN-ODT) 4 MG disintegrating tablet Take 1 tablet (4 mg total) by mouth every 8 (eight) hours as needed for nausea or vomiting.  . pantoprazole (PROTONIX) 40 MG tablet TAKE 1 TABLET BY MOUTH EVERY DAY  . SUMAtriptan (IMITREX) 100 MG tablet Take 1 tablet earliest onset of migraine.  May repeat in 2 hours if headache persists or recurs.  Maximum 2 tablets in 24 hours   No facility-administered encounter medications on file as of 02/10/2020.    Allergies: No Known Allergies  Family History: Family History  Problem Relation Age of Onset  . Hypertension Mother   . Depression Mother   . Hyperlipidemia  Mother   . Other Father        unknown medical history  . Depression Sister   . Anxiety disorder Sister   . Anxiety disorder Brother   . Eczema Daughter   . COPD Maternal Grandmother   . Emphysema Maternal Grandmother   . Cancer Maternal Grandfather        stomach cancer    Social History: Social History   Socioeconomic History  . Marital status: Single    Spouse name: Not on file  . Number of children: 1  . Years of education: Not on file  . Highest education level: Some college, no degree  Occupational History  . Occupation: Clinical support Engineer, production: [employer]  Tobacco Use  . Smoking status: Never Smoker  . Smokeless tobacco: Never Used  Vaping Use  . Vaping Use: Never used  Substance and Sexual Activity  . Alcohol use: Yes    Comment: occassional - 3 glasses wine/month  . Drug use: No  . Sexual activity: Yes    Birth control/protection: None  Other Topics Concern  . Not on file  Social History Narrative   Patient is right-handed. She lives in a two level home. She drinks coffee and tea 2-4 x a week. She does not exercise.   Social Determinants of Health   Financial Resource Strain:   . Difficulty of Paying Living Expenses: Not on file  Food Insecurity:   . Worried About Charity fundraiser in the Last Year: Not on file  . Ran Out of Food in the Last Year: Not on file  Transportation Needs:   . Lack of Transportation (Medical): Not on file  . Lack of Transportation (Non-Medical): Not on file  Physical Activity:   . Days of Exercise per Week: Not on file  . Minutes of Exercise per Session: Not on file  Stress:   . Feeling of  Stress : Not on file  Social Connections:   . Frequency of Communication with Friends and Family: Not on file  . Frequency of Social Gatherings with Friends and Family: Not on file  . Attends Religious Services: Not on file  . Active Member of Clubs or Organizations: Not on file  . Attends Archivist Meetings:  Not on file  . Marital Status: Not on file  Intimate Partner Violence:   . Fear of Current or Ex-Partner: Not on file  . Emotionally Abused: Not on file  . Physically Abused: Not on file  . Sexually Abused: Not on file    Observations/Objective:   Height 5\' 6"  (1.676 m), weight 270 lb (122.5 kg). No acute distress.  Alert and oriented.  Speech fluent and not dysarthric.  Language intact.    Assessment and Plan:   Migraine without aura, without status migrainosus, intractable.  Migraines infrequent but still difficult to break.  1. Will have her try Tosymra NS as rescue.  Advised to discontinue sumatriptan tablet. 2.  To try and reduced intensity/duration of migraines to become more responsive to rescue medication, will also increase nortriptyline to 25mg  at bedtime. 3.  Limit use of pain relievers to no more than 2 days out of week to prevent risk of rebound or medication-overuse headache. 4.  Keep headache diary 5.  Follow up 4 to 6 months.  Follow Up Instructions:    -I discussed the assessment and treatment plan with the patient. The patient was provided an opportunity to ask questions and all were answered. The patient agreed with the plan and demonstrated an understanding of the instructions.   The patient was advised to call back or seek an in-person evaluation if the symptoms worsen or if the condition fails to improve as anticipated.    Dudley Major, DO

## 2020-02-10 ENCOUNTER — Encounter: Payer: Self-pay | Admitting: Neurology

## 2020-02-10 ENCOUNTER — Telehealth (INDEPENDENT_AMBULATORY_CARE_PROVIDER_SITE_OTHER): Payer: BC Managed Care – PPO | Admitting: Neurology

## 2020-02-10 ENCOUNTER — Other Ambulatory Visit: Payer: Self-pay

## 2020-02-10 VITALS — Ht 66.0 in | Wt 270.0 lb

## 2020-02-10 DIAGNOSIS — G43019 Migraine without aura, intractable, without status migrainosus: Secondary | ICD-10-CM

## 2020-02-10 MED ORDER — NORTRIPTYLINE HCL 25 MG PO CAPS
25.0000 mg | ORAL_CAPSULE | Freq: Every day | ORAL | 5 refills | Status: DC
Start: 2020-02-10 — End: 2020-03-12

## 2020-02-10 MED ORDER — TOSYMRA 10 MG/ACT NA SOLN: 10.0000 mg | NASAL | 5 refills | Status: DC | PRN

## 2020-02-13 ENCOUNTER — Encounter: Payer: Self-pay | Admitting: Neurology

## 2020-02-13 NOTE — Progress Notes (Addendum)
Sherri Robertson (Key: [id number]) Tosymra 10MG /ACT solution   Form [employer] Electronic PA Form [phone] NCPDP) Status Denied   11/4- Faxed Appeal information to insurance.   Received fax approval from insurance valid from 02/17/20 to 02/16/21.

## 2020-03-11 ENCOUNTER — Other Ambulatory Visit: Payer: Self-pay | Admitting: Neurology

## 2020-03-29 NOTE — Progress Notes (Deleted)
Annual Exam-Pt

## 2020-03-30 ENCOUNTER — Encounter: Payer: BC Managed Care – PPO | Admitting: Obstetrics and Gynecology

## 2020-04-04 ENCOUNTER — Encounter: Payer: BC Managed Care – PPO | Admitting: Obstetrics and Gynecology

## 2020-04-04 NOTE — Progress Notes (Deleted)
Annual Exam

## 2020-04-10 ENCOUNTER — Encounter: Payer: Self-pay | Admitting: Obstetrics and Gynecology

## 2020-04-14 HISTORY — PX: LAPAROSCOPIC GASTRIC SLEEVE RESECTION: SHX5895

## 2020-04-24 ENCOUNTER — Telehealth (INDEPENDENT_AMBULATORY_CARE_PROVIDER_SITE_OTHER): Payer: BC Managed Care – PPO | Admitting: Family Medicine

## 2020-04-24 ENCOUNTER — Encounter: Payer: Self-pay | Admitting: Family Medicine

## 2020-04-24 ENCOUNTER — Other Ambulatory Visit: Payer: Self-pay

## 2020-04-24 DIAGNOSIS — R0989 Other specified symptoms and signs involving the circulatory and respiratory systems: Secondary | ICD-10-CM | POA: Diagnosis not present

## 2020-04-24 DIAGNOSIS — U071 COVID-19: Secondary | ICD-10-CM | POA: Diagnosis not present

## 2020-04-24 NOTE — Progress Notes (Signed)
Name: Sherri Robertson   MRN: [id number]    DOB: 1985-05-10   Date:04/24/2020       Progress Note  Subjective:    Chief Complaint  Chief Complaint  Patient presents with  . Covid Positive    Congested, fever, headache, sore throat since 1/6 and was tested on 1/7    I connected with  Sherri Robertson  on 04/24/20 at  3:40 PM EST by a video enabled telemedicine application and verified that I am speaking with the correct person using two identifiers.  I discussed the limitations of evaluation and management by telemedicine and the availability of in person appointments. The patient expressed understanding and agreed to proceed. Staff also discussed with the patient that there may be a patient responsible charge related to this service. Patient Location: home Provider Location: [facility] Additional Individuals present:  none HPI   Vaccinated x 2, sx started about 5 d ago, tested positive on 1/7 She has congestion, nasal sx, HA (history of HA's) sore throat, generally getting slowly better Associated fatigue Productive cough No SOB no CP   Patient Active Problem List   Diagnosis Date Noted  . Migraine without aura and without status migrainosus, not intractable 12/01/2018  . Gastroesophageal reflux disease 12/01/2018  . Prediabetes 02/05/2017  . 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 Thyroid nodule 01/22/2017  . Vitamin D deficiency 01/22/2017  . Acanthosis nigricans 08/05/2015  . Anxiety and depression 01/26/2015  . Dysmenorrhea 01/26/2015  . Fibroid 01/26/2015  . Anemia, iron deficiency 09/21/2009  . Obesity, morbid, BMI 50 or higher ([place]) 07/27/2009    Social History   Tobacco Use  . Smoking status: Never Smoker  . Smokeless tobacco: Never Used  Substance Use Topics  . Alcohol use: Yes    Comment: occassional - 3 glasses wine/month     Current Outpatient Medications:  .  bismuth subsalicylate (PEPTO BISMOL) 262 MG/15ML suspension, Take 30 mLs by mouth every 6 (six)  hours as needed. , Disp: , Rfl:  .  Iron-Folic [id number] 150-1.25 MG TABS, Take 1 tablet by mouth at bedtime., Disp: 90 tablet, Rfl: 1 .  ondansetron (ZOFRAN-ODT) 4 MG disintegrating tablet, Take 1 tablet (4 mg total) by mouth every 8 (eight) hours as needed for nausea or vomiting., Disp: 20 tablet, Rfl: 0 .  pantoprazole (PROTONIX) 40 MG tablet, TAKE 1 TABLET BY MOUTH EVERY DAY, Disp: 90 tablet, Rfl: 1 .  SUMAtriptan (TOSYMRA) 10 MG/ACT SOLN, Place 10 mg into the nose as needed (May repeat after 1 hour.  Maximum 2 sprays/24 hours.)., Disp: 6 each, Rfl: 5 .  clobetasol ointment (TEMOVATE) 0.05 %, APPLY TO AFFECTED AREAS ON HANDS TWICE DAILY UNTIL CLEAR, THEN AS NEEDED (Patient not taking: Reported on 04/24/2020), Disp: 30 g, Rfl: 1 .  cyclobenzaprine (FLEXERIL) 5 MG tablet, Take 1 tablet (5 mg total) by mouth at bedtime. Take as needed (Patient not taking: Reported on 04/24/2020), Disp: 20 tablet, Rfl: 1 .  dicyclomine (BENTYL) 10 MG capsule, Take 1 capsule (10 mg total) by mouth 4 (four) times daily for 14 days., Disp: 56 capsule, Rfl: 0 .  ketorolac (TORADOL) 10 MG tablet, Take 1 tablet (10 mg total) by mouth every 6 (six) hours as needed for moderate pain or severe pain., Disp: 20 tablet, Rfl: 0 .  meloxicam (MOBIC) 15 MG tablet, Take 1 tablet (15 mg total) by mouth daily. (Patient not taking: Reported on 02/10/2020), Disp: 30 tablet, Rfl: 0 .  nortriptyline (PAMELOR) 25  MG capsule, TAKE 1 CAPSULE BY MOUTH AT BEDTIME., Disp: 90 capsule, Rfl: 1  Not on File  I personally reviewed active problem list, medication list, allergies, family history, social history, health maintenance, notes from last encounter, lab results, imaging with the patient/caregiver today.   Review of Systems  All other systems negative expect those noted in HPI  Objective:   Virtual encounter, vitals limited, only able to obtain the following There were no vitals filed for this visit. There is no height or weight  on file to calculate BMI. Nursing Note and Vital Signs reviewed.  Physical Exam Vitals and nursing note reviewed.  Constitutional:      General: She is not in acute distress.    Appearance: Normal appearance. She is not ill-appearing, toxic-appearing or diaphoretic.  Pulmonary:     Effort: No respiratory distress.  Neurological:     Mental Status: She is alert.  Psychiatric:        Mood and Affect: Mood normal.        Behavior: Behavior normal.     PE limited by telephone encounter  No results found for this or any previous visit (from the past 72 hour(s)).  Assessment and Plan:     ICD-10-CM   1. COVID  U07.1    onset sx 1/6, she works at [employer] they will contact her and monitor for RTW dates - reviewed and discussed CDC guideline changes and sx  2. Symptoms of upper respiratory infection (URI)  R09.89    nasal sx, congestion, mild productive cough w/o CP SOB or wheeze, improving, con't supportive and sx tx and OTC meds    Reviewed COVID home care, isolation protocol, concerning signs and sx that warrant urgent f/up  -Red flags and when to present for emergency care or RTC including fever >101.55F, chest pain, shortness of breath, new/worsening/un-resolving symptoms, reviewed with patient at time of visit. Follow up and care instructions discussed and provided in AVS. - I discussed the assessment and treatment plan with the patient. The patient was provided an opportunity to ask questions and all were answered. The patient agreed with the plan and demonstrated an understanding of the instructions.  I provided 20+ minutes of non-face-to-face time during this encounter.  Delsa Grana, PA-C 04/24/20 4:22 PM

## 2020-05-08 ENCOUNTER — Ambulatory Visit: Payer: BC Managed Care – PPO | Admitting: Family Medicine

## 2020-05-29 ENCOUNTER — Ambulatory Visit: Payer: BC Managed Care – PPO | Admitting: Family Medicine

## 2020-06-05 ENCOUNTER — Encounter: Payer: Self-pay | Admitting: Family Medicine

## 2020-06-05 ENCOUNTER — Other Ambulatory Visit: Payer: Self-pay

## 2020-06-05 ENCOUNTER — Ambulatory Visit (INDEPENDENT_AMBULATORY_CARE_PROVIDER_SITE_OTHER): Payer: BC Managed Care – PPO | Admitting: Family Medicine

## 2020-06-05 VITALS — BP 110/84 | HR 87 | Temp 98.0°F | Resp 16 | Ht 66.0 in | Wt 276.9 lb

## 2020-06-05 DIAGNOSIS — G43009 Migraine without aura, not intractable, without status migrainosus: Secondary | ICD-10-CM | POA: Diagnosis not present

## 2020-06-05 NOTE — Assessment & Plan Note (Signed)
Following with bariatric surgery. Congratulated and encouraged continued efforts.

## 2020-06-05 NOTE — Progress Notes (Signed)
   SUBJECTIVE:   CHIEF COMPLAINT / HPI:   MIGRAINE - long-standing history, previously seen by neuro, last visit 10/29, at that time increased nortriptyline and started sumatriptan soln, recommended headache diary, f/u 4-6 mths. - sumatriptan soln working some. Continues on nortriptyline (taking aobut 4 days out of the week due to working night shift), sumatriptan prn - occurring since [age]yo - has been keeping headache diary, trying to lose weight, pursuing bariatric surgery  Duration: years Onset: gradual Severity: moderate Quality: dull, pressure-like Frequency: 3-4 migraines per month Location: starts behind eyes Headache duration: 2-3 days Radiation: yes Time of day headache occurs: wakes with headaches Alleviating factors: rest, sumatriptan some Aggravating factors: lack of sleep Headache status at time of visit: asymptomatic Treatments attempted:  rest, APAP, ibuprofen, aleve", excedrine, triptans, topamax and amitriptyline   Aura: no Nausea:  yes Vomiting: no Photophobia:  yes Phonophobia:  yes Effect on social functioning:  yes Numbers of missed days of school/work each month: 2-3days Confusion:  no Gait disturbance/ataxia:  no Behavioral changes:  no Fevers:  no   OBJECTIVE:   BP 110/84   Pulse 87   Temp 98 F (36.7 C) (Oral)   Resp 16   Ht 5\' 6"  (1.676 m)   Wt 276 lb 14.4 oz (125.6 kg)   SpO2 100%   BMI 44.69 kg/m   Gen: well appearing, in NAD Card: RRR Lungs: CTAB Ext: WWP MSK: Full ROM, strength 5/5 to U/LE bilaterally, normal gait.  Neuro: Alert and oriented, speech normal. Optic field normal. PERRL, Extraocular movements intact.  Intact symmetric sensation to light touch of face and extremities bilaterally.  Hearing grossly intact bilaterally.  Tongue protrudes normally with no deviation.  Shoulder shrug, smile symmetric. Finger to nose normal.  ASSESSMENT/PLAN:   Migraine without aura and without status migrainosus, not  intractable Persistent with 3-4 migraines per month and missing about 2-3 work days per month due to pain. No red flags on history or exam today. Working with neuro on appropriate regimen and pursuing lifestyle changes. Intermittent FMLA paperwork filled out today and will scan to chart and fax to job. Continue to follow with Neuro as scheduled.   Obesity, morbid, BMI 50 or higher ([place]) Following with bariatric surgery. Congratulated and encouraged continued efforts.    Myles Gip, DO

## 2020-06-05 NOTE — Patient Instructions (Signed)
It was great to see you!  Our plans for today:  - We will fill out your FMLA paperwork and fax to your job.  - Continue to follow up with Dr. Tomi Likens as scheduled.  - Make an appointment soon for an annual visit.   Take care and seek immediate care sooner if you develop any concerns.   Dr. Ky Barban

## 2020-06-05 NOTE — Assessment & Plan Note (Signed)
Persistent with 3-4 migraines per month and missing about 2-3 work days per month due to pain. No red flags on history or exam today. Working with neuro on appropriate regimen and pursuing lifestyle changes. Intermittent FMLA paperwork filled out today and will scan to chart and fax to job. Continue to follow with Neuro as scheduled.

## 2020-06-08 ENCOUNTER — Encounter: Payer: Self-pay | Admitting: Family Medicine

## 2020-08-08 NOTE — Progress Notes (Deleted)
NEUROLOGY FOLLOW UP OFFICE NOTE  Sherri Robertson [id number]  Assessment/Plan:   Migraine without aura, without status migrainosus, not intractable  1.  Migraine prevention:  *** 2.  Migraine rescue:  *** 3.  Limit use of pain relievers to no more than 2 days out of week to prevent risk of rebound or medication-overuse headache. 4.  Keep headache diary 5.  Follow up ***  Subjective:  Sherri Robertson is a 35year old female who follows up for migraines.  UPDATE: Nortriptyline was increased in October. Intensity:  *** Duration:  *** Frequency:  ***   Current NSAIDS:none Current analgesics:none Current triptans:Tosymra NS Current ergotamine:none Current anti-emetic:none Current muscle relaxants:none Current anti-anxiolytic:none Current sleep aide:none Current Antihypertensive medications:none Current Antidepressant medications:nortriptyline 25mg  at bedtime Current Anticonvulsant medications:none Current anti-CGRP:none Current Vitamins/Herbal/Supplements:D Current Antihistamines/Decongestants:none Other therapy:none Hormone/birth control:none  Caffeine:No coffee or soda Diet:Limited dairy intake.Hydrates. Exercise:Not routine Depression:no; Anxiety:stable Other pain:no Sleep hygiene:Good. 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 She would need to sleep it off in dark room with something over her eyes. They would occur every 3 to 4 months. However, they have getting more frequent in 2019. She went to the ED twice last year.  Location:Behind the eyes radiating to back of head (may be either side, mostly left eye) Quality:Pressure, pounding Initial intensity:8/10. Shedenies new headache, thunderclap headache Aura:no Premonitory Phase:no Postdrome:fatigue Associated symptoms:Photophobia, phonophobia, occasional nausea.Shedenies  associated visual disturbance,unilateral numbness or weakness. Initial duration:1 to 1 1/2 days. Normally occur when she gets up from sleep. Rarely occurs at work. Initial frequency:Twice a month. Initial frequency of abortive medication: Triggers:Dairy (cheese, milk), sleep deprivation Relieving factors:Something cool over her face Activity:aggravates  She has been to the ED a handful of times over the years due to headache. CT head from 07/22/13 and 02/20/16 were personally reviewed and were normal.   Past NSAIDS:naproxen, ibuprofen Past analgesics:Fioricet, acetaminophen Past abortive triptans:rizatriptan 10mg , sumatriptan 100mg  Past abortive ergotamine:none Past muscle relaxants:cyclobenzaprine Past anti-emetic:Reglan Past antihypertensive medications:none Past antidepressant medications:none Past anticonvulsant medications:topiramate 50mg  (anxiety, irritability) Past anti-CGRP:Nurtec (rescue - helped) Past vitamins/Herbal/Supplements:none Past antihistamines/decongestants:none Other past therapies:none   Family history of headache:no  PAST MEDICAL HISTORY: Past Medical History:  Diagnosis Date  . Abnormal cervical Pap smear with positive HPV DNA test 01/26/2015  . Anemia   . GERD (gastroesophageal reflux disease)   . Low back pain 01/26/2015  . Migraine     MEDICATIONS: Current Outpatient Medications on File Prior to Visit  Medication Sig Dispense Refill  . bismuth subsalicylate (PEPTO BISMOL) 262 MG/15ML suspension Take 30 mLs by mouth every 6 (six) hours as needed.  (Patient not taking: Reported on 06/05/2020)    . Cholecalciferol 75 MCG (3000 UT) TABS Take 1 tablet by mouth daily.    . clarithromycin (BIAXIN) 500 MG tablet Take 500 mg by mouth 2 (two) times daily.    . clobetasol ointment (TEMOVATE) 0.05 % APPLY TO AFFECTED AREAS ON HANDS TWICE DAILY UNTIL CLEAR, THEN AS NEEDED (Patient not taking: Reported on  06/05/2020) 30 g 1  . cyclobenzaprine (FLEXERIL) 5 MG tablet Take 1 tablet (5 mg total) by mouth at bedtime. Take as needed 20 tablet 1  . dicyclomine (BENTYL) 10 MG capsule Take 1 capsule (10 mg total) by mouth 4 (four) times daily for 14 days. 56 capsule 0  . Iron-Folic [id number] 150-1.25 MG TABS Take 1 tablet by mouth at bedtime. 90 tablet  1  . metFORMIN (GLUCOPHAGE-XR) 500 MG 24 hr tablet Take 500 mg by mouth every morning.    . metroNIDAZOLE (FLAGYL) 500 MG tablet Take 500 mg by mouth 2 (two) times daily.    . nortriptyline (PAMELOR) 25 MG capsule TAKE 1 CAPSULE BY MOUTH AT BEDTIME. 90 capsule 1  . omeprazole (PRILOSEC) 20 MG capsule Take 20 mg by mouth 2 (two) times daily.    . ondansetron (ZOFRAN-ODT) 4 MG disintegrating tablet Take 1 tablet (4 mg total) by mouth every 8 (eight) hours as needed for nausea or vomiting. 20 tablet 0  . pantoprazole (PROTONIX) 40 MG tablet TAKE 1 TABLET BY MOUTH EVERY DAY 90 tablet 1  . SUMAtriptan (TOSYMRA) 10 MG/ACT SOLN Place 10 mg into the nose as needed (May repeat after 1 hour.  Maximum 2 sprays/24 hours.). 6 each 5   No current facility-administered medications on file prior to visit.    ALLERGIES: Not on File  FAMILY HISTORY: Family History  Problem Relation Age of Onset  . Hypertension Mother   . Depression Mother   . Hyperlipidemia Mother   . Other Father        unknown medical history  . Depression Sister   . Anxiety disorder Sister   . Anxiety disorder Brother   . Eczema Daughter   . COPD Maternal Grandmother   . Emphysema Maternal Grandmother   . Cancer Maternal Grandfather        stomach cancer      Objective:  *** General: No acute distress.  Patient appears well-groomed.   Head:  Normocephalic/atraumatic Eyes:  Fundi examined but not visualized Neck: supple, no paraspinal tenderness, full range of motion Heart:  Regular rate and rhythm Lungs:  Clear to auscultation bilaterally Back: No paraspinal  tenderness Neurological Exam: alert and oriented to person, place, and time. Attention span and concentration intact, recent and remote memory intact, fund of knowledge intact.  Speech fluent and not dysarthric, language intact.  CN II-XII intact. Bulk and tone normal, muscle strength 5/5 throughout.  Sensation to light touch, temperature and vibration intact.  Deep tendon reflexes 2+ throughout, toes downgoing.  Finger to nose and heel to shin testing intact.  Gait normal, Romberg negative.     Metta Clines, DO  CC: Delsa Grana, PA-C

## 2020-08-10 ENCOUNTER — Ambulatory Visit: Payer: BC Managed Care – PPO | Admitting: Neurology

## 2020-08-26 ENCOUNTER — Other Ambulatory Visit: Payer: Self-pay | Admitting: Family Medicine

## 2020-10-04 ENCOUNTER — Other Ambulatory Visit: Payer: Self-pay

## 2020-10-04 ENCOUNTER — Ambulatory Visit
Admission: RE | Admit: 2020-10-04 | Discharge: 2020-10-04 | Disposition: A | Discharge status: Home / Self Care | Payer: BC Managed Care – PPO | Source: Ambulatory Visit | Attending: Family Medicine | Admitting: Family Medicine

## 2020-10-04 ENCOUNTER — Encounter: Payer: Self-pay | Admitting: Hematology and Oncology

## 2020-10-04 VITALS — HR 93 | Temp 98.0°F | Resp 16 | Ht 61.0 in | Wt 272.0 lb

## 2020-10-04 DIAGNOSIS — J029 Acute pharyngitis, unspecified: Secondary | ICD-10-CM | POA: Diagnosis present

## 2020-10-04 LAB — POCT RAPID STREP A: Streptococcus, Group A Screen (Direct): NEGATIVE

## 2020-10-04 MED ORDER — ONDANSETRON 4 MG PO TBDP: 4.0000 mg | ORAL_TABLET | Freq: Three times a day (TID) | ORAL | 0 refills | Status: DC | PRN

## 2020-10-04 NOTE — Discharge Instructions (Addendum)
Rest. Fluids. ° °Medication as needed. ° °Take care ° °Dr. Avarey Yaeger  °

## 2020-10-04 NOTE — ED Provider Notes (Signed)
[facility]    CSN: [id number] Arrival date & time: 10/04/20  1651      History   Chief Complaint Chief Complaint  Patient presents with   Sore Throat    HPI 35 year old female presents with sore throat.  2 day history of sore throat. Associated nausea. No vomiting. No fever. Daughter has strep. Mother concerned about strep. Requesting testing today. Pain 5/10 in severity. No relieving factors. No other complaints.   Past Medical History:  Diagnosis Date   Abnormal cervical Pap smear with positive HPV DNA test 01/26/2015   Anemia    GERD (gastroesophageal reflux disease)    Low back pain 01/26/2015   Migraine     Patient Active Problem List   Diagnosis Date Noted   Migraine without aura and without status migrainosus, not intractable 12/01/2018   Gastroesophageal reflux disease 12/01/2018   Prediabetes 02/05/2017   Lipoma of back 02/05/2017   Thyroid nodule 01/22/2017   Vitamin D deficiency 01/22/2017   Acanthosis nigricans 08/05/2015   Anxiety and depression 01/26/2015   Dysmenorrhea 01/26/2015   Fibroid 01/26/2015   Anemia, iron deficiency 09/21/2009   Obesity, morbid, BMI 50 or higher ([place]) 07/27/2009    Past Surgical History:  Procedure Laterality Date   CESAREAN SECTION     TONSILLECTOMY      OB History     Gravida  1   Para  1   Term  1   Preterm      AB      Living         SAB      IAB      Ectopic      Multiple      Live Births               Home Medications    Prior to Admission medications   Medication Sig Start Date End Date Taking? Authorizing Provider  ondansetron (ZOFRAN ODT) 4 MG disintegrating tablet Take 1 tablet (4 mg total) by mouth every 8 (eight) hours as needed for nausea or vomiting. 10/04/20  Yes Thersa Salt G, DO  bismuth subsalicylate (PEPTO BISMOL) 262 MG/15ML suspension Take 30 mLs by mouth every 6 (six) hours as needed.  Patient not taking: Reported on 06/05/2020    [provider]  Cholecalciferol 75 MCG (3000 UT) TABS Take 1 tablet by mouth daily. 05/29/20   [provider]  clarithromycin (BIAXIN) 500 MG tablet Take 500 mg by mouth 2 (two) times daily. 06/04/20   [provider]  clobetasol ointment (TEMOVATE) 0.05 % APPLY TO AFFECTED AREAS ON HANDS TWICE DAILY UNTIL CLEAR, THEN AS NEEDED 08/27/20   Delsa Grana, PA-C  cyclobenzaprine (FLEXERIL) 5 MG tablet Take 1 tablet (5 mg total) by mouth at bedtime. Take as needed 12/06/19   Towanda Malkin, MD  dicyclomine (BENTYL) 10 MG capsule Take 1 capsule (10 mg total) by mouth 4 (four) times daily for 14 days. 12/10/19 12/24/19  Cuthriell, Charline Bills, PA-C  Iron-Folic [id number] 150-1.25 MG TABS Take 1 tablet by mouth at bedtime. 03/04/19   Delsa Grana, PA-C  metFORMIN (GLUCOPHAGE-XR) 500 MG 24 hr tablet Take 500 mg by mouth every morning. 05/21/20   [provider]  metroNIDAZOLE (FLAGYL) 500 MG tablet Take 500 mg by mouth 2 (two) times daily. 06/04/20   [provider]  nortriptyline (PAMELOR) 25 MG capsule TAKE 1 CAPSULE BY MOUTH AT BEDTIME. 03/12/20   Pieter Partridge, DO  omeprazole (PRILOSEC) 20 MG capsule Take 20 mg by mouth 2 (two) times daily. 06/04/20   [provider]  pantoprazole (PROTONIX) 40 MG tablet TAKE 1 TABLET BY MOUTH EVERY DAY 05/31/19   Delsa Grana, PA-C  SUMAtriptan (TOSYMRA) 10 MG/ACT SOLN Place 10 mg into the nose as needed (May repeat after 1 hour.  Maximum 2 sprays/24 hours.). 02/10/20   Pieter Partridge, DO    Family History Family History  Problem Relation Age of Onset   Hypertension Mother    Depression Mother    Hyperlipidemia Mother    Other Father        unknown medical history   Depression Sister    Anxiety disorder Sister    Anxiety disorder Brother    Eczema Daughter    COPD Maternal Grandmother    Emphysema Maternal Grandmother    Cancer Maternal Grandfather        stomach cancer    Social History Social History   Tobacco  Use   Smoking status: Never   Smokeless tobacco: Never  Vaping Use   Vaping Use: Never used  Substance Use Topics   Alcohol use: Yes    Comment: occassional - 3 glasses wine/month   Drug use: No     Allergies   Patient has no known allergies.   Review of Systems Review of Systems  HENT:  Positive for sore throat.   Gastrointestinal:  Positive for nausea.   Physical Exam Triage Vital Signs ED Triage Vitals [10/04/20 1701]  Enc Vitals Group     BP      Pulse Rate 93     Resp 16     Temp 98 F (36.7 C)     Temp Source Oral     SpO2 100 %     Weight 272 lb (123.4 kg)     Height 5\' 1"  (1.549 m)     Head Circumference      Peak Flow      Pain Score 5     Pain Loc      Pain Edu?      Excl. in [place]?    Updated Vital Signs Pulse 93   Temp 98 F (36.7 C) (Oral)   Resp 16   Ht 5\' 1"  (1.549 m)   Wt 123.4 kg   SpO2 100%   BMI 51.39 kg/m   Visual Acuity Right Eye Distance:   Left Eye Distance:   Bilateral Distance:    Right Eye Near:   Left Eye Near:    Bilateral Near:     Physical Exam Constitutional:      General: She is not in acute distress.    Appearance: Normal appearance. She is obese. She is not ill-appearing.  HENT:     Head: Normocephalic and atraumatic.     Right Ear: Tympanic membrane normal.     Left Ear: Tympanic membrane normal.     Mouth/Throat:     Pharynx: Oropharynx is clear. No oropharyngeal exudate or posterior oropharyngeal erythema.  Cardiovascular:     Rate and Rhythm: Normal rate and regular rhythm.     Heart sounds: No murmur heard. Pulmonary:     Effort: Pulmonary effort is normal.     Breath sounds: Normal breath sounds. No wheezing or rales.  Neurological:     Mental Status: She is alert.  Psychiatric:        Mood and Affect: Mood normal.        Behavior: Behavior normal.  UC Treatments / Results  Labs (all labs ordered are listed, but only abnormal results are displayed) Labs Reviewed  CULTURE, GROUP A STREP  [facility])  POCT RAPID STREP A, ED / UC  POCT RAPID STREP A    EKG   Radiology No results found.  Procedures Procedures (including critical care time)  Medications Ordered in UC Medications - No data to display  Initial Impression / Assessment and Plan / UC Course  I have reviewed the triage vital signs and the nursing notes.  Pertinent labs & imaging results that were available during my care of the patient were reviewed by me and considered in my medical decision making (see chart for details).    35 year old female presents with sore throat. Strep negative. Awaiting culture. Zofran as needed for nausea. Supportive care.   Final Clinical Impressions(s) / UC Diagnoses   Final diagnoses:  Pharyngitis, unspecified etiology     Discharge Instructions      Rest. Fluids.  Medication as needed.  Take care  Dr. Lacinda Axon    ED Prescriptions     Medication Sig Dispense Auth. Provider   ondansetron (ZOFRAN ODT) 4 MG disintegrating tablet Take 1 tablet (4 mg total) by mouth every 8 (eight) hours as needed for nausea or vomiting. 20 tablet Coral Spikes, DO      PDMP not reviewed this encounter.   Coral Spikes, [place] 10/04/20 1818

## 2020-10-04 NOTE — ED Triage Notes (Signed)
Pt reports having sore throat x2 days.

## 2020-10-07 LAB — CULTURE, GROUP A STREP (THRC)

## 2020-11-08 ENCOUNTER — Ambulatory Visit: Payer: BC Managed Care – PPO | Admitting: Family Medicine

## 2020-11-16 ENCOUNTER — Ambulatory Visit: Payer: BC Managed Care – PPO | Admitting: Family Medicine

## 2020-11-16 NOTE — Progress Notes (Deleted)
Patient ID: DELZORA ADOLF, female    DOB: July 31, 1985, 35 y.o.   MRN: [id number]  PCP: Delsa Grana, PA-C  No chief complaint on file.   Subjective:   Linnie KEZIYAH PENIX is a 35 y.o. female, presents to clinic with CC of the following:  HPI    Patient Active Problem List   Diagnosis Date Noted   Migraine without aura and without status migrainosus, not intractable 12/01/2018   Gastroesophageal reflux disease 12/01/2018   Prediabetes 02/05/2017   Lipoma of back 02/05/2017   Thyroid nodule 01/22/2017   Vitamin D deficiency 01/22/2017   Acanthosis nigricans 08/05/2015   Anxiety and depression 01/26/2015   Dysmenorrhea 01/26/2015   Fibroid 01/26/2015   Anemia, iron deficiency 09/21/2009   Obesity, morbid, BMI 50 or higher ([place]) 07/27/2009      Current Outpatient Medications:    bismuth subsalicylate (PEPTO BISMOL) 262 MG/15ML suspension, Take 30 mLs by mouth every 6 (six) hours as needed.  (Patient not taking: Reported on 06/05/2020), Disp: , Rfl:    Cholecalciferol 75 MCG (3000 UT) TABS, Take 1 tablet by mouth daily., Disp: , Rfl:    clarithromycin (BIAXIN) 500 MG tablet, Take 500 mg by mouth 2 (two) times daily., Disp: , Rfl:    clobetasol ointment (TEMOVATE) 0.05 %, APPLY TO AFFECTED AREAS ON HANDS TWICE DAILY UNTIL CLEAR, THEN AS NEEDED, Disp: 30 g, Rfl: 1   cyclobenzaprine (FLEXERIL) 5 MG tablet, Take 1 tablet (5 mg total) by mouth at bedtime. Take as needed, Disp: 20 tablet, Rfl: 1   dicyclomine (BENTYL) 10 MG capsule, Take 1 capsule (10 mg total) by mouth 4 (four) times daily for 14 days., Disp: 56 capsule, Rfl: 0   Iron-Folic [id number] 150-1.25 MG TABS, Take 1 tablet by mouth at bedtime., Disp: 90 tablet, Rfl: 1   metFORMIN (GLUCOPHAGE-XR) 500 MG 24 hr tablet, Take 500 mg by mouth every morning., Disp: , Rfl:    metroNIDAZOLE (FLAGYL) 500 MG tablet, Take 500 mg by mouth 2 (two) times daily., Disp: , Rfl:    nortriptyline (PAMELOR) 25 MG capsule, TAKE 1  CAPSULE BY MOUTH AT BEDTIME., Disp: 90 capsule, Rfl: 1   omeprazole (PRILOSEC) 20 MG capsule, Take 20 mg by mouth 2 (two) times daily., Disp: , Rfl:    ondansetron (ZOFRAN ODT) 4 MG disintegrating tablet, Take 1 tablet (4 mg total) by mouth every 8 (eight) hours as needed for nausea or vomiting., Disp: 20 tablet, Rfl: 0   pantoprazole (PROTONIX) 40 MG tablet, TAKE 1 TABLET BY MOUTH EVERY DAY, Disp: 90 tablet, Rfl: 1   SUMAtriptan (TOSYMRA) 10 MG/ACT SOLN, Place 10 mg into the nose as needed (May repeat after 1 hour.  Maximum 2 sprays/24 hours.)., Disp: 6 each, Rfl: 5   No Known Allergies   Social History   Tobacco Use   Smoking status: Never   Smokeless tobacco: Never  Vaping Use   Vaping Use: Never used  Substance Use Topics   Alcohol use: Yes    Comment: occassional - 3 glasses wine/month   Drug use: No      Chart Review Today: ***  Review of Systems     Objective:   There were no vitals filed for this visit.  There is no height or weight on file to calculate BMI.  Physical Exam   Results for orders placed or performed during the hospital encounter of 10/04/20  Culture, group A strep   Specimen: Throat  Result Value Ref Range  Specimen Description      THROAT Performed at [facility], [address] [place], [place] [address]    Special Requests      NONE Performed at [facility], [address] [place], [place] [address]    Culture      NO GROUP A STREP (S.PYOGENES) ISOLATED Performed at [facility], [place] [address], [place], [place] [address]    Report Status 10/07/2020 FINAL   POCT rapid strep A  Result Value Ref Range   Streptococcus, Group A Screen (Direct) NEGATIVE NEGATIVE       Assessment & Plan:   ***     Delsa Grana, PA-C 11/16/20 12:05 PM

## 2020-12-03 ENCOUNTER — Ambulatory Visit: Payer: BC Managed Care – PPO | Admitting: Family Medicine

## 2021-03-02 ENCOUNTER — Telehealth: Payer: Self-pay | Admitting: Family Medicine

## 2021-03-02 DIAGNOSIS — E559 Vitamin D deficiency, unspecified: Secondary | ICD-10-CM

## 2021-03-03 NOTE — Telephone Encounter (Signed)
Requested medication (s) are due for refill today: no  Requested medication (s) are on the active medication list: no  Last refill: dc'd  12/06/19 Winnifred Friar CMA  Future visit scheduled: no  Notes to clinic:  Nurse triage not delegated to refuse or refill this non delegated medication   Requested Prescriptions  Pending Prescriptions Disp Refills   Vitamin D, Ergocalciferol, (DRISDOL) 1.25 MG (50000 UNIT) CAPS capsule [Pharmacy Med Name: VITAMIN D2 1.25MG (50,000 UNIT)] 12 capsule 0    Sig: Take 1 capsule (50,000 Units total) by mouth every 7 (seven) days. x12 weeks.     Endocrinology:  Vitamins - Vitamin D Supplementation Failed - 03/02/2021  6:00 PM      Failed - 50,000 IU strengths are not delegated      Failed - Ca in normal range and within 360 days    Calcium  Date Value Ref Range Status  12/10/2019 8.5 (L) 8.9 - 10.3 mg/dL Final   Calcium, Total  Date Value Ref Range Status  06/13/2013 8.8 8.5 - 10.1 mg/dL Final          Failed - Phosphate in normal range and within 360 days    No results found for: PHOS        Failed - Vitamin D in normal range and within 360 days    Vit D, 25-Hydroxy  Date Value Ref Range Status  07/14/2019 12 (L) 30 - 100 ng/mL Final    Comment:    Vitamin D Status         25-OH Vitamin D: . Deficiency:                    <20 ng/mL Insufficiency:             20 - 29 ng/mL Optimal:                 > or = 30 ng/mL . For 25-OH Vitamin D testing on patients on  D2-supplementation and patients for whom quantitation  of D2 and D3 fractions is required, the [employer](TM) 25-OH VIT D, (D2,D3), LC/MS/MS is recommended: order  code [phone] (patients >59yrs). See Note 1 . Note 1 . For additional information, please refer to  http://education.[facility].com/faq/FAQ199  (This link is being provided for informational/ educational purposes only.)           Passed - Valid encounter within last 12 months    Recent Outpatient Visits            9 months ago Migraine without aura and without status migrainosus, not intractable   [place], DO   10 months ago [facility] Delsa Grana, PA-C   1 year ago Right upper quadrant abdominal pain   [facility] Lebron Conners D, MD   1 year ago Migraine without aura and without status migrainosus, not intractable   [facility] [place], Kristeen Miss, PA-C   1 year ago Migraine without aura and without status migrainosus, not intractable   [facility] Delsa Grana, [place]

## 2021-03-04 NOTE — Telephone Encounter (Signed)
Per nurse this medication is not on her list anymore and the patient said that she is not taking it anymore and that she did not requestg this medication refill.

## 2021-03-11 ENCOUNTER — Other Ambulatory Visit: Payer: Self-pay

## 2021-03-11 ENCOUNTER — Ambulatory Visit
Admission: EM | Admit: 2021-03-11 | Discharge: 2021-03-11 | Discharge status: Absent without leave | Payer: BC Managed Care – PPO

## 2021-05-22 ENCOUNTER — Encounter: Payer: BC Managed Care – PPO | Admitting: Obstetrics and Gynecology

## 2021-05-31 DIAGNOSIS — D649 Anemia, unspecified: Secondary | ICD-10-CM | POA: Insufficient documentation

## 2021-05-31 DIAGNOSIS — E611 Iron deficiency: Secondary | ICD-10-CM | POA: Insufficient documentation

## 2021-07-09 ENCOUNTER — Encounter: Payer: BC Managed Care – PPO | Admitting: Obstetrics and Gynecology

## 2021-07-20 IMAGING — CR DG LUMBAR SPINE 2-3V
1 series · 3 of 3 positions shown · non-contrast
Comparison: 02/15/2010

CLINICAL DATA: LEFT flank pain since [REDACTED], nonradiating, recent
constipation, no injury

EXAM:
LUMBAR SPINE - 2-3 VIEW

[Series 1: dg lumbar spine 2-3 views · 0.14mm/px · 3 of 3 slices shown]
[im 1/3]
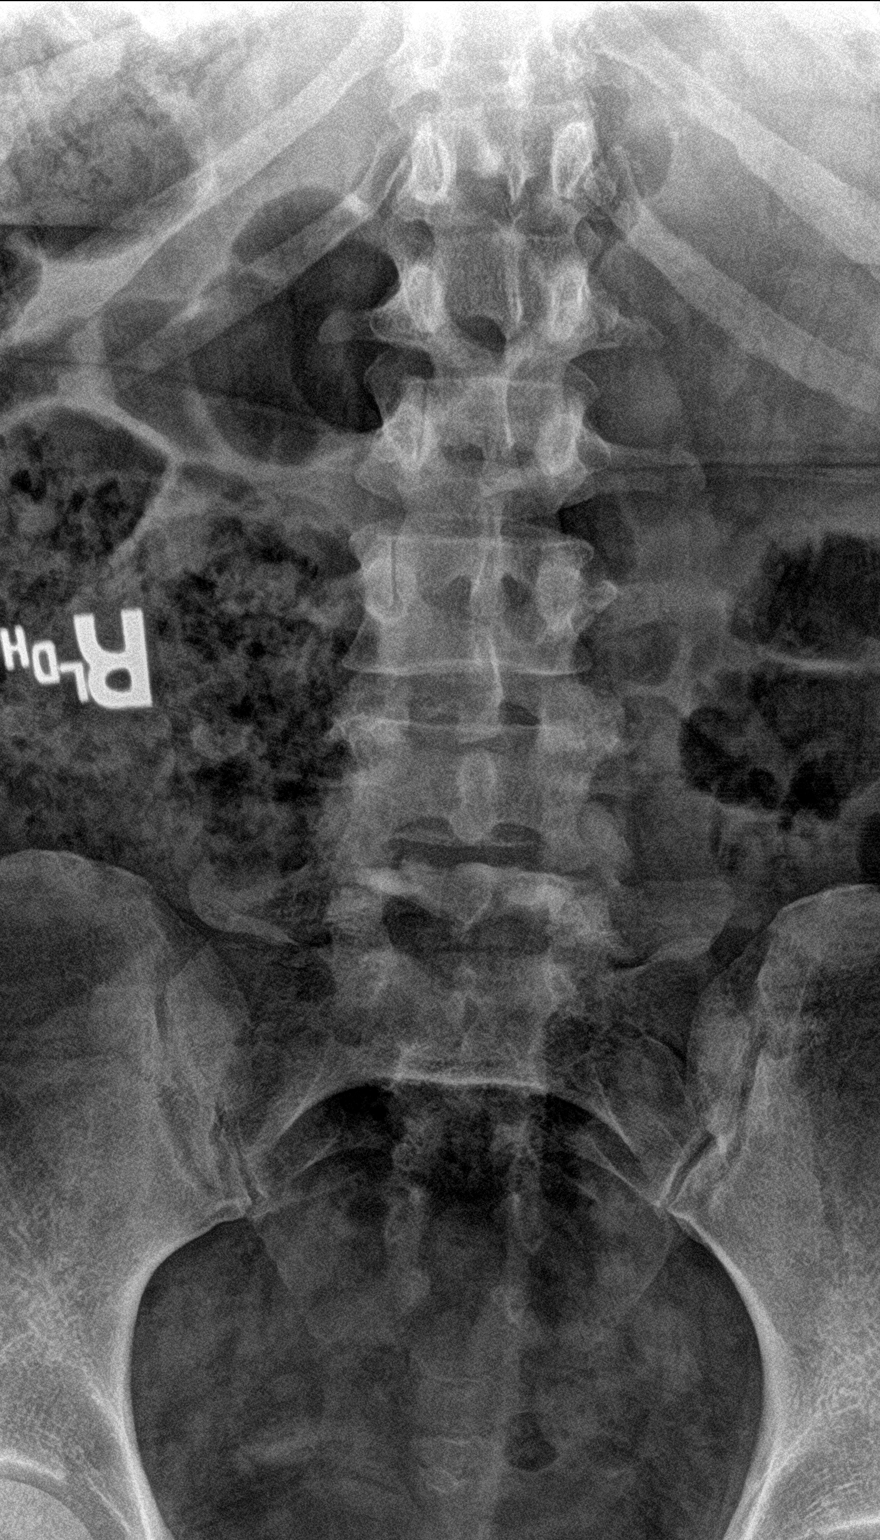
[im 2/3]
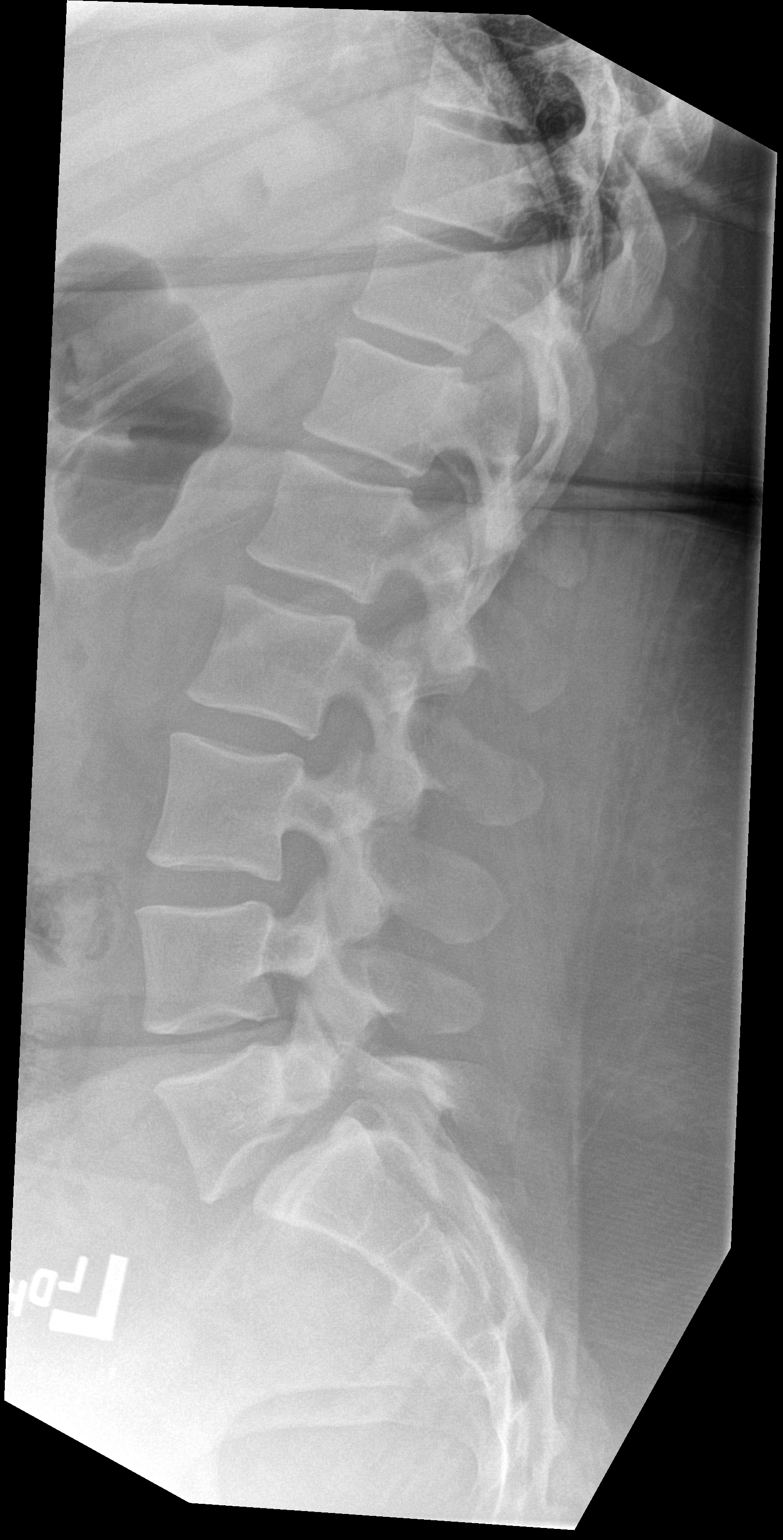
[im 3/3]
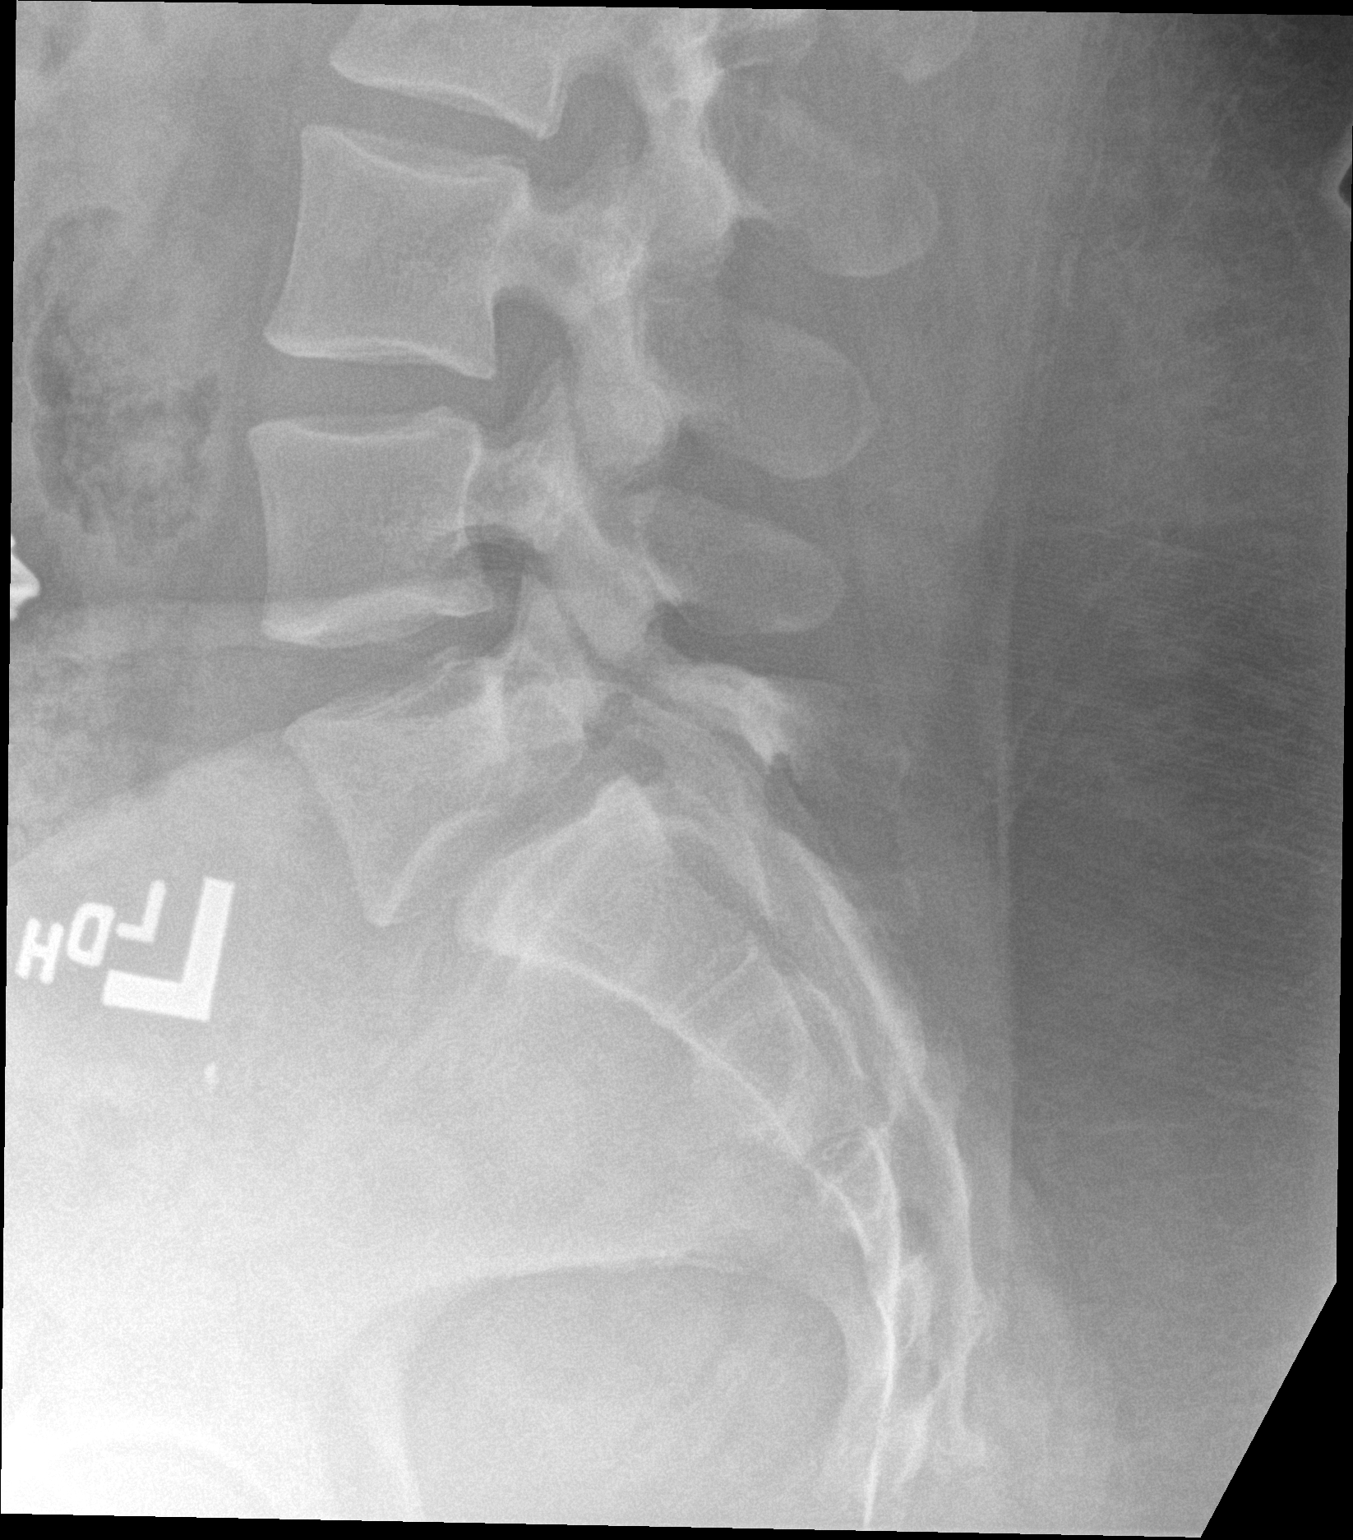

[3 of 3 positions shown; findings below may reference images not displayed]

FINDINGS: 5 non-rib-bearing lumbar vertebra.

Osseous mineralization normal.

Vertebral body and disc space heights maintained.

11 mm anterolisthesis L5-S1 secondary to BILATERAL spondylolysis L5,
increased from 8 mm.

No fracture, additional subluxation or bone destruction.

SI joints preserved.
IMPRESSION: Grade 2 spondylolisthesis L5-S1 secondary to BILATERAL spondylolysis
L5 increased since [DATE].

## 2021-07-20 IMAGING — CT CT RENAL STONE PROTOCOL
2 of 4 series · 17 of 46 positions shown, 19 images · non-contrast
Comparison: None.

CLINICAL DATA: Left flank pain, kidney stones suspected

EXAM:
CT ABDOMEN AND PELVIS WITHOUT CONTRAST
TECHNIQUE: Multidetector CT imaging of the abdomen and pelvis was performed
following the standard protocol without IV contrast.

[Series 2: stone full standard · axial · 0.77mm/px · z∈[-500,-20]mm · 14 of 106 slices shown, 16 images]
[im 5/106  soft-tissue]
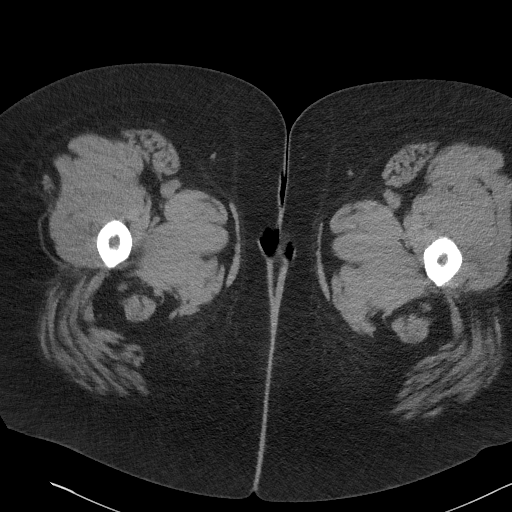
[im 5/106  bone]
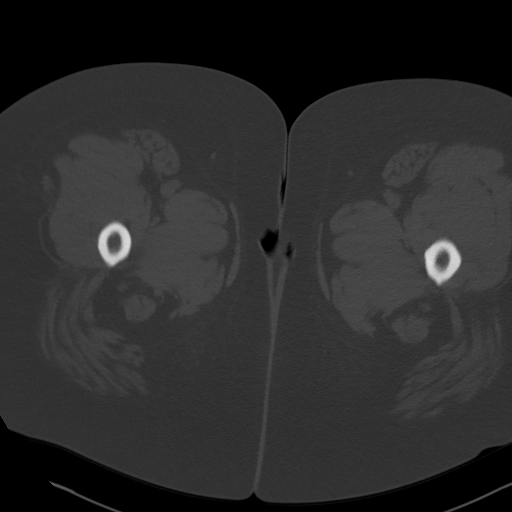
[im 14/106  soft-tissue]
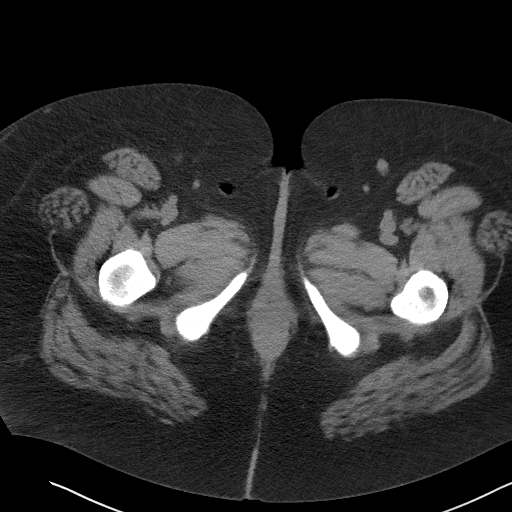
[im 22/106  soft-tissue]
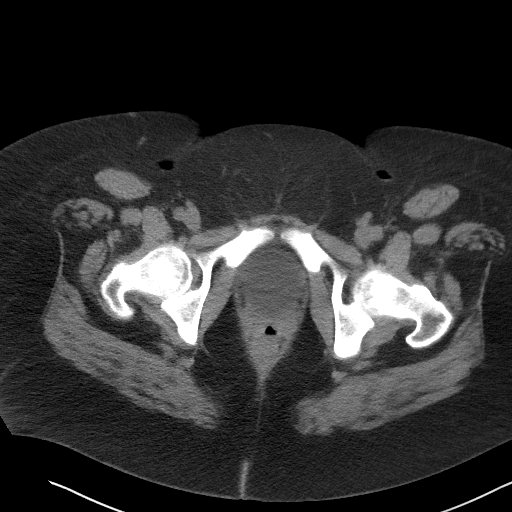
[im 27/106  soft-tissue]
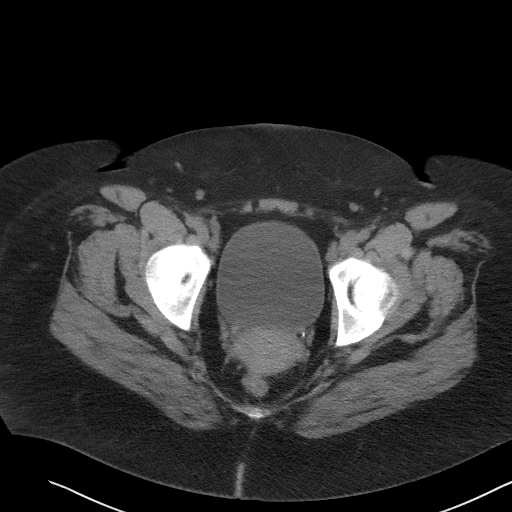
[im 36/106  soft-tissue]
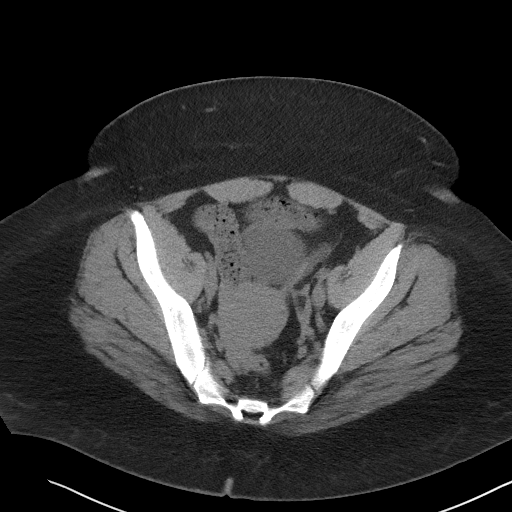
[im 44/106  soft-tissue]
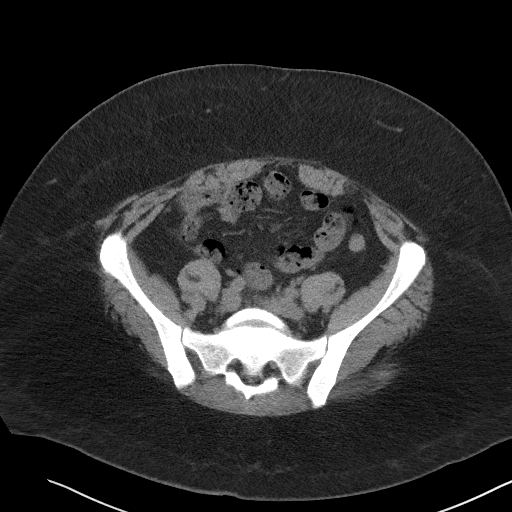
[im 49/106  soft-tissue]
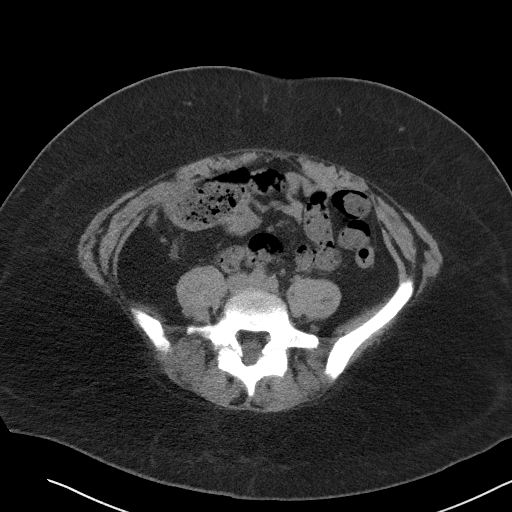
[im 57/106  soft-tissue]
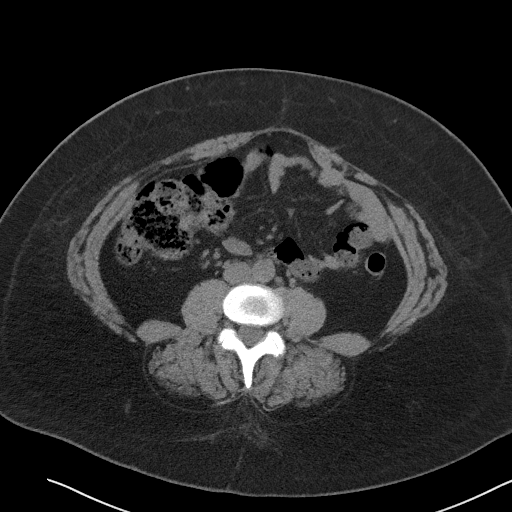
[im 62/106  soft-tissue]
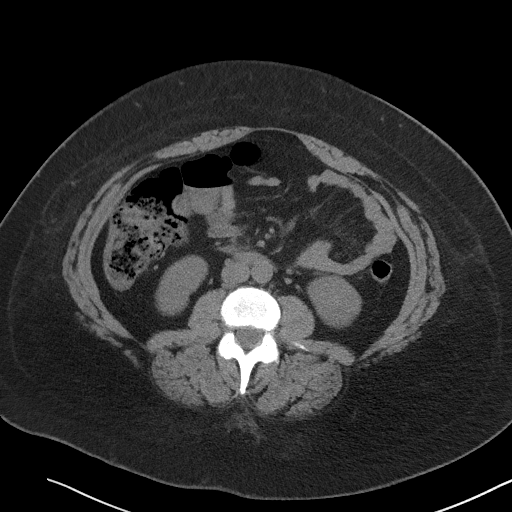
[im 62/106  bone]
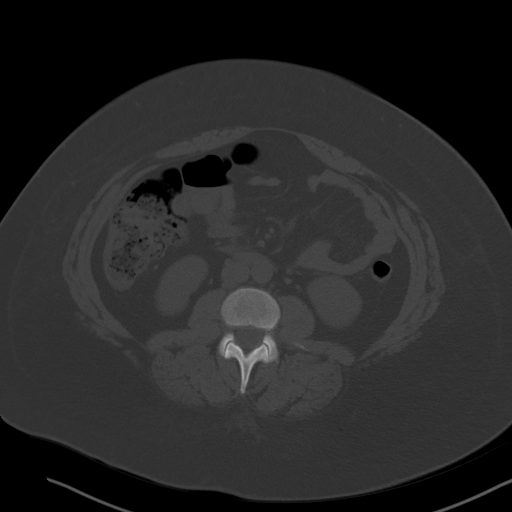
[im 71/106  soft-tissue]
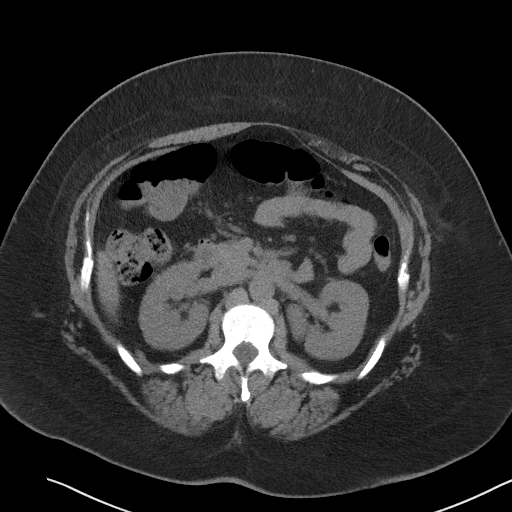
[im 79/106  soft-tissue]
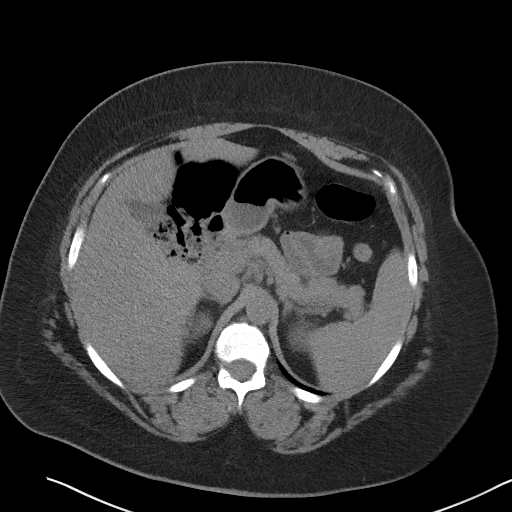
[im 84/106  soft-tissue]
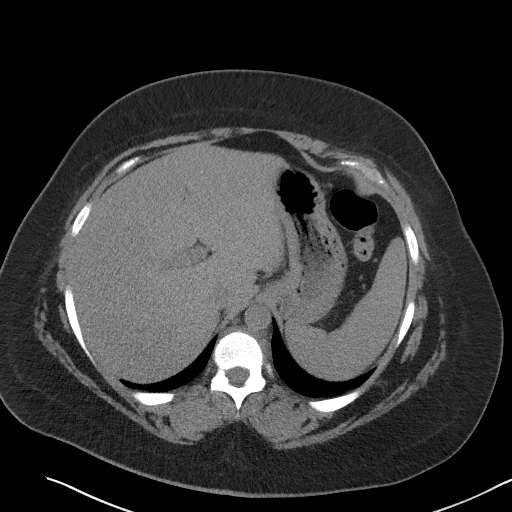
[im 92/106  soft-tissue]
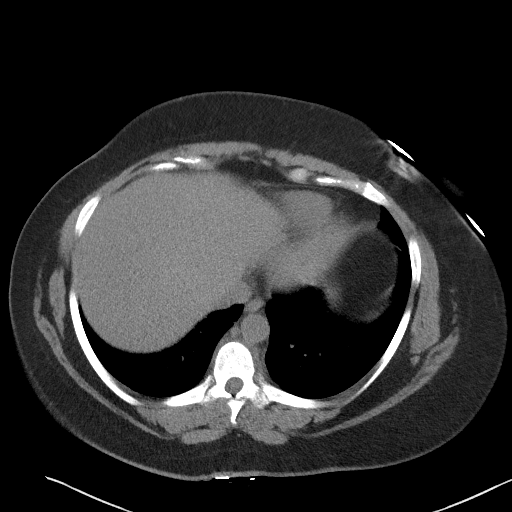
[im 101/106  soft-tissue]
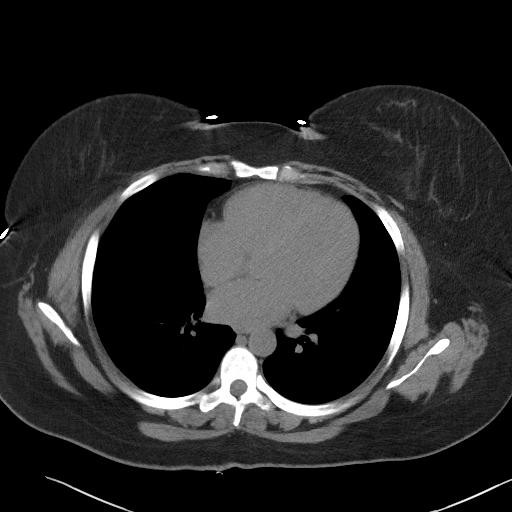

[Series 4: coronal · coronal · 0.98mm/px · 3 of 174 slices shown]
[im 58/174  soft-tissue]
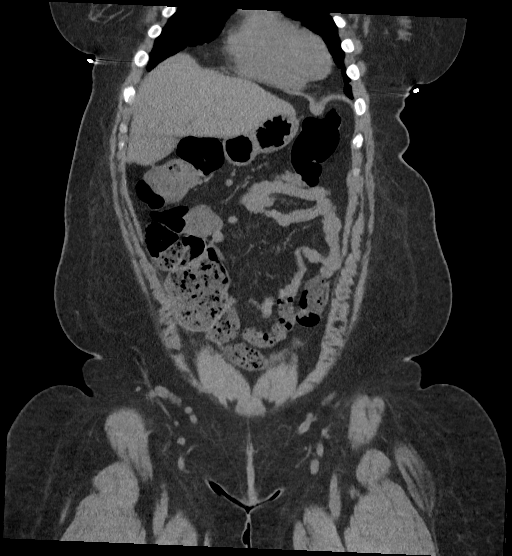
[im 77/174  soft-tissue]
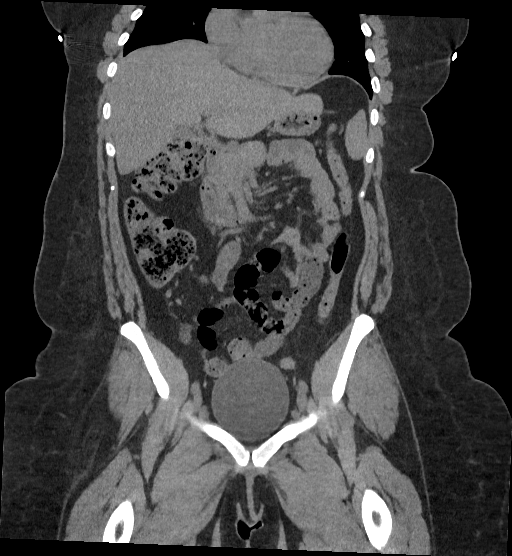
[im 97/174  soft-tissue]
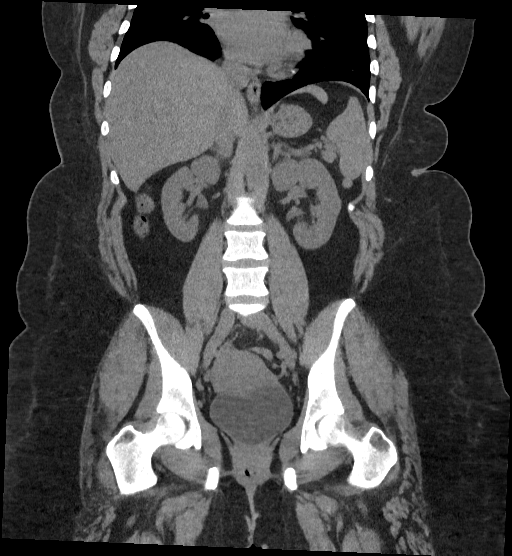

[17 of 46 positions shown; findings below may reference images not displayed]

FINDINGS: Lower chest: No acute abnormality.

Hepatobiliary: No solid liver abnormality is seen. No gallstones,
gallbladder wall thickening, or biliary dilatation.

Pancreas: Unremarkable. No pancreatic ductal dilatation or
surrounding inflammatory changes.

Spleen: Normal in size without significant abnormality.

Adrenals/Urinary Tract: Adrenal glands are unremarkable. Kidneys are
normal, without renal calculi, solid lesion, or hydronephrosis.
Bladder is unremarkable.

Stomach/Bowel: Stomach is within normal limits. Appendix appears
normal. No evidence of bowel wall thickening, distention, or
inflammatory changes.

Vascular/Lymphatic: No significant vascular findings are present. No
enlarged abdominal or pelvic lymph nodes.

Reproductive: No mass or other significant abnormality.

Other: No abdominal wall hernia or abnormality. Trace fluid in the
low pelvis, likely functional in the reproductive age setting.

Musculoskeletal: Bilateral pars defects of L5 with focal disc space
height loss and minimal degenerative anterolisthesis of L5 on S1.
IMPRESSION: 1. No evidence of urinary tract calculus or hydronephrosis.
2. Trace fluid in the low pelvis, likely functional in the
reproductive age setting.
3. Bilateral pars defects of L5.

## 2021-10-03 NOTE — Progress Notes (Incomplete)
Patient ID: LUS KRIEGEL, female    DOB: 1986-04-04, 36 y.o.   MRN: [id number]  PCP: Delsa Grana, PA-C  No chief complaint on file.   Subjective:   Sherri Robertson is a 36 y.o. female, presents to clinic with CC of the following:  Neck Pain   Back Pain  Pt has not been in for the past two years She has hx of migraines Anemia - referred to [facility] at [facility] frequently    Patient Active Problem List   Diagnosis Date Noted  . Migraine without aura and without status migrainosus, not intractable 12/01/2018  . Gastroesophageal reflux disease 12/01/2018  . Prediabetes 02/05/2017  . 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 Thyroid nodule 01/22/2017  . Vitamin D deficiency 01/22/2017  . Acanthosis nigricans 08/05/2015  . Anxiety and depression 01/26/2015  . Dysmenorrhea 01/26/2015  . Fibroid 01/26/2015  . Anemia, iron deficiency 09/21/2009  . Obesity, morbid, BMI 50 or higher ([place]) 07/27/2009      Current Outpatient Medications:  .  bismuth subsalicylate (PEPTO BISMOL) 262 MG/15ML suspension, Take 30 mLs by mouth every 6 (six) hours as needed.  (Patient not taking: Reported on 06/05/2020), Disp: , Rfl:  .  Cholecalciferol 75 MCG (3000 UT) TABS, Take 1 tablet by mouth daily., Disp: , Rfl:  .  clarithromycin (BIAXIN) 500 MG tablet, Take 500 mg by mouth 2 (two) times daily., Disp: , Rfl:  .  clobetasol ointment (TEMOVATE) 0.05 %, APPLY TO AFFECTED AREAS ON HANDS TWICE DAILY UNTIL CLEAR, THEN AS NEEDED, Disp: 30 g, Rfl: 1 .  cyclobenzaprine (FLEXERIL) 5 MG tablet, Take 1 tablet (5 mg total) by mouth at bedtime. Take as needed, Disp: 20 tablet, Rfl: 1 .  dicyclomine (BENTYL) 10 MG capsule, Take 1 capsule (10 mg total) by mouth 4 (four) times daily for 14 days., Disp: 56 capsule, Rfl: 0 .  Iron-Folic [id number] 150-1.25 MG TABS, Take 1 tablet by mouth at bedtime., Disp: 90 tablet, Rfl: 1 .  metFORMIN (GLUCOPHAGE-XR) 500 MG 24 hr tablet, Take 500 mg by mouth every  morning., Disp: , Rfl:  .  metroNIDAZOLE (FLAGYL) 500 MG tablet, Take 500 mg by mouth 2 (two) times daily., Disp: , Rfl:  .  nortriptyline (PAMELOR) 25 MG capsule, TAKE 1 CAPSULE BY MOUTH AT BEDTIME., Disp: 90 capsule, Rfl: 1 .  omeprazole (PRILOSEC) 20 MG capsule, Take 20 mg by mouth 2 (two) times daily., Disp: , Rfl:  .  ondansetron (ZOFRAN ODT) 4 MG disintegrating tablet, Take 1 tablet (4 mg total) by mouth every 8 (eight) hours as needed for nausea or vomiting., Disp: 20 tablet, Rfl: 0 .  pantoprazole (PROTONIX) 40 MG tablet, TAKE 1 TABLET BY MOUTH EVERY DAY, Disp: 90 tablet, Rfl: 1 .  SUMAtriptan (TOSYMRA) 10 MG/ACT SOLN, Place 10 mg into the nose as needed (May repeat after 1 hour.  Maximum 2 sprays/24 hours.)., Disp: 6 each, Rfl: 5   No Known Allergies   Social History   Tobacco Use  . Smoking status: Never  . Smokeless tobacco: Never  Vaping Use  . Vaping Use: Never used  Substance Use Topics  . Alcohol use: Yes    Comment: occassional - 3 glasses wine/month  . Drug use: No      Chart Review Today: ***  Review of Systems  Musculoskeletal:  Positive for back pain and neck pain.       Objective:   There were no vitals filed for this  visit.  There is no height or weight on file to calculate BMI.  Physical Exam   Results for orders placed or performed during the hospital encounter of 10/04/20  Culture, group A strep   Specimen: Throat  Result Value Ref Range   Specimen Description      THROAT Performed at [facility], [address] [place], [place] [address]    Special Requests      NONE Performed at [facility], [address], [place], [place] [address]    Culture      NO GROUP A STREP (S.PYOGENES) ISOLATED Performed at [facility], [place] [address], [place], [place] [address]    Report Status 10/07/2020 FINAL   POCT rapid strep A  Result Value Ref Range   Streptococcus, Group A Screen (Direct) NEGATIVE NEGATIVE        Assessment & Plan:   ***     Delsa Grana, PA-C 10/03/21 6:34 PM

## 2021-10-04 ENCOUNTER — Encounter: Payer: Self-pay | Admitting: Family Medicine

## 2021-10-04 ENCOUNTER — Ambulatory Visit (INDEPENDENT_AMBULATORY_CARE_PROVIDER_SITE_OTHER): Payer: BC Managed Care – PPO | Admitting: Family Medicine

## 2021-10-04 VITALS — BP 112/76 | HR 93 | Temp 98.3°F | Resp 16 | Ht 61.0 in | Wt 220.4 lb

## 2021-10-04 DIAGNOSIS — R222 Localized swelling, mass and lump, trunk: Secondary | ICD-10-CM | POA: Diagnosis not present

## 2021-10-04 DIAGNOSIS — M5441 Lumbago with sciatica, right side: Secondary | ICD-10-CM

## 2021-10-04 DIAGNOSIS — M542 Cervicalgia: Secondary | ICD-10-CM | POA: Diagnosis not present

## 2021-10-04 DIAGNOSIS — M546 Pain in thoracic spine: Secondary | ICD-10-CM

## 2021-10-04 MED ORDER — DEXAMETHASONE SODIUM PHOSPHATE 10 MG/ML IJ SOLN
10.0000 mg | Freq: Once | INTRAMUSCULAR | Status: AC
  Administered 2021-10-04: 10 mg via INTRAMUSCULAR

## 2021-10-04 MED ORDER — CYCLOBENZAPRINE HCL 5 MG PO TABS: 5.0000 mg | ORAL_TABLET | Freq: Every day | ORAL | 2 refills | Status: DC

## 2021-10-28 ENCOUNTER — Ambulatory Visit: Payer: BC Managed Care – PPO | Admitting: Surgery

## 2021-11-11 ENCOUNTER — Ambulatory Visit: Payer: BC Managed Care – PPO | Admitting: Surgery

## 2021-11-18 ENCOUNTER — Ambulatory Visit: Payer: BC Managed Care – PPO | Admitting: Surgery

## 2021-11-25 ENCOUNTER — Encounter: Payer: Self-pay | Admitting: Surgery

## 2021-11-25 ENCOUNTER — Ambulatory Visit (INDEPENDENT_AMBULATORY_CARE_PROVIDER_SITE_OTHER): Payer: BC Managed Care – PPO | Admitting: Surgery

## 2021-11-25 VITALS — BP 114/93 | HR 68 | Temp 98.0°F | Ht 61.0 in | Wt 212.0 lb

## 2021-11-25 DIAGNOSIS — D171 Benign lipomatous neoplasm of skin and subcutaneous tissue of trunk: Secondary | ICD-10-CM | POA: Diagnosis not present

## 2021-11-25 NOTE — Progress Notes (Signed)
11/25/2021  Reason for Visit:  Back lipoma  Requesting Provider:  Delsa Grana, PA-C  History of Present Illness: Sherri Robertson is a 36 y.o. female presenting for evaluation of a right upper back mass. The patient reports that it was noticed about 1-2 years ago during one of her physical exams.  Over the past year, she has lost a lot of weight and feels that the mass is much more noticeable now.  She's uncertain of the mass has grown making it more noticeable or if because of the weight loss, it's more visible.  Reports some discomfort when she lies down or sits back, and she tries to avoid pressure on that area.  She also reports some issues with back pain, but she's uncertain if related to the mass.  Denies any skin changes or drainage from that area.  Past Medical History: Past Medical History:  Diagnosis Date   Abnormal cervical Pap smear with positive HPV DNA test 01/26/2015   Anemia    GERD (gastroesophageal reflux disease)    Low back pain 01/26/2015   Migraine      Past Surgical History: Past Surgical History:  Procedure Laterality Date   CESAREAN SECTION     LAPAROSCOPIC GASTRIC SLEEVE RESECTION  2022   TONSILLECTOMY      Home Medications: Prior to Admission medications   Medication Sig Start Date End Date Taking? Authorizing Provider  clobetasol ointment (TEMOVATE) 0.05 % APPLY TO AFFECTED AREAS ON HANDS TWICE DAILY UNTIL CLEAR, THEN AS NEEDED 08/27/20  Yes Delsa Grana, PA-C  cyclobenzaprine (FLEXERIL) 5 MG tablet Take 1 tablet (5 mg total) by mouth at bedtime. Take as needed 10/04/21  Yes Delsa Grana, PA-C  Iron-Folic [id number] 150-1.25 MG TABS Take 1 tablet by mouth at bedtime. 03/04/19  Yes Delsa Grana, PA-C  SUMAtriptan (TOSYMRA) 10 MG/ACT SOLN Place 10 mg into the nose as needed (May repeat after 1 hour.  Maximum 2 sprays/24 hours.). 02/10/20  Yes Jaffe, Adam R, DO  WEGOVY 2.4 MG/0.75ML SOAJ Inject into the skin. 09/18/21  Yes [provider]     Allergies: No Known Allergies  Social History:  reports that she has never smoked. She has never been exposed to tobacco smoke. She has never used smokeless tobacco. She reports current alcohol use. She reports that she does not use drugs.   Family History: Family History  Problem Relation Age of Onset   Hypertension Mother    Depression Mother    Hyperlipidemia Mother    Other Father        unknown medical history   Depression Sister    Anxiety disorder Sister    Anxiety disorder Brother    Eczema Daughter    COPD Maternal Grandmother    Emphysema Maternal Grandmother    Cancer Maternal Grandfather        stomach cancer    Review of Systems: Review of Systems  Constitutional:  Negative for chills and fever.  HENT:  Negative for hearing loss.   Respiratory:  Negative for shortness of breath.   Cardiovascular:  Negative for chest pain.  Gastrointestinal:  Negative for abdominal pain, nausea and vomiting.  Skin:        Mass of upper back    Physical Exam BP (!) 114/93   Pulse 68   Temp 98 F (36.7 C)   Ht '5\' 1"'$  (1.549 m)   Wt 212 lb (96.2 kg)   LMP 11/25/2021 (Exact Date)   SpO2 100%   BMI  40.06 kg/m  CONSTITUTIONAL: No acute distress, well nourished. HEENT:  Normocephalic, atraumatic, extraocular motion intact.  RESPIRATORY:  Lungs are clear, and breath sounds are equal bilaterally. Normal respiratory effort without pathologic use of accessory muscles. CARDIOVASCULAR: Heart is regular without murmurs, gallops, or rubs. MUSCULOSKELETAL:  Normal muscle strength and tone in all four extremities.  No peripheral edema or cyanosis. SKIN: The patient has in the upper back, just above the bra strap, to the right of midline, a 4.5 cm mass that is mobile, soft, non-tender, without any overlying skin changes. NEUROLOGIC:  Motor and sensation is grossly normal.  Cranial nerves are grossly intact. PSYCH:  Alert and oriented to person, place and time. Affect is  normal.  Laboratory Analysis: No results found for this or any previous visit (from the past 24 hour(s)).  Imaging: No results found.  Assessment and Plan: This is a 36 y.o. female with likely an upper back lipoma  --Discussed with the patient that the mass feels like a lipoma.  It does not appear to be a cyst, and it is soft and mobile, more corresponding to a lipoma.  Discussed that these are benign masses that do not penetrate the muscle layer.  It truly may be that with her weight loss, the mass is now more noticeable as these do not get affected much by weight changes.  However, it could also be that it has grown in size overall.  It does cause some symptoms when lying or sitting back, and the patient wishes to have the mass excised. --Gave the patient the option of in-office vs OR excision, and she has opted for in-office procedure.  Discussed the procedure at length with her including the incision, the risks of bleeding, infection, injury to surrounding structures, activity restrictions, pain control, and she's willing to proceed. --Will schedule her for office excision of her back lipoma in 2 weeks.  I spent 30 minutes dedicated to the care of this patient on the date of this encounter to include pre-visit review of records, face-to-face time with the patient discussing diagnosis and management, and any post-visit coordination of care.   Melvyn Neth, [facility]

## 2021-11-25 NOTE — Patient Instructions (Signed)
Lipoma Removal  Lipoma removal is a surgical procedure to remove a lipoma, which is a noncancerous (benign) tumor that is made up of fat cells. Most lipomas are small and painless and do not require treatment. They can form in many areas of the body but are most common under the skin of the back, arms, shoulders, buttocks, and thighs. You may need lipoma removal if you have a lipoma that is large, growing, or causing discomfort. Lipoma removal may also be done for cosmetic reasons. Tell a health care provider about: Any allergies you have. All medicines you are taking, including vitamins, herbs, eye drops, creams, and over-the-counter medicines. Any problems you or family members have had with anesthetic medicines. Any bleeding problems you have. Any surgeries you have had. Any medical conditions you have. Whether you are pregnant or may be pregnant. What are the risks? Generally, this is a safe procedure. However, problems may occur, including: Infection. Bleeding. Scarring. Allergic reactions to medicines. Damage to nearby structures or organs, such as damage to nerves or blood vessels near the lipoma. What happens before the procedure? Medicines Ask your health care provider about: Changing or stopping your regular medicines. This is especially important if you are taking diabetes medicines or blood thinners. Taking medicines such as aspirin and ibuprofen. These medicines can thin your blood. Do not take these medicines unless your health care provider tells you to take them. Taking over-the-counter medicines, vitamins, herbs, and supplements. General instructions You will have a physical exam. Your health care provider will check the size of the lipoma and whether it can be removed easily. You may have a biopsy and imaging tests, such as X-rays, a CT scan, and an MRI. Do not use any products that contain nicotine or tobacco for at least 4 weeks before the procedure. These products  include cigarettes, chewing tobacco, and vaping devices, such as e-cigarettes. If you need help quitting, ask your health care provider. Ask your health care provider: How your surgery site will be marked. What steps will be taken to help prevent infection. These may include: Washing skin with a germ-killing soap. Taking antibiotic medicine. If you will be going home right after the procedure, plan to have a responsible adult: Take you home from the hospital or clinic. You will not be allowed to drive. Care for you for the time you are told. What happens during the procedure?  You will be given one or more of the following: A medicine to numb the area (local anesthetic). A medicine that is injected into an area of your body to numb everything below the injection site (regional anesthetic). An incision will be made into the skin over the lipoma or very near the lipoma. The incision may be made in a natural skin line or crease. Tissues, nerves, and blood vessels near the lipoma will be moved out of the way. The lipoma and the capsule that surrounds it will be separated from the surrounding tissues. The lipoma will be removed. The incision may be closed with stitches (sutures). A bandage (dressing) will be placed over the incision. The procedure may vary among health care providers and hospitals. What happens after the procedure? Your blood pressure, heart rate, breathing rate, and blood oxygen level will be monitored until you leave the hospital or clinic. If you were prescribed an antibiotic medicine, use it as told by your health care provider. Do not stop using the antibiotic even if you start to feel better. Where to find more  information OrthoInfo: orthoinfo.aaos.org Summary Before the procedure, follow instructions from your health care provider about eating and drinking, and changing or stopping your regular medicines. This is especially important if you are taking diabetes medicines  or blood thinners. After the lipoma is removed, the incision may be closed with stitches (sutures) and covered with a bandage (dressing). If you were given a sedative during the procedure, it can affect you for several hours. Do not drive or operate machinery until your health care provider says that it is safe. This information is not intended to replace advice given to you by your health care provider. Make sure you discuss any questions you have with your health care provider. Document Revised: 04/19/2021 Document Reviewed: 04/19/2021 Elsevier Patient Education  [place].

## 2021-12-09 ENCOUNTER — Ambulatory Visit: Payer: PRIVATE HEALTH INSURANCE | Admitting: Surgery

## 2021-12-23 ENCOUNTER — Ambulatory Visit (INDEPENDENT_AMBULATORY_CARE_PROVIDER_SITE_OTHER): Payer: BC Managed Care – PPO | Admitting: Surgery

## 2021-12-23 ENCOUNTER — Encounter: Payer: Self-pay | Admitting: Surgery

## 2021-12-23 ENCOUNTER — Other Ambulatory Visit: Payer: Self-pay | Admitting: Surgery

## 2021-12-23 VITALS — BP 118/82 | HR 89 | Temp 98.8°F | Wt 207.0 lb

## 2021-12-23 DIAGNOSIS — D171 Benign lipomatous neoplasm of skin and subcutaneous tissue of trunk: Secondary | ICD-10-CM | POA: Diagnosis not present

## 2021-12-23 NOTE — Progress Notes (Signed)
  Procedure Date:  12/23/2021  Pre-operative Diagnosis:  Back lipoma  Post-operative Diagnosis:  Back lipoma, 5 x 3 cm.  Procedure:  Excision of back lipoma  Surgeon:  Melvyn Neth, MD  Anesthesia:  15 ml of 1% lidocaine with epi  Estimated Blood Loss:  10 ml  Specimens:  back lipoma  Complications:  None  Indications for Procedure:  This is a 36 y.o. female with diagnosis of a symptomatic back lipoma.  The patient wishes to have this excised. The risks of bleeding, abscess or infection, injury to surrounding structures, and need for further procedures were all discussed with the patient and she was willing to proceed.  Description of Procedure: The patient was correctly identified at bedside.  The patient was placed supine.  Appropriate time-outs were performed.  The patient's mid back was prepped and draped in usual sterile fashion.  Local anesthetic was infused intradermally.  A 5 cm incision was made over the lipoma, and scalpel was used to dissect down the skin and subcutaneous tissue.  Skin flaps were created sharply, and then the lipoma was excised intact.  It was sent off to pathology.  The cavity was then irrigated and hemostasis was assured with manual pressure and a 3-0 Vicryl suture.  The wound was then closed in three layers using 3-0 Vicryl and 4-0 Monocryl.  The incision was cleaned and sealed with DermaBond.  The patient tolerated the procedure well and all sharps were appropriately disposed of at the end of the case.  --Patient may shower tomorrow --May take Tylenol and Ibuprofen for pain control. --Activity restrictions reviewed. --Follow up next week.   Melvyn Neth, MD

## 2021-12-23 NOTE — Patient Instructions (Signed)
If you have any concerns or questions, please feel free to call our office. See follow up appointment below.  Lipoma Removal, Care After The following information offers guidance on how to care for yourself after your procedure. Your health care provider may also give you more specific instructions. If you have problems or questions, contact your health care provider. What can I expect after the procedure? After the procedure, it is common to have: Mild pain. Swelling. Bruising. Follow these instructions at home: Bathing  Do not take baths, swim, or use a hot tub until your health care provider approves. Ask your health care provider if you may take showers. You may only be allowed to take sponge baths. Keep your bandage (dressing) clean and dry until your health care provider says it can be removed. Incision care  Follow instructions from your health care provider about how to take care of your incision. Make sure you: Wash your hands with soap and water for at least 20 seconds before and after you change your dressing. If soap and water are not available, use hand sanitizer. Change your dressing as told by your health care provider. Leave stitches (sutures), skin glue, or adhesive strips in place. These skin closures may need to stay in place for 2 weeks or longer. If adhesive strip edges start to loosen and curl up, you may trim the loose edges. Do not remove adhesive strips completely unless your health care provider tells you to do that. Check your incision area every day for signs of infection. Check for: More redness, swelling, or pain. Fluid or blood. Warmth. Pus or a bad smell. Medicines Take over-the-counter and prescription medicines only as told by your health care provider. If you were prescribed an antibiotic medicine, use it as told by your health care provider. Do not stop using the antibiotic even if you start to feel better. General instructions  If you were given a  sedative during the procedure, it can affect you for several hours. Do not drive or operate machinery until your health care provider says that it is safe. Do not use any products that contain nicotine or tobacco before the procedure. These products include cigarettes, chewing tobacco, and vaping devices, such as e-cigarettes. These can delay healing after surgery. If you need help quitting, ask your health care provider. Return to your normal activities as told by your health care provider. Ask your health care provider what activities are safe for you. Keep all follow-up visits. This is important. Contact a health care provider if: You have more redness, swelling, or pain around your incision. You have fluid or blood coming from your incision. Your incision feels warm to the touch. You have pus or a bad smell coming from your incision. You have pain that does not get better with medicine. Get help right away if: You have chills or a fever. You have severe pain. Summary After the procedure, it is common to have mild pain, swelling, and bruising. Follow instructions from your health care provider about how to take care of your incision. Contact a health care provider if you have signs of infection such as more redness, swelling, or pain. This information is not intended to replace advice given to you by your health care provider. Make sure you discuss any questions you have with your health care provider. Document Revised: 04/19/2021 Document Reviewed: 04/19/2021 Elsevier Patient Education  2023 Elsevier Inc.  

## 2021-12-24 ENCOUNTER — Other Ambulatory Visit: Payer: Self-pay | Admitting: Physician Assistant

## 2021-12-24 ENCOUNTER — Telehealth: Payer: Self-pay

## 2021-12-24 MED ORDER — HYDROCODONE-ACETAMINOPHEN 5-325 MG PO TABS: 1.0000 | ORAL_TABLET | Freq: Four times a day (QID) | ORAL | 0 refills | Status: DC | PRN

## 2021-12-24 NOTE — Telephone Encounter (Signed)
Patient came in at [facility] yesterday and Dr. Hampton Abbot excised a lipoma on pt's back. Pt called stating she is in excruciating pain and had to leave work due to it. Dr. Hampton Abbot told the pt yesterday if she needed narcotics, he would send some in for her. She has been taking Ibuprofen with no relief. Her pain is an 8, no N,V,F,C, no drainage. The pain is radiating to her shoulder.    Per Thedore Mins, he will send her in an Rx for pain due to Dr. Hampton Abbot being out to the office today. Pt notified. Verbalizes understanding.

## 2022-01-03 ENCOUNTER — Ambulatory Visit (INDEPENDENT_AMBULATORY_CARE_PROVIDER_SITE_OTHER): Payer: BC Managed Care – PPO | Admitting: Surgery

## 2022-01-03 ENCOUNTER — Other Ambulatory Visit: Payer: Self-pay

## 2022-01-03 ENCOUNTER — Encounter: Payer: Self-pay | Admitting: Surgery

## 2022-01-03 VITALS — BP 121/89 | HR 81 | Temp 98.1°F | Ht 61.0 in | Wt 210.0 lb

## 2022-01-03 DIAGNOSIS — D171 Benign lipomatous neoplasm of skin and subcutaneous tissue of trunk: Secondary | ICD-10-CM

## 2022-01-03 DIAGNOSIS — Z09 Encounter for follow-up examination after completed treatment for conditions other than malignant neoplasm: Secondary | ICD-10-CM

## 2022-01-03 NOTE — Patient Instructions (Signed)
Please call the office with any questions or concerns.

## 2022-01-03 NOTE — Progress Notes (Signed)
01/03/2022  HPI: Sherri Robertson is a 36 y.o. female s/p excision of upper back lipoma on 12/23/2021.  Patient presents today for follow-up.  She reports that she is doing well now and denies any significant pain issues.  No issues with the incision has been healing well.  Vital signs: BP 121/89   Pulse 81   Temp 98.1 F (36.7 C) (Oral)   Ht '5\' 1"'$  (1.549 m)   Wt 210 lb (95.3 kg)   SpO2 98%   BMI 39.68 kg/m    Physical Exam: Constitutional: No acute distress Skin: Upper back incision is healing well with no evidence of infection.  Some Dermabond still in place.  Assessment/Plan: This is a 36 y.o. female s/p excision of upper back lipoma.  - Discussed pathology results with the patient showing a lipoma.  No evidence of malignancy. - Patient's wound is healing well without any complications. - Follow-up as needed.   Melvyn Neth, [facility]

## 2022-01-06 ENCOUNTER — Ambulatory Visit: Payer: BC Managed Care – PPO | Admitting: Family Medicine

## 2022-04-10 ENCOUNTER — Encounter: Payer: Self-pay | Admitting: Hematology and Oncology

## 2022-07-21 LAB — HEMOGLOBIN A1C: Hemoglobin A1C: 5.3

## 2022-07-24 ENCOUNTER — Other Ambulatory Visit: Payer: Self-pay | Admitting: Family Medicine

## 2022-07-24 DIAGNOSIS — M5441 Lumbago with sciatica, right side: Secondary | ICD-10-CM

## 2022-07-24 DIAGNOSIS — M542 Cervicalgia: Secondary | ICD-10-CM

## 2022-08-27 ENCOUNTER — Other Ambulatory Visit: Payer: Self-pay | Admitting: Family Medicine

## 2022-08-27 DIAGNOSIS — M542 Cervicalgia: Secondary | ICD-10-CM

## 2022-08-27 DIAGNOSIS — M5441 Lumbago with sciatica, right side: Secondary | ICD-10-CM

## 2022-08-28 NOTE — Telephone Encounter (Signed)
Requested medication (s) are due for refill today: yes  Requested medication (s) are on the active medication list: yes  Last refill:  07/25/22  Future visit scheduled: no  Notes to clinic:  Unable to refill per protocol, cannot delegate.      Requested Prescriptions  Pending Prescriptions Disp Refills   cyclobenzaprine (FLEXERIL) 5 MG tablet [Pharmacy Med Name: CYCLOBENZAPRINE 5 MG TABLET] 30 tablet 0    Sig: TAKE 1 TABLET (5 MG TOTAL) BY MOUTH AT BEDTIME. TAKE AS NEEDED     Not Delegated - Analgesics:  Muscle Relaxants Failed - 08/27/2022  6:11 PM      Failed - This refill cannot be delegated      Failed - Valid encounter within last 6 months    Recent Outpatient Visits           10 months ago Neck pain   [facility] Danelle Berry, PA-C   2 years ago Migraine without aura and without status migrainosus, not intractable   [facility] Caro Laroche, DO   2 years ago COVID   [facility] Danelle Berry, PA-C   2 years ago Right upper quadrant abdominal pain   [facility] Welford Roche D, MD   2 years ago Migraine without aura and without status migrainosus, not intractable   [facility] Danelle Berry, [place]

## 2022-09-01 ENCOUNTER — Encounter: Payer: Self-pay | Admitting: Family Medicine

## 2022-09-01 ENCOUNTER — Ambulatory Visit (INDEPENDENT_AMBULATORY_CARE_PROVIDER_SITE_OTHER): Payer: BC Managed Care – PPO | Admitting: Family Medicine

## 2022-09-01 VITALS — BP 118/72 | HR 93 | Temp 98.2°F | Resp 16 | Ht 61.0 in | Wt 218.5 lb

## 2022-09-01 DIAGNOSIS — Z7985 Long-term (current) use of injectable non-insulin antidiabetic drugs: Secondary | ICD-10-CM | POA: Diagnosis not present

## 2022-09-01 DIAGNOSIS — M542 Cervicalgia: Secondary | ICD-10-CM

## 2022-09-01 DIAGNOSIS — E119 Type 2 diabetes mellitus without complications: Secondary | ICD-10-CM | POA: Diagnosis not present

## 2022-09-01 MED ORDER — TIRZEPATIDE 7.5 MG/0.5ML ~~LOC~~ SOAJ
7.5000 mg | SUBCUTANEOUS | 0 refills | Status: DC
Start: 2022-09-01 — End: 2022-09-12

## 2022-09-01 NOTE — Progress Notes (Signed)
[name], female    DOB: 18-Nov-1985, 37 y.o.   MRN: [id number]  PCP: Danelle Berry, PA-C  Chief Complaint  Patient presents with   Weight Loss    Pt would like to start mounjaro, already contacted insurance    Subjective:   Sherri Robertson is a 37 y.o. female, presents to clinic with CC of the following:  HPI   Hx of type 2 dm D2TM - dx in at last 2017 Metformin - see one rx for it in the past, pt has tried it multiple times, it causes severe GI upset/diarrhea tried for up to a month, tried different times with once a day dosing she never tolerated it   Was seeing Bariatric surgery [facility] [place] last OV and labs 07/2022   Weight management Boxing exercising 3 x a week, walking daily  Diet focused on protein in diet 60-80 g with small meals and pushing 64 oz water She was just told by bariatric specialists that she needs to do her med management from PCP not them and they will f/up with her every 1 year then 3, year, 5 years .Marland Kitchen. 10 y  Ran out of wegovy rx in the last 2 months  Wt Readings from Last 5 Encounters:  09/01/22 218 lb 8 oz (99.1 kg)  01/03/22 210 lb (95.3 kg)  12/23/21 207 lb (93.9 kg)  11/25/21 212 lb (96.2 kg)  10/04/21 220 lb 6.4 oz (100 kg)   BMI Readings from Last 5 Encounters:  09/01/22 41.29 kg/m  01/03/22 39.68 kg/m  12/23/21 39.11 kg/m  11/25/21 40.06 kg/m  10/04/21 41.64 kg/m   Iron deficiency - w/o anemia Last A1C 5.3 April 11/2022 Vit d low 13.2 B12 757 TSH 1.245 Cbc and cmp fairly unremarkable     Lab Results  Component Value Date   HGBA1C 6.2 (H) 07/14/2019    [facility] outpt pharmacy for meds   Needs refill on muscle relaxers as well    Patient Active Problem List   Diagnosis Date Noted   Anemia 05/31/2021   Iron deficiency 05/31/2021   COVID-19 10/13/2019   Migraine without aura and without status migrainosus, not intractable 12/01/2018    Gastroesophageal reflux disease 12/01/2018   Prediabetes 02/05/2017   Lipoma of back 02/05/2017   Thyroid nodule 01/22/2017   Vitamin D deficiency 01/22/2017   Acanthosis nigricans 08/05/2015   Anxiety and depression 01/26/2015   Dysmenorrhea 01/26/2015   Fibroid 01/26/2015   Anemia, iron deficiency 09/21/2009   Obesity, morbid, BMI 50 or higher (HCC) 07/27/2009      Current Outpatient Medications:    clobetasol ointment (TEMOVATE) 0.05 %, APPLY TO AFFECTED AREAS ON HANDS TWICE DAILY UNTIL CLEAR, THEN AS NEEDED, Disp: 30 g, Rfl: 1   cyclobenzaprine (FLEXERIL) 5 MG tablet, TAKE 1 TABLET (5 MG TOTAL) BY MOUTH AT BEDTIME. TAKE AS NEEDED, Disp: 30 tablet, Rfl: 0   Iron-Folic Acid-C-B6-B12-Zinc 150-1.25 MG TABS, Take 1 tablet by mouth at bedtime., Disp: 90 tablet, Rfl: 1   SUMAtriptan (TOSYMRA) 10 MG/ACT SOLN, Place 10 mg into the nose as needed (May repeat after 1 hour.  Maximum 2 sprays/24 hours.). (Patient not taking: Reported on 09/01/2022), Disp: 6 each, Rfl: 5   WEGOVY 2.4 MG/0.75ML SOAJ, Inject into the skin. (Patient not taking: Reported on 09/01/2022), Disp: , Rfl:    No Known Allergies   Social History   Tobacco Use   Smoking status: Never  Passive exposure: Never   Smokeless tobacco: Never  Vaping Use   Vaping Use: Never used  Substance Use Topics   Alcohol use: Yes    Comment: occassional - 3 glasses wine/month   Drug use: No      Chart Review Today: I personally reviewed active problem list, medication list, allergies, family history, social history, health maintenance, notes from last encounter, lab results, imaging with the patient/caregiver today.   Review of Systems  Constitutional: Negative.   HENT: Negative.    Eyes: Negative.   Respiratory: Negative.    Cardiovascular: Negative.   Gastrointestinal: Negative.   Endocrine: Negative.   Genitourinary: Negative.   Musculoskeletal: Negative.   Skin: Negative.   Allergic/Immunologic: Negative.    Neurological: Negative.   Hematological: Negative.   Psychiatric/Behavioral: Negative.    All other systems reviewed and are negative.      Objective:   Vitals:   09/01/22 0901  BP: 118/72  Pulse: 93  Resp: 16  Temp: 98.2 F (36.8 C)  TempSrc: Oral  SpO2: 99%  Weight: 218 lb 8 oz (99.1 kg)  Height: 5\' 1"  (1.549 m)    Body mass index is 41.29 kg/m.  Physical Exam Vitals and nursing note reviewed.  Constitutional:      Appearance: She is well-developed.  HENT:     Head: Normocephalic and atraumatic.     Nose: Nose normal.  Eyes:     General:        Right eye: No discharge.        Left eye: No discharge.     Conjunctiva/sclera: Conjunctivae normal.  Neck:     Trachea: No tracheal deviation.  Cardiovascular:     Rate and Rhythm: Normal rate and regular rhythm.  Pulmonary:     Effort: Pulmonary effort is normal. No respiratory distress.     Breath sounds: No stridor.  Musculoskeletal:        General: Normal range of motion.  Skin:    General: Skin is warm and dry.     Findings: No rash.  Neurological:     Mental Status: She is alert.     Motor: No abnormal muscle tone.     Coordination: Coordination normal.  Psychiatric:        Behavior: Behavior normal.      Results for orders placed or performed during the hospital encounter of 10/04/20  Culture, group A strep   Specimen: Throat  Result Value Ref Range   Specimen Description      THROAT Performed at [facility], [address], [place], [place] [address]    Special Requests      NONE Performed at [facility], [address] [place], [place] [address]    Culture      NO GROUP A STREP (S.PYOGENES) ISOLATED Performed at [facility], [address], [place], [place] [address]    Report Status 10/07/2020 FINAL   POCT rapid strep A  Result Value Ref Range   Streptococcus, Group A Screen (Direct) NEGATIVE NEGATIVE       Assessment & Plan:   PT presents for  f/up and med refills, she is out of meds that bariatric surgery was managing and refilling and she will no longer need to follow-up with them.  She would like to try alternate medication for her diabetes since she is having trouble getting Wegovy  1. Type 2 diabetes mellitus without complication, without long-term current use of insulin (HCC)  Wegovy/Ozempic is unavailable, dose comparison we will try to see if mounjaro 7.5 dose is available and then f/up and see if meds need to be titrated up She does have well-controlled type 2 diabetes her last A1c was done at [facility] and has been abstracted Kidney function was also done will need to be abstracted by staff and next office visit we will get urine testing for urine microalbumin creatinine ratio - tirzepatide (MOUNJARO) 7.5 MG/0.5ML Pen; Inject 7.5 mg into the skin once a week.  Dispense: 2 mL; Refill: 0 - Hemoglobin A1c  2. Neck pain Chronic and episodic - refill on musclerelaxer - cyclobenzaprine (FLEXERIL) 5 MG tablet; Take 1 tablet (5 mg total) by mouth at bedtime. Take as needed  Dispense: 90 tablet; Refill: 3       Danelle Berry, PA-C 09/01/22 9:19 AM

## 2022-09-12 ENCOUNTER — Other Ambulatory Visit: Payer: Self-pay | Admitting: Family Medicine

## 2022-09-12 ENCOUNTER — Encounter: Payer: Self-pay | Admitting: Family Medicine

## 2022-09-12 DIAGNOSIS — E119 Type 2 diabetes mellitus without complications: Secondary | ICD-10-CM

## 2022-09-12 MED ORDER — CYCLOBENZAPRINE HCL 5 MG PO TABS
5.0000 mg | ORAL_TABLET | Freq: Every day | ORAL | 3 refills | Status: DC
Start: 2022-09-12 — End: 2023-11-24

## 2022-09-13 ENCOUNTER — Encounter: Payer: Self-pay | Admitting: Family Medicine

## 2022-09-15 MED ORDER — TIRZEPATIDE 7.5 MG/0.5ML ~~LOC~~ SOAJ
7.5000 mg | SUBCUTANEOUS | 0 refills | Status: DC
Start: 2022-09-15 — End: 2022-10-30

## 2022-10-30 ENCOUNTER — Telehealth: Payer: BC Managed Care – PPO | Admitting: Family Medicine

## 2022-10-30 DIAGNOSIS — Z7985 Long-term (current) use of injectable non-insulin antidiabetic drugs: Secondary | ICD-10-CM | POA: Diagnosis not present

## 2022-10-30 DIAGNOSIS — E119 Type 2 diabetes mellitus without complications: Secondary | ICD-10-CM | POA: Diagnosis not present

## 2022-10-30 MED ORDER — TIRZEPATIDE 10 MG/0.5ML ~~LOC~~ SOAJ
10.0000 mg | SUBCUTANEOUS | 0 refills | Status: DC
Start: 2022-10-30 — End: 2023-01-26

## 2022-10-30 NOTE — Progress Notes (Signed)
Name: Sherri Robertson   MRN: [id number]    DOB: May 12, 1985   Date:10/30/2022       Progress Note  Subjective:    I connected with  Sherri Robertson  on 10/30/22 at 11:00 AM EDT by a video enabled telemedicine application and verified that I am speaking with the correct person using two identifiers.  I discussed the limitations of evaluation and management by telemedicine and the availability of in person appointments. The patient expressed understanding and agreed to proceed. Staff also discussed with the patient that there may be a patient responsible charge related to this service. Patient Location: work office - Midwife Location: [facility]  Additional Individuals present: none  Chief Complaint  Patient presents with   Follow-up   Diabetes    Mounjaro doing well, would like to increase dose    Sherri Robertson is a 37 y.o. female, presents for virtual visit for routine follow up on the conditions listed above.  Lab Results  Component Value Date   HGBA1C 5.3 07/21/2022  T2DM and obesity started mounjaro and is on the 7.5 mg dose, wants to increase to 10 mg  Weight loss plateau  Wt Readings from Last 5 Encounters:  09/01/22 218 lb 8 oz (99.1 kg)  01/03/22 210 lb (95.3 kg)  12/23/21 207 lb (93.9 kg)  11/25/21 212 lb (96.2 kg)  10/04/21 220 lb 6.4 oz (100 kg)   BMI Readings from Last 5 Encounters:  09/01/22 41.29 kg/m  01/03/22 39.68 kg/m  12/23/21 39.11 kg/m  11/25/21 40.06 kg/m  10/04/21 41.64 kg/m   60-80 g a protein a day is her focus, meds help a lot with cravings and she's focusing on healthy foods, she does snacks during the day with protein. Main lunch focus on the protein at dinner usually with greens Walks and does a boxing class     Patient Active Problem List   Diagnosis Date Noted   Anemia 05/31/2021   Iron deficiency 05/31/2021   COVID-19 10/13/2019   Migraine without aura and without status migrainosus, not intractable 12/01/2018    Gastroesophageal reflux disease 12/01/2018   Prediabetes 02/05/2017   Lipoma of back 02/05/2017   Thyroid nodule 01/22/2017   Vitamin D deficiency 01/22/2017   Acanthosis nigricans 08/05/2015   Anxiety and depression 01/26/2015   Dysmenorrhea 01/26/2015   Fibroid 01/26/2015   Anemia, iron deficiency 09/21/2009    Current Outpatient Medications:    cyclobenzaprine (FLEXERIL) 5 MG tablet, Take 1 tablet (5 mg total) by mouth at bedtime. Take as needed, Disp: 90 tablet, Rfl: 3   Iron-Folic Acid-C-B6-B12-Zinc 150-1.25 MG TABS, Take 1 tablet by mouth at bedtime., Disp: 90 tablet, Rfl: 1   SUMAtriptan (TOSYMRA) 10 MG/ACT SOLN, Place 10 mg into the nose as needed (May repeat after 1 hour.  Maximum 2 sprays/24 hours.)., Disp: 6 each, Rfl: 5   tirzepatide (MOUNJARO) 7.5 MG/0.5ML Pen, Inject 7.5 mg into the skin once a week., Disp: 6 mL, Rfl: 0   clobetasol ointment (TEMOVATE) 0.05 %, APPLY TO AFFECTED AREAS ON HANDS TWICE DAILY UNTIL CLEAR, THEN AS NEEDED (Patient not taking: Reported on 10/30/2022), Disp: 30 g, Rfl: 1 Allergies  Allergen Reactions   Metformin And Related Diarrhea    Stomach upset/diarrhea, tried it 2x with same side effect    Past Surgical History:  Procedure Laterality Date   CESAREAN SECTION     LAPAROSCOPIC GASTRIC SLEEVE RESECTION  2022   TONSILLECTOMY     Family  History  Problem Relation Age of Onset   Hypertension Mother    Depression Mother    Hyperlipidemia Mother    Other Father        unknown medical history   Depression Sister    Anxiety disorder Sister    Anxiety disorder Brother    Eczema Daughter    COPD Maternal Grandmother    Emphysema Maternal Grandmother    Cancer Maternal Grandfather        stomach cancer   Social History   Socioeconomic History   Marital status: Single    Spouse name: Not on file   Number of children: 1   Years of education: Not on file   Highest education level: Some college, no degree  Occupational History    Occupation: Clinical support Theme park manager: [employer]  Tobacco Use   Smoking status: Never    Passive exposure: Never   Smokeless tobacco: Never  Vaping Use   Vaping status: Never Used  Substance and Sexual Activity   Alcohol use: Yes    Comment: occassional - 3 glasses wine/month   Drug use: No   Sexual activity: Yes    Birth control/protection: None  Other Topics Concern   Not on file  Social History Narrative   Patient is right-handed. She lives in a two level home. She drinks coffee and tea 2-4 x a week. She does not exercise.   Social Determinants of Health   Financial Resource Strain: Not on file  Food Insecurity: Not on file  Transportation Needs: Not on file  Physical Activity: Not on file  Stress: Not on file  Social Connections: Not on file  Intimate Partner Violence: Not on file    Chart Review Today: I personally reviewed active problem list, medication list, allergies, family history, social history, health maintenance, notes from last encounter, lab results, imaging with the patient/caregiver today.   Review of Systems  Constitutional: Negative.   HENT: Negative.    Eyes: Negative.   Respiratory: Negative.    Cardiovascular: Negative.   Gastrointestinal: Negative.   Endocrine: Negative.   Genitourinary: Negative.   Musculoskeletal: Negative.   Skin: Negative.   Allergic/Immunologic: Negative.   Neurological: Negative.   Hematological: Negative.   Psychiatric/Behavioral: Negative.    All other systems reviewed and are negative.     Objective:    Virtual encounter, vitals limited, only able to obtain the following There were no vitals filed for this visit. There is no height or weight on file to calculate BMI. Nursing Note and Vital Signs reviewed.  Physical Exam Vitals and nursing note reviewed.  Constitutional:      Appearance: Normal appearance.  Neurological:     Mental Status: She is alert.  Psychiatric:        Mood and  Affect: Mood normal.     PE limited by telephone encounter  No results found for this or any previous visit (from the past 72 hour(s)).  PHQ2/9:    10/30/2022    9:21 AM 09/01/2022    9:01 AM 10/04/2021    9:48 AM 06/05/2020    8:42 AM 04/24/2020   11:34 AM  Depression screen PHQ 2/9  Decreased Interest 0 0 1 0 0  Down, Depressed, Hopeless 0 1 1 0 0  PHQ - 2 Score 0 1 2 0 0  Altered sleeping 0 0 1    Tired, decreased energy 0 0 1    Change in appetite 0 0 0  Feeling bad or failure about yourself  0 0 0    Trouble concentrating 0 0 0    Moving slowly or fidgety/restless 0 0 0    Suicidal thoughts 0 0 0    PHQ-9 Score 0 1 4    Difficult doing work/chores Not difficult at all Somewhat difficult Somewhat difficult     PHQ-2/9 Result is reviewed and negative  Fall Risk:    10/30/2022    9:21 AM 09/01/2022    9:00 AM 01/03/2022    9:13 AM 10/04/2021    9:48 AM 06/05/2020    8:42 AM  Fall Risk   Falls in the past year? 0 0 0 0 0  Number falls in past yr: 0 0  0 0  Injury with Fall? 0 0  0 0  Risk for fall due to : No Fall Risks No Fall Risks  No Fall Risks   Follow up Falls prevention discussed;Education provided;Falls evaluation completed Falls prevention discussed;Education provided;Falls evaluation completed  Education provided;Falls prevention discussed      Assessment and Plan:     ICD-10-CM   1. Type 2 diabetes mellitus without complication, without long-term current use of insulin (HCC)  E11.9 tirzepatide (MOUNJARO) 10 MG/0.5ML Pen   last A1C was well controlled, she req mounjaro dose increase, needs in office appt in 2-3 months to do DM HM gaps and labs      I discussed the assessment and treatment plan with the patient. The patient was provided an opportunity to ask questions and all were answered. The patient agreed with the plan and demonstrated an understanding of the instructions.  The patient was advised to call back or seek an in-person evaluation if the  symptoms worsen or if the condition fails to improve as anticipated.  I provided 15  minutes of non-face-to-face time during this encounter.  Danelle Berry, PA-C 10/30/22 11:22 AM

## 2023-01-26 ENCOUNTER — Ambulatory Visit (INDEPENDENT_AMBULATORY_CARE_PROVIDER_SITE_OTHER): Payer: BC Managed Care – PPO | Admitting: Physician Assistant

## 2023-01-26 ENCOUNTER — Telehealth: Payer: Self-pay | Admitting: Family Medicine

## 2023-01-26 ENCOUNTER — Encounter: Payer: Self-pay | Admitting: Hematology and Oncology

## 2023-01-26 ENCOUNTER — Encounter: Payer: Self-pay | Admitting: Physician Assistant

## 2023-01-26 VITALS — BP 110/80 | HR 84 | Temp 97.4°F | Resp 16 | Ht 61.0 in | Wt 206.2 lb

## 2023-01-26 DIAGNOSIS — L309 Dermatitis, unspecified: Secondary | ICD-10-CM

## 2023-01-26 DIAGNOSIS — E559 Vitamin D deficiency, unspecified: Secondary | ICD-10-CM | POA: Diagnosis not present

## 2023-01-26 DIAGNOSIS — Z7985 Long-term (current) use of injectable non-insulin antidiabetic drugs: Secondary | ICD-10-CM

## 2023-01-26 DIAGNOSIS — E119 Type 2 diabetes mellitus without complications: Secondary | ICD-10-CM | POA: Diagnosis not present

## 2023-01-26 DIAGNOSIS — Z903 Acquired absence of stomach [part of]: Secondary | ICD-10-CM

## 2023-01-26 DIAGNOSIS — D509 Iron deficiency anemia, unspecified: Secondary | ICD-10-CM

## 2023-01-26 MED ORDER — TIRZEPATIDE 10 MG/0.5ML ~~LOC~~ SOAJ: 10.0000 mg | SUBCUTANEOUS | 0 refills | Status: DC

## 2023-01-26 MED ORDER — CLOBETASOL PROPIONATE 0.05 % EX OINT: TOPICAL_OINTMENT | CUTANEOUS | 1 refills | Status: AC

## 2023-01-26 NOTE — Assessment & Plan Note (Signed)
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

## 2023-01-26 NOTE — Assessment & Plan Note (Signed)
Chronic, historic condition, ongoing Most recent A1c was 5.3, recheck today She is currently taking Mounjaro 10 mg weekly injection and appears to be tolerating well, refills provided today She reports following a high-protein diet with reduced carbohydrates.  She is also exercising several times per week with a mix of cardio and strength training. Will request eye exam records from [facility] in [place] Foot exam completed today Continue current regimen, follow-up in 3 months or sooner if concerns arise

## 2023-01-26 NOTE — Telephone Encounter (Signed)
Patient said she need a prior auth for tirzepatide [facility]) 10 MG/0.5ML Pen as for insurance the PO goes month to month. Please f/u with [employer].

## 2023-01-26 NOTE — Progress Notes (Signed)
Established Patient Office Visit  Name: Sherri Robertson   MRN: [id number]    DOB: [birth year]   Date:01/26/2023  Today's Provider: Jacquelin Hawking, MHS, PA-C Introduced myself to the patient as a PA-C and provided education on APPs in clinical practice.         Subjective  Chief Complaint  Chief Complaint  Patient presents with   Diabetes    HPI   Diabetes, Type 2 - Last A1c 5.3 - Medications: Mounjaro 10 mg weekly injection - her last dose was last Monday  - Compliance: Good  - Checking BG at home: Not checking at home - Diet: She has had gastric bypass surgery- she is trying follow high protein diet and reduced carbs. She is trying to eat every three hours throughout the day  - Exercise: She is walking a mile everyday and she is doing cardio boxing class 2x per week. She is also starting to do strength training  - Eye exam: She goes to [facility] in [place]  - Foot exam: completed today  - Microalbumin: ordered today  - Statin: not on statin  - PNA vaccine: NA  - Denies symptoms of hypoglycemia, polyuria, polydipsia, numbness extremities, foot ulcers/trauma  She reports her eczema is flaring on her hands   Patient Active Problem List   Diagnosis Date Noted   Type 2 diabetes mellitus without complication, without long-term current use of insulin (HCC) 01/26/2023   S/P gastric sleeve procedure 01/26/2023   Eczema 01/26/2023   Anemia 05/31/2021   Iron deficiency 05/31/2021   COVID-19 10/13/2019   Migraine without aura and without status migrainosus, not intractable 12/01/2018   Gastroesophageal reflux disease 12/01/2018   Lipoma of back 02/05/2017   Thyroid nodule 01/22/2017   Vitamin D deficiency 01/22/2017   Acanthosis nigricans 08/05/2015   Anxiety and depression 01/26/2015   Dysmenorrhea 01/26/2015   Fibroid 01/26/2015   Anemia, iron deficiency 09/21/2009    Past Surgical History:  Procedure Laterality Date   CESAREAN SECTION     LAPAROSCOPIC  GASTRIC SLEEVE RESECTION  2022   TONSILLECTOMY      Family History  Problem Relation Age of Onset   Hypertension Mother    Depression Mother    Hyperlipidemia Mother    Other Father        unknown medical history   Depression Sister    Anxiety disorder Sister    Anxiety disorder Brother    Eczema Daughter    COPD Maternal Grandmother    Emphysema Maternal Grandmother    Cancer Maternal Grandfather        stomach cancer    Social History   Tobacco Use   Smoking status: Never    Passive exposure: Never   Smokeless tobacco: Never  Substance Use Topics   Alcohol use: Yes    Comment: occassional - 3 glasses wine/month     Current Outpatient Medications:    cyclobenzaprine (FLEXERIL) 5 MG tablet, Take 1 tablet (5 mg total) by mouth at bedtime. Take as needed, Disp: 90 tablet, Rfl: 3   Iron-Folic Acid-C-B6-B12-Zinc 150-1.25 MG TABS, Take 1 tablet by mouth at bedtime., Disp: 90 tablet, Rfl: 1   clobetasol ointment (TEMOVATE) 0.05 %, APPLY TO AFFECTED AREAS ON HANDS TWICE DAILY UNTIL CLEAR, THEN AS NEEDED, Disp: 30 g, Rfl: 1   SUMAtriptan (TOSYMRA) 10 MG/ACT SOLN, Place 10 mg into the nose as needed (May repeat after 1 hour.  Maximum 2 sprays/24  hours.). (Patient not taking: Reported on 01/26/2023), Disp: 6 each, Rfl: 5   tirzepatide (MOUNJARO) 10 MG/0.5ML Pen, Inject 10 mg into the skin once a week., Disp: 6 mL, Rfl: 0  Allergies  Allergen Reactions   Metformin And Related Diarrhea    Stomach upset/diarrhea, tried it 2x with same side effect    I personally reviewed active problem list, medication list, allergies, health maintenance, notes from last encounter, lab results with the patient/caregiver today.   Review of Systems  Cardiovascular:  Negative for chest pain, palpitations and leg swelling.  Gastrointestinal:  Negative for constipation, diarrhea, nausea and vomiting.  Genitourinary:  Negative for frequency.  Endo/Heme/Allergies:  Negative for polydipsia.       Objective  Vitals:   01/26/23 0935  BP: 110/80  Pulse: 84  Resp: 16  Temp: (!) 97.4 F (36.3 C)  TempSrc: Oral  SpO2: 99%  Weight: 206 lb 3.2 oz (93.5 kg)  Height: 5\' 1"  (1.549 m)    Body mass index is 38.96 kg/m.  Physical Exam Vitals reviewed.  Constitutional:      General: She is awake.     Appearance: Normal appearance. She is well-developed and well-groomed.  HENT:     Head: Normocephalic and atraumatic.  Cardiovascular:     Rate and Rhythm: Normal rate and regular rhythm.     Pulses: Normal pulses.          Radial pulses are 2+ on the right side and 2+ on the left side.       Dorsalis pedis pulses are 2+ on the right side and 2+ on the left side.     Heart sounds: Normal heart sounds. No murmur heard.    No friction rub. No gallop.  Pulmonary:     Effort: Pulmonary effort is normal.     Breath sounds: Normal breath sounds. No decreased air movement. No decreased breath sounds, wheezing, rhonchi or rales.  Musculoskeletal:     Right lower leg: No edema.     Left lower leg: No edema.  Neurological:     General: No focal deficit present.     Mental Status: She is alert and oriented to person, place, and time.     GCS: GCS eye subscore is 4. GCS verbal subscore is 5. GCS motor subscore is 6.     Cranial Nerves: No cranial nerve deficit, dysarthria or facial asymmetry.  Psychiatric:        Behavior: Behavior is cooperative.      No results found for this or any previous visit (from the past 2160 hour(s)).   PHQ2/9:    01/26/2023    9:26 AM 10/30/2022    9:21 AM 09/01/2022    9:01 AM 10/04/2021    9:48 AM 06/05/2020    8:42 AM  Depression screen PHQ 2/9  Decreased Interest 0 0 0 1 0  Down, Depressed, Hopeless 0 0 1 1 0  PHQ - 2 Score 0 0 1 2 0  Altered sleeping 0 0 0 1   Tired, decreased energy 0 0 0 1   Change in appetite 0 0 0 0   Feeling bad or failure about yourself  0 0 0 0   Trouble concentrating 0 0 0 0   Moving slowly or  fidgety/restless 0 0 0 0   Suicidal thoughts 0 0 0 0   PHQ-9 Score 0 0 1 4   Difficult doing work/chores Not difficult at all Not difficult at all Somewhat difficult  Somewhat difficult       Fall Risk:    01/26/2023    9:26 AM 10/30/2022    9:21 AM 09/01/2022    9:00 AM 01/03/2022    9:13 AM 10/04/2021    9:48 AM  Fall Risk   Falls in the past year? 0 0 0 0 0  Number falls in past yr: 0 0 0  0  Injury with Fall? 0 0 0  0  Risk for fall due to : No Fall Risks No Fall Risks No Fall Risks  No Fall Risks  Follow up Falls prevention discussed;Education provided;Falls evaluation completed Falls prevention discussed;Education provided;Falls evaluation completed Falls prevention discussed;Education provided;Falls evaluation completed  Education provided;Falls prevention discussed      Functional Status Survey: Is the patient deaf or have difficulty hearing?: No Does the patient have difficulty seeing, even when wearing glasses/contacts?: No Does the patient have difficulty concentrating, remembering, or making decisions?: No Does the patient have difficulty walking or climbing stairs?: No Does the patient have difficulty dressing or bathing?: No Does the patient have difficulty doing errands alone such as visiting a doctor's office or shopping?: No    Assessment & Plan  Problem List Items Addressed This Visit       Endocrine   Type 2 diabetes mellitus without complication, without long-term current use of insulin (HCC) - Primary    Chronic, historic condition, ongoing Most recent A1c was 5.3, recheck today She is currently taking Mounjaro 10 mg weekly injection and appears to be tolerating well, refills provided today She reports following a high-protein diet with reduced carbohydrates.  She is also exercising several times per week with a mix of cardio and strength training. Will request eye exam records from [facility] in [place] Foot exam completed today Continue current  regimen, follow-up in 3 months or sooner if concerns arise      Relevant Medications   tirzepatide (MOUNJARO) 10 MG/0.5ML Pen   Other Relevant Orders   Microalbumin / creatinine urine ratio   HIV Antibody (routine testing w rflx)   Hemoglobin A1c   HM DIABETES FOOT EXAM (Completed)   Lipid Profile     Musculoskeletal and Integument   Eczema    Chronic, ongoing Patient reports eczema flare along her hands that is not responding to over-the-counter occlusive moisturizers She is requesting refill of her clobetasol ointment Refill provided today Follow-up as needed      Relevant Medications   clobetasol ointment (TEMOVATE) 0.05 %     Other   Anemia, iron deficiency    Recheck labs, results to dictate further management      Relevant Orders   CBC w/Diff/Platelet   B12   Iron, TIBC and Ferritin Panel   Vitamin D deficiency    Recheck labs, results to dictate further management      Relevant Orders   Vitamin D (25 hydroxy)   S/P gastric sleeve procedure    SP gastric sleeve procedure which was performed in 2022 Will check CMP, CBC, B12, iron panel, A1c, lipid panel May need to run full gastric panel labs follow-up for monitoring She appears to be following appropriate diet and is exercising to maintain weight loss Follow-up 6 months or sooner if concerns arise      Relevant Orders   COMPLETE METABOLIC PANEL WITH GFR     Return in about 3 months (around 04/28/2023) for DM.   I, Mrytle Bento E Ladona Rosten, PA-C, have reviewed all documentation for this visit.  The documentation on 01/26/23 for the exam, diagnosis, procedures, and orders are all accurate and complete.   Jacquelin Hawking, MHS, PA-C [facility]

## 2023-01-26 NOTE — Assessment & Plan Note (Signed)
Chronic, ongoing Patient reports eczema flare along her hands that is not responding to over-the-counter occlusive moisturizers She is requesting refill of her clobetasol ointment Refill provided today Follow-up as needed

## 2023-01-26 NOTE — Assessment & Plan Note (Signed)
Recheck labs, results to dictate further management

## 2023-01-27 ENCOUNTER — Encounter: Payer: Self-pay | Admitting: Family Medicine

## 2023-01-27 ENCOUNTER — Encounter: Payer: Self-pay | Admitting: Physician Assistant

## 2023-01-27 LAB — MICROALBUMIN / CREATININE URINE RATIO
Creatinine, Urine: 41 mg/dL (ref 20–275)
Microalb Creat Ratio: 120 mg/g{creat} — ABNORMAL HIGH (ref <30)
Microalb, Ur: 4.9 mg/dL

## 2023-01-27 LAB — COMPLETE METABOLIC PANEL WITH GFR
AG Ratio: 1 (calc) (ref 1.0–2.5)
ALT: 6 U/L (ref 6–29)
AST: 12 U/L (ref 10–30)
Albumin: 3.5 g/dL — ABNORMAL LOW (ref 3.6–5.1)
Alkaline phosphatase (APISO): 70 U/L (ref 31–125)
BUN: 11 mg/dL (ref 7–25)
CO2: 28 mmol/L (ref 20–32)
Calcium: 8.8 mg/dL (ref 8.6–10.2)
Chloride: 106 mmol/L (ref 98–110)
Creat: 0.69 mg/dL (ref 0.50–0.97)
Globulin: 3.4 g/dL (ref 1.9–3.7)
Glucose, Bld: 46 mg/dL — ABNORMAL LOW (ref 65–99)
Potassium: 3.6 mmol/L (ref 3.5–5.3)
Sodium: 140 mmol/L (ref 135–146)
Total Bilirubin: 0.3 mg/dL (ref 0.2–1.2)
Total Protein: 6.9 g/dL (ref 6.1–8.1)
eGFR: 114 mL/min/{1.73_m2} (ref >=60)

## 2023-01-27 LAB — CBC WITH DIFFERENTIAL/PLATELET
Absolute Monocytes: 408 {cells}/uL (ref 200–950)
Basophils Absolute: 32 {cells}/uL (ref 0–200)
Basophils Relative: 0.6 %
Eosinophils Absolute: 101 {cells}/uL (ref 15–500)
Eosinophils Relative: 1.9 %
HCT: 36.3 % (ref 35.0–45.0)
Hemoglobin: 11.5 g/dL — ABNORMAL LOW (ref 11.7–15.5)
Lymphs Abs: 1961 {cells}/uL (ref 850–3900)
MCH: 25.8 pg — ABNORMAL LOW (ref 27.0–33.0)
MCHC: 31.7 g/dL — ABNORMAL LOW (ref 32.0–36.0)
MCV: 81.6 fL (ref 80.0–100.0)
MPV: 9.7 fL (ref 7.5–12.5)
Monocytes Relative: 7.7 %
Neutro Abs: 2798 {cells}/uL (ref 1500–7800)
Neutrophils Relative %: 52.8 %
Platelets: 315 10*3/uL (ref 140–400)
RBC: 4.45 10*6/uL (ref 3.80–5.10)
RDW: 13.8 % (ref 11.0–15.0)
Total Lymphocyte: 37 %
WBC: 5.3 10*3/uL (ref 3.8–10.8)

## 2023-01-27 LAB — HEMOGLOBIN A1C
Hgb A1c MFr Bld: 5.5 %{Hb} (ref <5.7)
Mean Plasma Glucose: 111 mg/dL
eAG (mmol/L): 6.2 mmol/L

## 2023-01-27 LAB — IRON,TIBC AND FERRITIN PANEL
%SAT: 9 % — ABNORMAL LOW (ref 16–45)
Ferritin: 32 ng/mL (ref 16–154)
Iron: 23 ug/dL — ABNORMAL LOW (ref 40–190)
TIBC: 260 ug/dL (ref 250–450)

## 2023-01-27 LAB — HIV ANTIBODY (ROUTINE TESTING W REFLEX): HIV 1&2 Ab, 4th Generation: NONREACTIVE

## 2023-01-27 LAB — LIPID PANEL
Cholesterol: 159 mg/dL (ref <200)
HDL: 60 mg/dL (ref >=50)
LDL Cholesterol (Calc): 86 mg/dL
Non-HDL Cholesterol (Calc): 99 mg/dL (ref <130)
Total CHOL/HDL Ratio: 2.7 (calc) (ref <5.0)
Triglycerides: 53 mg/dL (ref <150)

## 2023-01-27 LAB — VITAMIN D 25 HYDROXY (VIT D DEFICIENCY, FRACTURES): Vit D, 25-Hydroxy: 16 ng/mL — ABNORMAL LOW (ref 30–100)

## 2023-01-27 LAB — VITAMIN B12: Vitamin B-12: 586 pg/mL (ref 200–1100)

## 2023-01-27 NOTE — Telephone Encounter (Signed)
Called pt to verify if her insurance card from 2022 is the same as this year since no records found for 2024. Pt unable to tell if its the same or not. Pt also stated when she called back the pharmacy she was told no PA needed just no mounjaro in stock. I advised pt to continue calling pharmacy to ensure if they happen to have any in stock and if she came to an issue with a PA needed to let us know through MyChart message and to send her insurance card information for this year. Pt verbalized understanding and will keep [place] updated.

## 2023-01-29 ENCOUNTER — Telehealth: Payer: Self-pay | Admitting: Family Medicine

## 2023-01-29 NOTE — Telephone Encounter (Signed)
Pt is calling back requesting to speak with Maralyn Sago the practice Admin. Pt says she has a complaint and she wants to speak with the admin. Pt says she called earlier around 1 and still hasn't heard from anyone. Please follow up with pt.

## 2023-01-29 NOTE — Telephone Encounter (Signed)
Pt is calling to feel a complaint regarding Denny Peon Mecum regarding office visit on 01/26/23. Patient states that her medication is titrated up every 3 months. Medication was not increased. HIV testing was completed by a covering provider and was not asked if wanted testing to be completed. And labs have not be resulted. Pt would like to follow up with the office manager today. Please advise  CB- [phone]

## 2023-01-30 ENCOUNTER — Encounter: Payer: Self-pay | Admitting: Physician Assistant

## 2023-01-30 MED ORDER — VITAMIN D (ERGOCALCIFEROL) 1.25 MG (50000 UNIT) PO CAPS
50000.0000 [IU] | ORAL_CAPSULE | ORAL | 0 refills | Status: DC
Start: 2023-01-30 — End: 2023-07-14

## 2023-01-30 NOTE — Addendum Note (Signed)
Addended by: Jacquelin Hawking on: 01/30/2023 09:53 AM   Modules accepted: Orders

## 2023-01-30 NOTE — Progress Notes (Signed)
Your labs are back Your cholesterol looks good Your A1c was 5.5 which is great. Your B12 was in normal range Your HIV testing was negative Your iron levels are still little low which is correlated with the anemia seen on your CBC.  Your CBC was only slightly outside of normal ranges.  If you are not already I would start taking an iron supplement to help correct this. Your electrolytes, liver and kidney function were overall normal at this time Your vitamin D was very low.  I have sent in a supplement for you to take once per week to help improve this.  We can recheck this in 3 months for monitoring Please let us know if you have further questions or concerns

## 2023-02-03 ENCOUNTER — Ambulatory Visit: Payer: BC Managed Care – PPO | Admitting: Family Medicine

## 2023-02-18 NOTE — Telephone Encounter (Signed)
Pt is requesting Leisa review medication increase request since she is her regular provider for this medication and discussed previously about increasing.

## 2023-04-13 ENCOUNTER — Encounter: Payer: Self-pay | Admitting: Hematology and Oncology

## 2023-04-20 ENCOUNTER — Other Ambulatory Visit: Payer: Self-pay

## 2023-04-20 ENCOUNTER — Encounter: Payer: Self-pay | Admitting: Internal Medicine

## 2023-04-20 ENCOUNTER — Ambulatory Visit: Payer: 59 | Admitting: Internal Medicine

## 2023-04-20 ENCOUNTER — Encounter: Payer: Self-pay | Admitting: Hematology and Oncology

## 2023-04-20 VITALS — BP 124/84 | HR 97 | Temp 98.0°F | Resp 16 | Ht 61.0 in | Wt 204.5 lb

## 2023-04-20 DIAGNOSIS — E119 Type 2 diabetes mellitus without complications: Secondary | ICD-10-CM | POA: Diagnosis not present

## 2023-04-20 DIAGNOSIS — Z7985 Long-term (current) use of injectable non-insulin antidiabetic drugs: Secondary | ICD-10-CM

## 2023-04-20 DIAGNOSIS — E669 Obesity, unspecified: Secondary | ICD-10-CM

## 2023-04-20 MED ORDER — TIRZEPATIDE 12.5 MG/0.5ML ~~LOC~~ SOAJ
12.5000 mg | SUBCUTANEOUS | 0 refills | Status: DC
Start: 2023-04-20 — End: 2023-07-09

## 2023-04-20 NOTE — Assessment & Plan Note (Signed)
 Controlled, is too soon to check an A1c today but plan to do it at follow-up.

## 2023-04-20 NOTE — Assessment & Plan Note (Signed)
 Patient is mainly using Mounjaro for further weight loss.  She has plateaued over the last 2 months, agree with dose changed to 12.5 mg weekly.  Will reevaluate in 3 months.  Patient's goal weight is about 180 pounds which I think is doable for her.

## 2023-04-20 NOTE — Progress Notes (Signed)
 Established Patient Office Visit  Subjective   [name], female    DOB: 1986-03-18  Age: 38 y.o. MRN: [id number]  Chief Complaint  Patient presents with   Medical Management of Chronic Issues    3 month follow up    HPI  Patient is here for follow up on chronic medical conditions.   Diabetes, Type 2: -Last A1c 5.5% 10/24 -Medications: Mounjaro  10 mg for 6 months, lost weight initially at this dose but has plateaued over the last 2 months -History of gastric bypass surgery, had been on Wegovy but plateaued and transition to Mounjaro  for weight maintenance -Patient is compliant with the above medications and reports no side effects.  -Foot exam: Up-to-date -Microalbumin: Up-to-date -Statin: No -Denies symptoms of hypoglycemia, polyuria, polydipsia, numbness extremities, foot ulcers/trauma.      Review of Systems  Gastrointestinal:  Negative for abdominal pain, constipation, heartburn, nausea and vomiting.      Objective:     BP 124/84 (Cuff Size: Large)   Pulse 97   Temp 98 F (36.7 C) (Oral)   Resp 16   Ht 5' 1 (1.549 m)   Wt 204 lb 8 oz (92.8 kg)   BMI 38.64 kg/m  BP Readings from Last 3 Encounters:  04/20/23 124/84  01/26/23 110/80  09/01/22 118/72   Wt Readings from Last 3 Encounters:  04/20/23 204 lb 8 oz (92.8 kg)  01/26/23 206 lb 3.2 oz (93.5 kg)  09/01/22 218 lb 8 oz (99.1 kg)      Physical Exam Constitutional:      Appearance: Normal appearance.  HENT:     Head: Normocephalic and atraumatic.  Eyes:     Conjunctiva/sclera: Conjunctivae normal.  Cardiovascular:     Rate and Rhythm: Normal rate and regular rhythm.  Pulmonary:     Effort: Pulmonary effort is normal.     Breath sounds: Normal breath sounds.  Skin:    General: Skin is warm and dry.  Neurological:     General: No focal deficit present.     Mental Status: She is alert. Mental status is at baseline.  Psychiatric:        Mood and Affect: Mood normal.         Behavior: Behavior normal.      No results found for any visits on 04/20/23.  Last CBC Lab Results  Component Value Date   WBC 5.3 01/26/2023   HGB 11.5 (L) 01/26/2023   HCT 36.3 01/26/2023   MCV 81.6 01/26/2023   MCH 25.8 (L) 01/26/2023   RDW 13.8 01/26/2023   PLT 315 01/26/2023   Last metabolic panel Lab Results  Component Value Date   GLUCOSE 46 (L) 01/26/2023   NA 140 01/26/2023   K 3.6 01/26/2023   CL 106 01/26/2023   CO2 28 01/26/2023   BUN 11 01/26/2023   CREATININE 0.69 01/26/2023   EGFR 114 01/26/2023   CALCIUM 8.8 01/26/2023   PROT 6.9 01/26/2023   ALBUMIN 3.4 (L) 12/10/2019   LABGLOB 3.7 08/01/2015   AGRATIO 1.1 (L) 08/01/2015   BILITOT 0.3 01/26/2023   ALKPHOS 68 12/10/2019   AST 12 01/26/2023   ALT 6 01/26/2023   ANIONGAP 8 12/10/2019   Last lipids Lab Results  Component Value Date   CHOL 159 01/26/2023   HDL 60 01/26/2023   LDLCALC 86 01/26/2023   TRIG 53 01/26/2023   CHOLHDL 2.7 01/26/2023   Last hemoglobin A1c Lab Results  Component Value Date  HGBA1C 5.5 01/26/2023   Last thyroid  functions Lab Results  Component Value Date   TSH 3.85 07/28/2017   Last vitamin D  Lab Results  Component Value Date   VD25OH 16 (L) 01/26/2023   Last vitamin B12 and Folate Lab Results  Component Value Date   VITAMINB12 586 01/26/2023   FOLATE 12.5 02/13/2017      The ASCVD Risk score (Arnett DK, et al., 2019) failed to calculate for the following reasons:   The 2019 ASCVD risk score is only valid for ages 86 to 76    Assessment & Plan:  Type 2 diabetes mellitus without complication, without long-term current use of insulin (HCC) Assessment & Plan: Controlled, is too soon to check an A1c today but plan to do it at follow-up.  Orders: -     Tirzepatide ; Inject 12.5 mg into the skin once a week.  Dispense: 6 mL; Refill: 0  Obesity (BMI 30-39.9) Assessment & Plan: Patient is mainly using Mounjaro  for further weight loss.  She has  plateaued over the last 2 months, agree with dose changed to 12.5 mg weekly.  Will reevaluate in 3 months.  Patient's goal weight is about 180 pounds which I think is doable for her.      Return in about 3 months (around 07/19/2023) for me or Leisa.    Sharyle Fischer, DO

## 2023-04-21 ENCOUNTER — Telehealth: Payer: Self-pay | Admitting: Family Medicine

## 2023-04-21 NOTE — Telephone Encounter (Unsigned)
 Copied from CRM [id number]. Topic: General - Other >> Apr 21, 2023  2:29 PM Rosaria BRAVO wrote: Reason for CRM: Ava D from [facility] [employer] called to report that a fax is incoming regarding a prior auth for mounjaro . Asked if this can be marked urgent because patient is out of her medication.  Best contact: [phone]

## 2023-04-21 NOTE — Telephone Encounter (Signed)
 Pt called in states needs PA for Mounjaro 12.5

## 2023-04-21 NOTE — Telephone Encounter (Signed)
 Once paperwork received I will fill out and send in [place].

## 2023-04-23 NOTE — Telephone Encounter (Signed)
Paperwork still not received.

## 2023-04-28 ENCOUNTER — Ambulatory Visit: Payer: BC Managed Care – PPO | Admitting: Family Medicine

## 2023-07-09 ENCOUNTER — Encounter: Payer: Self-pay | Admitting: Family Medicine

## 2023-07-09 ENCOUNTER — Ambulatory Visit: Payer: PRIVATE HEALTH INSURANCE | Admitting: Family Medicine

## 2023-07-09 ENCOUNTER — Encounter: Payer: Self-pay | Admitting: Hematology and Oncology

## 2023-07-09 VITALS — BP 128/78 | HR 71 | Resp 16 | Ht 61.0 in | Wt 200.0 lb

## 2023-07-09 DIAGNOSIS — F32A Depression, unspecified: Secondary | ICD-10-CM

## 2023-07-09 DIAGNOSIS — F419 Anxiety disorder, unspecified: Secondary | ICD-10-CM

## 2023-07-09 DIAGNOSIS — E1169 Type 2 diabetes mellitus with other specified complication: Secondary | ICD-10-CM

## 2023-07-09 DIAGNOSIS — E559 Vitamin D deficiency, unspecified: Secondary | ICD-10-CM | POA: Diagnosis not present

## 2023-07-09 DIAGNOSIS — D509 Iron deficiency anemia, unspecified: Secondary | ICD-10-CM | POA: Diagnosis not present

## 2023-07-09 DIAGNOSIS — E119 Type 2 diabetes mellitus without complications: Secondary | ICD-10-CM

## 2023-07-09 DIAGNOSIS — N946 Dysmenorrhea, unspecified: Secondary | ICD-10-CM

## 2023-07-09 DIAGNOSIS — G43009 Migraine without aura, not intractable, without status migrainosus: Secondary | ICD-10-CM

## 2023-07-09 DIAGNOSIS — K219 Gastro-esophageal reflux disease without esophagitis: Secondary | ICD-10-CM

## 2023-07-09 MED ORDER — TIRZEPATIDE 12.5 MG/0.5ML ~~LOC~~ SOAJ: 12.5000 mg | SUBCUTANEOUS | 0 refills | Status: DC

## 2023-07-09 NOTE — Assessment & Plan Note (Signed)
 Chronic, pt states last iron infusion was about a year ago Her last hemoglobin had improved to around 11 She states she cannot tell a difference in symptoms after her infusions or with the lab differences She does want to check her blood counts and iron panel today she continues to take oral iron supplement

## 2023-07-09 NOTE — Assessment & Plan Note (Signed)
 She has been on high-dose prescription vitamin D supplement and wants to recheck labs today depending on the results we will recommend transitioning to daily over-the-counter supplement for continued the prescription Last vitamin D Lab Results  Component Value Date   VD25OH 16 (L) 01/26/2023

## 2023-07-09 NOTE — Assessment & Plan Note (Signed)
 Migraines sig improved over the past 1-2 years with weight loss Not requiring meds for FMLA papers

## 2023-07-09 NOTE — Progress Notes (Signed)
 Name: Sherri Robertson   MRN: [id number]    DOB: [birth year]   Date:07/09/2023       Progress Note  Chief Complaint  Patient presents with  . Medical Management of Chronic Issues     Subjective:   Sherri Robertson is a 38 y.o. female, presents to clinic for routine follow up on chronic conditions  Here for med refill and med check DM:   Pt managing DM with mounjaro Denies: Polyuria, polydipsia, vision changes, neuropathy, hypoglycemia Recent pertinent labs: Lab Results  Component Value Date   HGBA1C 5.5 01/26/2023   HGBA1C 5.3 07/21/2022   HGBA1C 6.2 (H) 07/14/2019   Lab Results  Component Value Date   MICROALBUR 4.9 01/26/2023   LDLCALC 86 01/26/2023   CREATININE 0.69 01/26/2023   Obesity - working on healthy diet and exercising, slowly losing weight  Wt Readings from Last 5 Encounters:  07/09/23 200 lb (90.7 kg)  04/20/23 204 lb 8 oz (92.8 kg)  01/26/23 206 lb 3.2 oz (93.5 kg)  09/01/22 218 lb 8 oz (99.1 kg)  01/03/22 210 lb (95.3 kg)   BMI Readings from Last 5 Encounters:  07/09/23 37.79 kg/m  04/20/23 38.64 kg/m  01/26/23 38.96 kg/m  09/01/22 41.29 kg/m  01/03/22 39.68 kg/m    IDA on bariatric iron chewable Lab Results  Component Value Date   IRON 23 (L) 01/26/2023   TIBC 260 01/26/2023   FERRITIN 32 01/26/2023  Last iron infusion 1 year ago [facility] hematology  Hemoglobin  Date Value Ref Range Status  01/26/2023 11.5 (L) 11.7 - 15.5 g/dL Final  21/30/8657 9.7 (L) 12.0 - 15.0 g/dL Final  84/69/6295 9.7 (L) 11.7 - 15.5 g/dL Final  28/41/3244 01.0 (L) 12.0 - 15.0 g/dL Final  27/25/3664 9.7 (L) 11.1 - 15.9 g/dL Final   HGB  Date Value Ref Range Status  08/09/2013 9.1 (L) 12.0 - 16.0 g/dL Final  40/34/7425 8.7 (L) 12.0 - 16.0 g/dL Final  95/63/8756 8.2 (L) 12.0 - 16.0 g/dL Final  43/32/9518 9.3 (L) 12.0 - 16.0 g/dL Final      Current Outpatient Medications:  .  clobetasol ointment (TEMOVATE) 0.05 %, APPLY TO AFFECTED AREAS ON HANDS TWICE  DAILY UNTIL CLEAR, THEN AS NEEDED, Disp: 30 g, Rfl: 1 .  cyclobenzaprine (FLEXERIL) 5 MG tablet, Take 1 tablet (5 mg total) by mouth at bedtime. Take as needed, Disp: 90 tablet, Rfl: 3 .  Iron-Folic Acid-C-B6-B12-Zinc 150-1.25 MG TABS, Take 1 tablet by mouth at bedtime., Disp: 90 tablet, Rfl: 1 .  Vitamin D, Ergocalciferol, (DRISDOL) 1.25 MG (50000 UNIT) CAPS capsule, Take 1 capsule (50,000 Units total) by mouth every 7 (seven) days., Disp: 12 capsule, Rfl: 0 .  tirzepatide (MOUNJARO) 12.5 MG/0.5ML Pen, Inject 12.5 mg into the skin once a week., Disp: 6 mL, Rfl: 0  Patient Active Problem List   Diagnosis Date Noted  . Obesity (BMI 30-39.9) 04/20/2023  . Type 2 diabetes mellitus without complication, without long-term current use of insulin (HCC) 01/26/2023  . 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 Eczema 01/26/2023  . Anemia 05/31/2021  . Iron deficiency 05/31/2021  . COVID-19 10/13/2019  . Migraine without aura and without status migrainosus, not intractable 12/01/2018  . Gastroesophageal reflux disease 12/01/2018  . 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 Thyroid nodule 01/22/2017  . Vitamin D deficiency 01/22/2017  . Acanthosis nigricans 08/05/2015  . Anxiety and depression 01/26/2015  . Dysmenorrhea 01/26/2015  . Fibroid 01/26/2015  . Anemia, iron deficiency  09/21/2009    Past Surgical History:  Procedure Laterality Date  . CESAREAN SECTION    . LAPAROSCOPIC GASTRIC SLEEVE RESECTION  2022  . TONSILLECTOMY      Family History  Problem Relation Age of Onset  . Hypertension Mother   . Depression Mother   . Hyperlipidemia Mother   . Other Father        unknown medical history  . Depression Sister   . Anxiety disorder Sister   . Anxiety disorder Brother   . Eczema Daughter   . COPD Maternal Grandmother   . Emphysema Maternal Grandmother   . Cancer Maternal Grandfather        stomach cancer    Social History   Tobacco Use  . Smoking status: Never    Passive exposure:  Never  . Smokeless tobacco: Never  Vaping Use  . Vaping status: Never Used  Substance Use Topics  . Alcohol use: Yes    Comment: occassional - 3 glasses wine/month  . Drug use: No     Allergies  Allergen Reactions  . Metformin And Related Diarrhea    Stomach upset/diarrhea, tried it 2x with same side effect    Health Maintenance  Topic Date Due  . Cervical Cancer Screening (HPV/Pap Cotest)  05/26/2022  . OPHTHALMOLOGY EXAM  07/09/2023 (Originally 12/16/1994)  . COVID-19 Vaccine (1 - 2024-25 season) 07/25/2023 (Originally 12/14/2022)  . DTaP/Tdap/Td (1 - Tdap) 01/26/2024 (Originally 12/16/2003)  . Pneumococcal Vaccine 25-36 Years old (1 of 2 - PCV) 07/08/2024 (Originally 12/16/1990)  . HEMOGLOBIN A1C  07/27/2023  . Diabetic kidney evaluation - eGFR measurement  01/26/2024  . Diabetic kidney evaluation - Urine ACR  01/26/2024  . FOOT EXAM  01/26/2024  . INFLUENZA VACCINE  Completed  . Hepatitis C Screening  Completed  . HIV Screening  Completed  . HPV VACCINES  Aged Out    Chart Review Today: I personally reviewed active problem list, medication list, allergies, family history, social history, health maintenance, notes from last encounter, lab results, imaging with the patient/caregiver today.   Review of Systems  Constitutional: Negative.   HENT: Negative.    Eyes: Negative.   Respiratory: Negative.    Cardiovascular: Negative.   Gastrointestinal: Negative.   Endocrine: Negative.   Genitourinary: Negative.   Musculoskeletal: Negative.   Skin: Negative.   Allergic/Immunologic: Negative.   Neurological: Negative.   Hematological: Negative.   Psychiatric/Behavioral: Negative.    All other systems reviewed and are negative.    Objective:   Vitals:   07/09/23 0900  BP: 128/78  Pulse: 71  Resp: 16  SpO2: 99%  Weight: 200 lb (90.7 kg)  Height: 5\' 1"  (1.549 m)    Body mass index is 37.79 kg/m.  Physical Exam Vitals and nursing note reviewed.  Constitutional:       General: She is not in acute distress.    Appearance: Normal appearance. She is well-developed. She is not ill-appearing, toxic-appearing or diaphoretic.  HENT:     Head: Normocephalic and atraumatic.     Nose: Nose normal.  Eyes:     General:        Right eye: No discharge.        Left eye: No discharge.     Conjunctiva/sclera: Conjunctivae normal.  Neck:     Trachea: No tracheal deviation.  Cardiovascular:     Rate and Rhythm: Normal rate and regular rhythm.     Pulses: Normal pulses.     Heart sounds: Normal heart  sounds. No murmur heard.    No friction rub. No gallop.  Pulmonary:     Effort: Pulmonary effort is normal. No respiratory distress.     Breath sounds: No stridor.  Musculoskeletal:     Right lower leg: No edema.     Left lower leg: No edema.  Skin:    General: Skin is warm and dry.     Findings: No rash.  Neurological:     Mental Status: She is alert.     Motor: No abnormal muscle tone.     Coordination: Coordination normal.  Psychiatric:        Behavior: Behavior normal.     Functional Status Survey:   Results for orders placed or performed in visit on 01/26/23  Microalbumin / creatinine urine ratio   Collection Time: 01/26/23 10:09 AM  Result Value Ref Range   Creatinine, Urine 41 20 - 275 mg/dL   Microalb, Ur 4.9 mg/dL   Microalb Creat Ratio 120 (H) <30 mg/g creat  HIV Antibody (routine testing w rflx)   Collection Time: 01/26/23 10:09 AM  Result Value Ref Range   HIV 1&2 Ab, 4th Generation NON-REACTIVE NON-REACTIVE  Hemoglobin A1c   Collection Time: 01/26/23 10:09 AM  Result Value Ref Range   Hgb A1c MFr Bld 5.5 <5.7 % of total Hgb   Mean Plasma Glucose 111 mg/dL   eAG (mmol/L) 6.2 mmol/L  Lipid Profile   Collection Time: 01/26/23 10:09 AM  Result Value Ref Range   Cholesterol 159 <200 mg/dL   HDL 60 > OR = 50 mg/dL   Triglycerides 53 <161 mg/dL   LDL Cholesterol (Calc) 86 mg/dL (calc)   Total CHOL/HDL Ratio 2.7 <5.0 (calc)   Non-HDL  Cholesterol (Calc) 99 <130 mg/dL (calc)  CBC w/Diff/Platelet   Collection Time: 01/26/23 10:09 AM  Result Value Ref Range   WBC 5.3 3.8 - 10.8 Thousand/uL   RBC 4.45 3.80 - 5.10 Million/uL   Hemoglobin 11.5 (L) 11.7 - 15.5 g/dL   HCT 09.6 04.5 - 40.9 %   MCV 81.6 80.0 - 100.0 fL   MCH 25.8 (L) 27.0 - 33.0 pg   MCHC 31.7 (L) 32.0 - 36.0 g/dL   RDW 81.1 91.4 - 78.2 %   Platelets 315 140 - 400 Thousand/uL   MPV 9.7 7.5 - 12.5 fL   Neutro Abs 2,798 1,500 - 7,800 cells/uL   Absolute Lymphocytes 1,961 850 - 3,900 cells/uL   Absolute Monocytes 408 200 - 950 cells/uL   Eosinophils Absolute 101 15 - 500 cells/uL   Basophils Absolute 32 0 - 200 cells/uL   Neutrophils Relative % 52.8 %   Total Lymphocyte 37.0 %   Monocytes Relative 7.7 %   Eosinophils Relative 1.9 %   Basophils Relative 0.6 %  COMPLETE METABOLIC PANEL WITH GFR   Collection Time: 01/26/23 10:09 AM  Result Value Ref Range   Glucose, Bld 46 (L) 65 - 99 mg/dL   BUN 11 7 - 25 mg/dL   Creat 9.56 2.13 - 0.86 mg/dL   eGFR 578 > OR = 60 IO/NGE/9.52W4   BUN/Creatinine Ratio SEE NOTE: 6 - 22 (calc)   Sodium 140 135 - 146 mmol/L   Potassium 3.6 3.5 - 5.3 mmol/L   Chloride 106 98 - 110 mmol/L   CO2 28 20 - 32 mmol/L   Calcium 8.8 8.6 - 10.2 mg/dL   Total Protein 6.9 6.1 - 8.1 g/dL   Albumin 3.5 (L) 3.6 - 5.1 g/dL  Globulin 3.4 1.9 - 3.7 g/dL (calc)   AG Ratio 1.0 1.0 - 2.5 (calc)   Total Bilirubin 0.3 0.2 - 1.2 mg/dL   Alkaline phosphatase (APISO) 70 31 - 125 U/L   AST 12 10 - 30 U/L   ALT 6 6 - 29 U/L  B12   Collection Time: 01/26/23 10:09 AM  Result Value Ref Range   Vitamin B-12 586 200 - 1,100 pg/mL  Iron, TIBC and Ferritin Panel   Collection Time: 01/26/23 10:09 AM  Result Value Ref Range   Iron 23 (L) 40 - 190 mcg/dL   TIBC 161 096 - 045 mcg/dL (calc)   %SAT 9 (L) 16 - 45 % (calc)   Ferritin 32 16 - 154 ng/mL  Vitamin D (25 hydroxy)   Collection Time: 01/26/23 10:09 AM  Result Value Ref Range   Vit D,  25-Hydroxy 16 (L) 30 - 100 ng/mL      Assessment & Plan:   Type 2 diabetes mellitus without complication, without long-term current use of insulin (HCC) Assessment & Plan: Diabetes and A1c has really well-controlled with Mounjaro She is currently on 12.5 mg weekly dose and would like to continue this for now Recheck labs and refill meds F/up in 6 months (see note below about next refills and dosing)  Orders: -     Hemoglobin A1c -     Tirzepatide; Inject 12.5 mg into the skin once a week.  Dispense: 6 mL; Refill: 0  Vitamin D deficiency Assessment & Plan: She has been on high-dose prescription vitamin D supplement and wants to recheck labs today depending on the results we will recommend transitioning to daily over-the-counter supplement for continued the prescription Last vitamin D Lab Results  Component Value Date   VD25OH 16 (L) 01/26/2023    Orders: -     VITAMIN D 25 Hydroxy (Vit-D Deficiency, Fractures) -     COMPLETE METABOLIC PANEL WITHOUT GFR  Iron deficiency anemia, unspecified iron deficiency anemia type Assessment & Plan: Chronic, pt states last iron infusion was about a year ago Her last hemoglobin had improved to around 11 She states she cannot tell a difference in symptoms after her infusions or with the lab differences She does want to check her blood counts and iron panel today she continues to take oral iron supplement  Orders: -     CBC with Differential/Platelet -     Iron, TIBC and Ferritin Panel  Anxiety and depression Assessment & Plan: No current concerns with moods, not on meds right now    07/09/2023    9:02 AM 01/26/2023    9:26 AM 10/30/2022    9:21 AM  Depression screen PHQ 2/9  Decreased Interest 0 0 0  Down, Depressed, Hopeless 0 0 0  PHQ - 2 Score 0 0 0  Altered sleeping  0 0  Tired, decreased energy  0 0  Change in appetite  0 0  Feeling bad or failure about yourself   0 0  Trouble concentrating  0 0  Moving slowly or  fidgety/restless  0 0  Suicidal thoughts  0 0  PHQ-9 Score  0 0  Difficult doing work/chores  Not difficult at all Not difficult at all  PHQ screening done and reviewed today, neg/normal    Dysmenorrhea Assessment & Plan: Manage by Dr. Valentino Saxon - but she is leaving the practice - will help her get est with new GYN if needs for procedures etc. UTD on cervical CA screening -  needs to be abstracted and updated in chart, done last year   Migraine without aura and without status migrainosus, not intractable Assessment & Plan: Migraines sig improved over the past 1-2 years with weight loss Not requiring meds for FMLA papers   Gastroesophageal reflux disease, unspecified whether esophagitis present Assessment & Plan: Sx currently well controlled with diet/lifestyle efforts not on Rx     Pt will send a mychart msg in 3 months (or when mounjaro supply runs out) about her preference for dosing and refills - if she wants to stay on 12.5 mg or increase to 15 mg and she will not need an appt for this, please route to me for refills    Return in about 6 months (around 01/09/2024) for Annual Physical.   Danelle Berry, PA-C 07/09/23 11:41 AM

## 2023-07-09 NOTE — Assessment & Plan Note (Signed)
 Sx currently well controlled with diet/lifestyle efforts not on Rx

## 2023-07-09 NOTE — Assessment & Plan Note (Signed)
 Manage by Dr. Valentino Saxon - but she is leaving the practice - will help her get est with new GYN if needs for procedures etc. UTD on cervical CA screening - needs to be abstracted and updated in chart, done last year

## 2023-07-09 NOTE — Assessment & Plan Note (Signed)
 Diabetes and A1c has really well-controlled with [facility] She is currently on 12.5 mg weekly dose and would like to continue this for now Recheck labs and refill meds F/up in 6 months (see note below about next refills and dosing)

## 2023-07-09 NOTE — Assessment & Plan Note (Signed)
 No current concerns with moods, not on meds right now    07/09/2023    9:02 AM 01/26/2023    9:26 AM 10/30/2022    9:21 AM  Depression screen PHQ 2/9  Decreased Interest 0 0 0  Down, Depressed, Hopeless 0 0 0  PHQ - 2 Score 0 0 0  Altered sleeping  0 0  Tired, decreased energy  0 0  Change in appetite  0 0  Feeling bad or failure about yourself   0 0  Trouble concentrating  0 0  Moving slowly or fidgety/restless  0 0  Suicidal thoughts  0 0  PHQ-9 Score  0 0  Difficult doing work/chores  Not difficult at all Not difficult at all  PHQ screening done and reviewed today, neg/normal

## 2023-07-10 ENCOUNTER — Ambulatory Visit: Payer: PRIVATE HEALTH INSURANCE | Admitting: Family Medicine

## 2023-07-10 LAB — COMPLETE METABOLIC PANEL WITHOUT GFR
AG Ratio: 1.3 (calc) (ref 1.0–2.5)
ALT: 7 U/L (ref 6–29)
AST: 15 U/L (ref 10–30)
Albumin: 3.9 g/dL (ref 3.6–5.1)
Alkaline phosphatase (APISO): 71 U/L (ref 31–125)
BUN: 11 mg/dL (ref 7–25)
CO2: 28 mmol/L (ref 20–32)
Calcium: 8.6 mg/dL (ref 8.6–10.2)
Chloride: 105 mmol/L (ref 98–110)
Creat: 0.55 mg/dL (ref 0.50–0.97)
Globulin: 3.1 g/dL (ref 1.9–3.7)
Glucose, Bld: 77 mg/dL (ref 65–99)
Potassium: 4.3 mmol/L (ref 3.5–5.3)
Sodium: 141 mmol/L (ref 135–146)
Total Bilirubin: 0.4 mg/dL (ref 0.2–1.2)
Total Protein: 7 g/dL (ref 6.1–8.1)

## 2023-07-10 LAB — CBC WITH DIFFERENTIAL/PLATELET
Absolute Lymphocytes: 1923 {cells}/uL (ref 850–3900)
Absolute Monocytes: 352 {cells}/uL (ref 200–950)
Basophils Absolute: 20 {cells}/uL (ref 0–200)
Basophils Relative: 0.4 %
Eosinophils Absolute: 71 {cells}/uL (ref 15–500)
Eosinophils Relative: 1.4 %
HCT: 37.7 % (ref 35.0–45.0)
Hemoglobin: 11.7 g/dL (ref 11.7–15.5)
MCH: 25 pg — ABNORMAL LOW (ref 27.0–33.0)
MCHC: 31 g/dL — ABNORMAL LOW (ref 32.0–36.0)
MCV: 80.6 fL (ref 80.0–100.0)
MPV: 9.9 fL (ref 7.5–12.5)
Monocytes Relative: 6.9 %
Neutro Abs: 2734 {cells}/uL (ref 1500–7800)
Neutrophils Relative %: 53.6 %
Platelets: 331 10*3/uL (ref 140–400)
RBC: 4.68 10*6/uL (ref 3.80–5.10)
RDW: 14.1 % (ref 11.0–15.0)
Total Lymphocyte: 37.7 %
WBC: 5.1 10*3/uL (ref 3.8–10.8)

## 2023-07-10 LAB — HEMOGLOBIN A1C
Hgb A1c MFr Bld: 5.6 %{Hb} (ref <5.7)
Mean Plasma Glucose: 114 mg/dL
eAG (mmol/L): 6.3 mmol/L

## 2023-07-10 LAB — IRON,TIBC AND FERRITIN PANEL
%SAT: 10 % — ABNORMAL LOW (ref 16–45)
Ferritin: 15 ng/mL — ABNORMAL LOW (ref 16–154)
Iron: 27 ug/dL — ABNORMAL LOW (ref 40–190)
TIBC: 264 ug/dL (ref 250–450)

## 2023-07-10 LAB — VITAMIN D 25 HYDROXY (VIT D DEFICIENCY, FRACTURES): Vit D, 25-Hydroxy: 27 ng/mL — ABNORMAL LOW (ref 30–100)

## 2023-07-14 ENCOUNTER — Other Ambulatory Visit: Payer: Self-pay | Admitting: Family Medicine

## 2023-07-14 ENCOUNTER — Encounter: Payer: Self-pay | Admitting: Family Medicine

## 2023-07-14 DIAGNOSIS — E559 Vitamin D deficiency, unspecified: Secondary | ICD-10-CM

## 2023-07-14 MED ORDER — VITAMIN D (ERGOCALCIFEROL) 1.25 MG (50000 UNIT) PO CAPS: 50000.0000 [IU] | ORAL_CAPSULE | ORAL | 3 refills | Status: DC

## 2023-07-22 ENCOUNTER — Ambulatory Visit: Payer: Self-pay | Admitting: Family Medicine

## 2023-09-01 ENCOUNTER — Encounter: Payer: Self-pay | Admitting: Hematology and Oncology

## 2023-09-22 ENCOUNTER — Encounter: Payer: Self-pay | Admitting: Family Medicine

## 2023-09-23 MED ORDER — TIRZEPATIDE 15 MG/0.5ML ~~LOC~~ SOAJ: 15.0000 mg | SUBCUTANEOUS | 1 refills | Status: DC

## 2023-09-24 ENCOUNTER — Other Ambulatory Visit (HOSPITAL_COMMUNITY): Payer: Self-pay

## 2023-09-24 ENCOUNTER — Telehealth: Payer: Self-pay | Admitting: Pharmacy Technician

## 2023-09-24 ENCOUNTER — Encounter: Payer: Self-pay | Admitting: Hematology and Oncology

## 2023-09-24 NOTE — Telephone Encounter (Signed)
 Pharmacy Patient Advocate Encounter   Received notification from CoverMyMeds that prior authorization for Mounjaro  15MG /0.5ML auto-injectors is required/requested.   Insurance verification completed.   The patient is insured through [employer] [facility] .   Per test claim: PA required; PA submitted to above mentioned insurance via CoverMyMeds Key/confirmation #/EOC [id number] Status is pending

## 2023-09-24 NOTE — Telephone Encounter (Signed)
 Ok thanks I will go ahead and submit the PA

## 2023-09-24 NOTE — Telephone Encounter (Signed)
 Pharmacy Patient Advocate Encounter   Received notification from Onbase that prior authorization for Mounjaro  15MG /0.5ML auto-injectors is required/requested.   Insurance verification completed.   The patient is insured through [employer] [facility] [employer] [facility] .   Per test claim: PA required; PA started via CoverMyMeds. KEY [id number] . Please see clinical question(s) below that I am not finding the answer to in their chart and advise.      The last A1C that I can find in her chart is 5.6, is this correct and would you like for me to submit the PA with that info?

## 2023-09-25 ENCOUNTER — Other Ambulatory Visit (HOSPITAL_COMMUNITY): Payer: Self-pay

## 2023-09-25 NOTE — Telephone Encounter (Signed)
 Pharmacy Patient Advocate Encounter  Received notification from [employer] that Prior Authorization for Mounjaro  15MG /0.5ML auto-injectors has been DENIED.  Full denial letter will be uploaded to the media tab. See denial reason below.  A1C not in acceptable range  PA #/Case ID/Reference #: [id number]

## 2023-10-07 ENCOUNTER — Encounter: Payer: Self-pay | Admitting: Family Medicine

## 2023-10-07 LAB — HM DIABETES EYE EXAM

## 2023-11-20 ENCOUNTER — Other Ambulatory Visit: Payer: Self-pay | Admitting: Family Medicine

## 2023-11-20 DIAGNOSIS — M542 Cervicalgia: Secondary | ICD-10-CM

## 2023-11-24 NOTE — Telephone Encounter (Signed)
 Requested medication (s) are due for refill today: yes  Requested medication (s) are on the active medication list: yes  Last refill:  09/12/22 #90 3 RF  Future visit scheduled: yes  Notes to clinic:  med not delegated to NT to RF   Requested Prescriptions  Pending Prescriptions Disp Refills   cyclobenzaprine  (FLEXERIL ) 5 MG tablet [Pharmacy Med Name: cyclobenzaprine  5 mg tablet (FLEXERIL )] 90 tablet 3    Sig: Take 1 tablet (5 mg total) by mouth at bedtime as needed.     Not Delegated - Analgesics:  Muscle Relaxants Failed - 11/24/2023  3:06 PM      Failed - This refill cannot be delegated      Passed - Valid encounter within last 6 months    Recent Outpatient Visits           4 months ago Type 2 diabetes mellitus without complication, without long-term current use of insulin [facility])   [place] [facility] [place] [facility] Leavy Mole, PA-C       Future Appointments             In 1 month Tapia, Leisa, PA-C [facility]

## 2023-12-15 ENCOUNTER — Ambulatory Visit: Admitting: Family Medicine

## 2024-01-11 ENCOUNTER — Encounter: Admitting: Family Medicine

## 2024-03-28 ENCOUNTER — Other Ambulatory Visit: Payer: Self-pay | Admitting: Nurse Practitioner

## 2024-03-28 NOTE — Telephone Encounter (Unsigned)
 Copied from CRM #[id number]. Topic: Clinical - Medication Refill >> Mar 28, 2024 10:41 AM Gustabo D wrote: Medication: tirzepatide  (MOUNJARO ) 15 MG/0.5ML Pen  Has the patient contacted their pharmacy? Yes they told her they requested it (Agent: If no, request that the patient contact the pharmacy for the refill. If patient does not wish to contact the pharmacy document the reason why and proceed with request.) (Agent: If yes, when and what did the pharmacy advise?)  This is the patient's preferred pharmacy:  [facility] [place], [place] - [address] [place] [address] Phone: [phone] Fax: [phone]  Is this the correct pharmacy for this prescription? Yes If no, delete pharmacy and type the correct one.   Has the prescription been filled recently? No  Is the patient out of the medication? Yes  Has the patient been seen for an appointment in the last year OR does the patient have an upcoming appointment? Yes  Can we respond through MyChart? Yes  Agent: Please be advised that Rx refills may take up to 3 business days. We ask that you follow-up with your pharmacy.

## 2024-03-30 NOTE — Telephone Encounter (Signed)
 Requested medication (s) are due for refill today -yes  Requested medication (s) are on the active medication list -yes  Future visit scheduled -yes  Last refill: 09/23/23 6ml 1RF  Notes to clinic: off protocol- provider review   Requested Prescriptions  Pending Prescriptions Disp Refills   tirzepatide  (MOUNJARO ) 15 MG/0.5ML Pen 6 mL 1    Sig: Inject 15 mg into the skin once a week.     Off-Protocol Failed - 03/30/2024  3:55 PM      Failed - Medication not assigned to a protocol, review manually.      Passed - Valid encounter within last 12 months    Recent Outpatient Visits           8 months ago Type 2 diabetes mellitus without complication, without long-term current use of insulin [facility])    [facility] Leavy Mole, PA-C                 Requested Prescriptions  Pending Prescriptions Disp Refills   tirzepatide  (MOUNJARO ) 15 MG/0.5ML Pen 6 mL 1    Sig: Inject 15 mg into the skin once a week.     Off-Protocol Failed - 03/30/2024  3:55 PM      Failed - Medication not assigned to a protocol, review manually.      Passed - Valid encounter within last 12 months    Recent Outpatient Visits           8 months ago Type 2 diabetes mellitus without complication, without long-term current use of insulin [facility])   [facility] Leavy Mole, PA-C

## 2024-03-31 ENCOUNTER — Telehealth: Payer: Self-pay

## 2024-03-31 ENCOUNTER — Encounter: Payer: Self-pay | Admitting: Family Medicine

## 2024-03-31 ENCOUNTER — Ambulatory Visit (INDEPENDENT_AMBULATORY_CARE_PROVIDER_SITE_OTHER): Admitting: Family Medicine

## 2024-03-31 VITALS — BP 124/70 | HR 92 | Resp 16 | Ht 61.0 in | Wt 191.0 lb

## 2024-03-31 DIAGNOSIS — Z13220 Encounter for screening for lipoid disorders: Secondary | ICD-10-CM

## 2024-03-31 DIAGNOSIS — E66812 Obesity, class 2: Secondary | ICD-10-CM

## 2024-03-31 DIAGNOSIS — Z6836 Body mass index (BMI) 36.0-36.9, adult: Secondary | ICD-10-CM

## 2024-03-31 DIAGNOSIS — E119 Type 2 diabetes mellitus without complications: Secondary | ICD-10-CM

## 2024-03-31 DIAGNOSIS — F419 Anxiety disorder, unspecified: Secondary | ICD-10-CM

## 2024-03-31 DIAGNOSIS — D509 Iron deficiency anemia, unspecified: Secondary | ICD-10-CM

## 2024-03-31 DIAGNOSIS — Z7985 Long-term (current) use of injectable non-insulin antidiabetic drugs: Secondary | ICD-10-CM

## 2024-03-31 DIAGNOSIS — E559 Vitamin D deficiency, unspecified: Secondary | ICD-10-CM

## 2024-03-31 MED ORDER — BUSPIRONE HCL 5 MG PO TABS: 5.0000 mg | ORAL_TABLET | Freq: Three times a day (TID) | ORAL | 0 refills | Status: DC | PRN

## 2024-03-31 MED ORDER — TIRZEPATIDE 15 MG/0.5ML ~~LOC~~ SOAJ: 15.0000 mg | SUBCUTANEOUS | 1 refills | Status: AC

## 2024-03-31 NOTE — Progress Notes (Signed)
 Established Patient Office Visit  Subjective   Patient ID: MAKHYA ARAVE, female    DOB: 1986/01/22  Age: 38 y.o. MRN: [id number]  Chief Complaint  Patient presents with   Transfer of Care   Medication Refill    HPI Patient is a pleasant 38 year old female who is seen in office today for medication refills. She is a new patient to myself, however well established in office. She requests refills on Mounjaro  and Ergocalciferol .   She voices frustration with her weight, weight loss stalling. She voices in 2022 she underwent a gastric sleeve and lost 40lbs after surgery. She started GLP1 medications in 2023 and has lost almost 30lbs over the last two years. She reports that inflammation and swelling has improved with GLP1 medications. She has tolerated Mounjaro  well and continues 15mg  weekly. She is most frustrated because she follows a diet where she focuses on nutrient dense foods and increased protein intake but still weight loss is slowed compared to some others. She was previously in a group led by a physician and states she had to leave the group after becoming frustrated when comparing her weight loss progress to others. She does admit exercise has decreased over the holiday season but previously was walking, boxing, and weight lifting. She plans to again increase activity/exercise. We did review that when building muscle mass that often that weight loss might slow or stall but that does not necessarily  mean that she is not losing inches and making progress. She is receptive.   Discussed that sleep and stress management also impact weight loss. She denies snoring or non-restorative sleep. She voices prior to weight loss she did snore, but as noted she reports she does not snore now. She voices she sleeps ok but does struggle with anxiety. She voices she has a hard time turning her brain off. She voices work is stressful as well. She states in her early 20s she was on a medication as needed,  but cannot recall the name of the medication.    Will note pap screening addressed and she voices this was done in 2024 and recommended 3 year follow up. Will attempt to obtain record.   Past Medical History:  Diagnosis Date   Abnormal cervical Pap smear with positive HPV DNA test 01/26/2015   Anemia    GERD (gastroesophageal reflux disease)    Low back pain 01/26/2015   Migraine    Prediabetes 02/05/2017   Current, last A1C 07/2017 6.3      Past Surgical History:  Procedure Laterality Date   CESAREAN SECTION     LAPAROSCOPIC GASTRIC SLEEVE RESECTION  2022   TONSILLECTOMY       Review of Systems  Constitutional:  Positive for malaise/fatigue.  Neurological:  Negative for tingling.  Psychiatric/Behavioral:  The patient is nervous/anxious. The patient does not have insomnia.       Objective:     BP 124/70   Pulse 92   Resp 16   Ht 5' 1 (1.549 m)   Wt 191 lb (86.6 kg)   SpO2 98%   BMI 36.09 kg/m  BP Readings from Last 3 Encounters:  03/31/24 124/70  07/09/23 128/78  04/20/23 124/84   Wt Readings from Last 3 Encounters:  03/31/24 191 lb (86.6 kg)  07/09/23 200 lb (90.7 kg)  04/20/23 204 lb 8 oz (92.8 kg)      Physical Exam Constitutional:      Appearance: Normal appearance.  Cardiovascular:  Rate and Rhythm: Normal rate and regular rhythm.     Pulses: Normal pulses.          Dorsalis pedis pulses are 2+ on the right side and 2+ on the left side.       Posterior tibial pulses are 2+ on the right side and 2+ on the left side.     Heart sounds: Normal heart sounds.  Pulmonary:     Effort: Pulmonary effort is normal.     Breath sounds: Normal breath sounds. No wheezing, rhonchi or rales.  Musculoskeletal:     Right lower leg: No edema.     Left lower leg: No edema.  Feet:     Right foot:     Protective Sensation: 8 sites tested.  8 sites sensed.     Skin integrity: Callus and dry skin present. No ulcer, blister or skin breakdown.     Left foot:      Protective Sensation: 8 sites tested.  8 sites sensed.     Skin integrity: Callus and dry skin present. No ulcer, blister or skin breakdown.  Skin:    General: Skin is warm and dry.  Neurological:     General: No focal deficit present.     Mental Status: She is alert.  Psychiatric:        Mood and Affect: Mood normal.        Behavior: Behavior normal.     Last CBC Lab Results  Component Value Date   WBC 5.1 07/09/2023   HGB 11.7 07/09/2023   HCT 37.7 07/09/2023   MCV 80.6 07/09/2023   MCH 25.0 (L) 07/09/2023   RDW 14.1 07/09/2023   PLT 331 07/09/2023   Last metabolic panel Lab Results  Component Value Date   GLUCOSE 77 07/09/2023   NA 141 07/09/2023   K 4.3 07/09/2023   CL 105 07/09/2023   CO2 28 07/09/2023   BUN 11 07/09/2023   CREATININE 0.55 07/09/2023   EGFR 114 01/26/2023   CALCIUM 8.6 07/09/2023   PROT 7.0 07/09/2023   ALBUMIN 3.4 (L) 12/10/2019   LABGLOB 3.7 08/01/2015   AGRATIO 1.1 (L) 08/01/2015   BILITOT 0.4 07/09/2023   ALKPHOS 68 12/10/2019   AST 15 07/09/2023   ALT 7 07/09/2023   ANIONGAP 8 12/10/2019   Last lipids Lab Results  Component Value Date   CHOL 159 01/26/2023   HDL 60 01/26/2023   LDLCALC 86 01/26/2023   TRIG 53 01/26/2023   CHOLHDL 2.7 01/26/2023   Last hemoglobin A1c Lab Results  Component Value Date   HGBA1C 5.6 07/09/2023   Last thyroid  functions Lab Results  Component Value Date   TSH 3.85 07/28/2017   Last vitamin D  Lab Results  Component Value Date   VD25OH 27 (L) 07/09/2023          Assessment & Plan:   Assessment & Plan Type 2 diabetes mellitus without complication, without long-term current use of insulin (HCC) Type 2 DM managed with Mounjaro  and other lifestyle changes. She reports focusing on a diet that is nutrient dense with increased lean protein. She is active and plans to increase exercise once again.  Type 2 DM well controlled. A1c at goal. Goal A1c of less than 7 and patient's last A1c on  07/09/23 was 5.6. Foot exam completed, see physical exam findings. Sensation intact. She denies numbness or tingling.   -Continue Mounjaro  15mg  subcutaneous injection once weekly. Refills provided. -Continue with lifestyle modifications including dietary changes, regular  exercise and weight loss -Updated labs including A1c.   Orders:   HgB A1c   Comprehensive Metabolic Panel (CMET)   Urine Microalbumin w/creat. ratio   HM Diabetes Foot Exam   tirzepatide  (MOUNJARO ) 15 MG/0.5ML Pen; Inject 15 mg into the skin once a week.   Lipid Profile  Anxiety We had discussed that stress management and anxiety can impair weight loss. She did admit to uncontrolled anxiety, several life stressors but work appears to be significant stressor. She voices she had medication for management of anxiety in her 41s but cannot recall the name of the medication. She had thought about starting medication in the recent past but her appointment was cancelled resulting her feeling discouraged to return to discuss anxiety after cancelled appointment. She has also thought about counseling/ therapy.  During the visit we discussed starting medication for anxiety, which she is receptive of, and further voices excited about this. She hopes that this will allow her to handle stress better and respond differently in situations at times. Situations she refers to parenting situations and other challenges.   -Encouraged that she follow through with counseling/therapy. She states she will look into this.  -Discussed starting Buspar  5mg  with instructions to take one tablet once daily but may take one tablet up to 3 times daily. She voices understanding and is agreeable.  -Advised that potential Buspar  could cause drowsiness/ sleepiness and recommended starting in the evening first to see how she responds to medication. She again voices understanding. -Plan for 6 week f/u to check in on anxiety. She is agreeable.   Orders:   busPIRone   (BUSPAR ) 5 MG tablet; Take 1 tablet (5 mg total) by mouth 3 (three) times daily as needed (anxiety). Take 1 tablet (5mg ) by mouth once daily. Ok to take up to three times daily as needed. Max of 3 tablets in 24 hour period.  Morbid obesity (HCC) BMI > 35 with comorbidity (type 2 DM).  Patient continues to lose weight and has lost approximately 70lbs over the last 3 years. She underwent surgical intervention (gastric sleeve), started GLP1 medication (initially [facility] and now Mounjaro ), and continues with dietary modifications and other lifestyle changes for management of weight and noted comorbidity.   -Encouraged continued dietary modifications focusing on nutrient dense, increased protein diet -Encouraged increase in exercise including weight lifting and cardio as she was previously doing prior to the Holiday season. -Labs updated.  Orders:   TSH  Vitamin D  deficiency Vitamin D  deficiency managed with Ergocalciferol , however she admits she has not been taking medication consistently. Last vitamin D  level low at 27 on 07/09/23.  -Ergocalciferol  refilled and reinforced importance of medication adherence.  -Discussed that low vitamin D  can impact energy levels, anxiety and mood.  -Continue Ergocalciferol  1.25mg  (50,000 units) one capsule once weekly.  -Updated labs.   Orders:   Vitamin D , Ergocalciferol , (DRISDOL ) 1.25 MG (50000 UNIT) CAPS capsule; Take 1 capsule (50,000 Units total) by mouth every 7 (seven) days.   Vitamin D  (25 hydroxy)  Iron  deficiency anemia, unspecified iron  deficiency anemia type Iron  deficiency anemia confirmed and last iron  profile showed low iron  levels. Last iron  saturation of 10%.  She reports she was previously following with hem/onc for management of iron  deficiency anemia. She has had iron  transfusions before and voices with the transfusions her iron  only went up a little. She states she is suppose to take iron  supplements, not daily but at least twice weekly.  She reports she is tired but suspects this  could be due to Mounjaro .   -Discussed that low iron / uncontrolled iron  deficiency can affect energy levels causing fatigue.  -Recommended consistent daily oral supplementation as previously advised. -Update labs today.   Orders:   CBC w/Diff/Platelet   Fe+TIBC+Fer  Lipid screening Lipid screening warranted with co-morbidities including type 2 DM and BMI >35. Last lipid panel completed 01/2023 and at goal. LDL of 86.  -Continue with lifestyle modifications- diet, exercise and weight loss  -No further interventions needed at this time as lipids controlled without medication management, no evidence of dyslipidemia or other hyperlipidemia  Orders:   Lipid Profile      Return in about 6 weeks (around 05/12/2024).    LAYMON LOISE CORE, FNP

## 2024-03-31 NOTE — Assessment & Plan Note (Addendum)
 Vitamin D  deficiency managed with Ergocalciferol , however she admits she has not been taking medication consistently. Last vitamin D  level low at 27 on 07/09/23.  -Ergocalciferol  refilled and reinforced importance of medication adherence.  -Discussed that low vitamin D  can impact energy levels, anxiety and mood.  -Continue Ergocalciferol  1.25mg  (50,000 units) one capsule once weekly.  -Updated labs.   Orders:   Vitamin D , Ergocalciferol , (DRISDOL ) 1.25 MG (50000 UNIT) CAPS capsule; Take 1 capsule (50,000 Units total) by mouth every 7 (seven) days.   Vitamin D  (25 hydroxy)

## 2024-03-31 NOTE — Assessment & Plan Note (Addendum)
 Iron  deficiency anemia confirmed and last iron  profile showed low iron  levels. Last iron  saturation of 10%.  She reports she was previously following with hem/onc for management of iron  deficiency anemia. She has had iron  transfusions before and voices with the transfusions her iron  only went up a little. She states she is suppose to take iron  supplements, not daily but at least twice weekly. She reports she is tired but suspects this could be due to Mounjaro .   -Discussed that low iron / uncontrolled iron  deficiency can affect energy levels causing fatigue.  -Recommended consistent daily oral supplementation as previously advised. -Update labs today.   Orders:   CBC w/Diff/Platelet   Fe+TIBC+Fer

## 2024-03-31 NOTE — Telephone Encounter (Unsigned)
 Copied from CRM #[id number]. Topic: General - Other >> Mar 31, 2024 11:39 AM Cassandra G wrote: Requesting return call from production designer, theatre/television/film. Not pleased with how everything was handled with Leisa patients.

## 2024-03-31 NOTE — Assessment & Plan Note (Addendum)
 Type 2 DM managed with Mounjaro  and other lifestyle changes. She reports focusing on a diet that is nutrient dense with increased lean protein. She is active and plans to increase exercise once again.  Type 2 DM well controlled. A1c at goal. Goal A1c of less than 7 and patient's last A1c on 07/09/23 was 5.6. Foot exam completed, see physical exam findings. Sensation intact. She denies numbness or tingling.   -Continue Mounjaro  15mg  subcutaneous injection once weekly. Refills provided. -Continue with lifestyle modifications including dietary changes, regular exercise and weight loss -Updated labs including A1c.   Orders:   HgB A1c   Comprehensive Metabolic Panel (CMET)   Urine Microalbumin w/creat. ratio   HM Diabetes Foot Exam   tirzepatide  (MOUNJARO ) 15 MG/0.5ML Pen; Inject 15 mg into the skin once a week.   Lipid Profile

## 2024-04-01 ENCOUNTER — Ambulatory Visit: Payer: Self-pay | Admitting: Family Medicine

## 2024-04-01 DIAGNOSIS — D649 Anemia, unspecified: Secondary | ICD-10-CM

## 2024-04-03 LAB — CBC WITH DIFFERENTIAL/PLATELET
Absolute Lymphocytes: 2336 {cells}/uL (ref 850–3900)
Absolute Monocytes: 531 {cells}/uL (ref 200–950)
Basophils Absolute: 31 {cells}/uL (ref 0–200)
Basophils Relative: 0.5 %
Eosinophils Absolute: 110 {cells}/uL (ref 15–500)
Eosinophils Relative: 1.8 %
HCT: 33.2 % — ABNORMAL LOW (ref 35.9–46.0)
Hemoglobin: 9.7 g/dL — ABNORMAL LOW (ref 11.7–15.5)
MCH: 20.9 pg — ABNORMAL LOW (ref 27.0–33.0)
MCHC: 29.2 g/dL — ABNORMAL LOW (ref 31.6–35.4)
MCV: 71.6 fL — ABNORMAL LOW (ref 81.4–101.7)
MPV: 9.5 fL (ref 7.5–12.5)
Monocytes Relative: 8.7 %
Neutro Abs: 3093 {cells}/uL (ref 1500–7800)
Neutrophils Relative %: 50.7 %
Platelets: 536 Thousand/uL — ABNORMAL HIGH (ref 140–400)
RBC: 4.64 Million/uL (ref 3.80–5.10)
RDW: 16 % — ABNORMAL HIGH (ref 11.0–15.0)
Total Lymphocyte: 38.3 %
WBC: 6.1 Thousand/uL (ref 3.8–10.8)

## 2024-04-03 LAB — COMPREHENSIVE METABOLIC PANEL WITH GFR
AG Ratio: 1.2 (calc) (ref 1.0–2.5)
ALT: 6 U/L (ref 6–29)
AST: 12 U/L (ref 10–30)
Albumin: 3.8 g/dL (ref 3.6–5.1)
Alkaline phosphatase (APISO): 68 U/L (ref 31–125)
BUN: 8 mg/dL (ref 7–25)
CO2: 25 mmol/L (ref 20–32)
Calcium: 8.4 mg/dL — ABNORMAL LOW (ref 8.6–10.2)
Chloride: 106 mmol/L (ref 98–110)
Creat: 0.7 mg/dL (ref 0.50–0.97)
Globulin: 3.3 g/dL (ref 1.9–3.7)
Glucose, Bld: 79 mg/dL (ref 65–99)
Potassium: 4.1 mmol/L (ref 3.5–5.3)
Sodium: 138 mmol/L (ref 135–146)
Total Bilirubin: 0.4 mg/dL (ref 0.2–1.2)
Total Protein: 7.1 g/dL (ref 6.1–8.1)
eGFR: 113 mL/min/1.73m2

## 2024-04-03 LAB — MICROALBUMIN / CREATININE URINE RATIO
Creatinine, Urine: 243 mg/dL (ref 20–275)
Microalb Creat Ratio: 6 mg/g{creat}
Microalb, Ur: 1.4 mg/dL

## 2024-04-03 LAB — TEST AUTHORIZATION

## 2024-04-03 LAB — IRON,TIBC AND FERRITIN PANEL
%SAT: 5 % — ABNORMAL LOW (ref 16–45)
Ferritin: 7 ng/mL — ABNORMAL LOW (ref 16–154)
Iron: 13 ug/dL — ABNORMAL LOW (ref 40–190)
TIBC: 278 ug/dL (ref 250–450)

## 2024-04-03 LAB — LIPID PANEL
Cholesterol: 142 mg/dL
HDL: 64 mg/dL
LDL Cholesterol (Calc): 65 mg/dL
Non-HDL Cholesterol (Calc): 78 mg/dL
Total CHOL/HDL Ratio: 2.2 (calc)
Triglycerides: 45 mg/dL

## 2024-04-03 LAB — B12 AND FOLATE PANEL
Folate: 7 ng/mL
Vitamin B-12: 579 pg/mL (ref 200–1100)

## 2024-04-03 LAB — HEMOGLOBIN A1C
Hgb A1c MFr Bld: 5.5 % (ref <5.7)
Mean Plasma Glucose: 111 mg/dL
eAG (mmol/L): 6.2 mmol/L

## 2024-04-03 LAB — VITAMIN D 25 HYDROXY (VIT D DEFICIENCY, FRACTURES): Vit D, 25-Hydroxy: 26 ng/mL — ABNORMAL LOW (ref 30–100)

## 2024-04-03 LAB — TSH: TSH: 1.7 m[IU]/L

## 2024-05-12 ENCOUNTER — Telehealth: Admitting: Family Medicine

## 2024-05-12 DIAGNOSIS — F419 Anxiety disorder, unspecified: Secondary | ICD-10-CM

## 2024-05-12 DIAGNOSIS — E559 Vitamin D deficiency, unspecified: Secondary | ICD-10-CM

## 2024-05-12 DIAGNOSIS — D509 Iron deficiency anemia, unspecified: Secondary | ICD-10-CM

## 2024-05-12 MED ORDER — BUSPIRONE HCL 5 MG PO TABS: 5.0000 mg | ORAL_TABLET | Freq: Three times a day (TID) | ORAL | 4 refills | Status: AC | PRN

## 2024-05-12 NOTE — Progress Notes (Signed)
 "                    MyChart Video Visit    Virtual Visit via Video Note   This format is felt to be most appropriate for this patient at this time. Physical exam was limited by quality of the video and audio technology used for the visit.    Patient location: work/ her office  Provider location:  [facility] Persons involved in the visit: patient, provider  I discussed the limitations of evaluation and management by telemedicine and the availability of in person appointments. The patient expressed understanding and agreed to proceed.  Patient: Sherri Robertson   DOB: Jul 15, 1985   39 y.o. Female  MRN: [id number] Visit Date: 05/12/2024  Today's healthcare provider: LAYMON LOISE CORE, FNP   Chief Complaint  Patient presents with   Follow-up    6 weeks    Subjective:    HPI  HPI Patient is a pleasant 39 year old female who is seen for 6 week follow up via telemedicine. F/u on anxiety. She was started on Buspar  at her last visit and reports this is going well. She voices she does feel better. She finds herself to be less anxious. She voices she takes medication every night, and voices she has not had to take the medication 3 times a day as needed as directed. Discussed continuing Buspar  5mg  nightly, no changes.  She also reports she has been more consistent vitamin supplementation as previously recommended. She voices no new concerns or complaints at today's visit. We discussed a 4 to 6 month f/u which she is agreeable.   ROS Negative except as per HPI   Last CBC Lab Results  Component Value Date   WBC 6.1 03/31/2024   HGB 9.7 (L) 03/31/2024   HCT 33.2 (L) 03/31/2024   MCV 71.6 (L) 03/31/2024   MCH 20.9 (L) 03/31/2024   RDW 16.0 (H) 03/31/2024   PLT 536 (H) 03/31/2024   Last metabolic panel Lab Results  Component Value Date   GLUCOSE 79 03/31/2024   NA 138 03/31/2024   K 4.1 03/31/2024   CL 106 03/31/2024   CO2 25 03/31/2024   BUN 8  03/31/2024   CREATININE 0.70 03/31/2024   EGFR 113 03/31/2024   CALCIUM 8.4 (L) 03/31/2024   PROT 7.1 03/31/2024   ALBUMIN 3.4 (L) 12/10/2019   LABGLOB 3.7 08/01/2015   AGRATIO 1.1 (L) 08/01/2015   BILITOT 0.4 03/31/2024   ALKPHOS 68 12/10/2019   AST 12 03/31/2024   ALT 6 03/31/2024   ANIONGAP 8 12/10/2019   Last lipids Lab Results  Component Value Date   CHOL 142 03/31/2024   HDL 64 03/31/2024   LDLCALC 65 03/31/2024   TRIG 45 03/31/2024   CHOLHDL 2.2 03/31/2024   Last hemoglobin A1c Lab Results  Component Value Date   HGBA1C 5.5 03/31/2024   Last thyroid  functions Lab Results  Component Value Date   TSH 1.70 03/31/2024   Last vitamin D  Lab Results  Component Value Date   VD25OH 26 (L) 03/31/2024   Last vitamin B12 and Folate Lab Results  Component Value Date   VITAMINB12 579 03/31/2024   FOLATE 7.0 03/31/2024         Objective:    There were no vitals taken for this visit.   Physical Exam Pulmonary:     Effort: Pulmonary effort is normal.  Neurological:     Mental Status: She is alert.  Psychiatric:        Mood and Affect: Mood normal.        Behavior: Behavior normal.     Physical exam limited due to virtual visit.     Assessment & Plan:    Assessment & Plan Anxiety Anxiety improved, now well controlled. See HPI for details.  -Continue Buspar  5mg  nightly, ok to take TID as needed for anxiety. Refill provided -Return in 4 to 6 months for f/u  Orders:   busPIRone  (BUSPAR ) 5 MG tablet; Take 1 tablet (5 mg total) by mouth 3 (three) times daily as needed (anxiety). Take 1 tablet (5mg ) by mouth once daily. Ok to take up to three times daily as needed. Max of 3 tablets in 24 hour period.  Vitamin D  deficiency Vitamin D  deficiency uncontrolled, last vitamin D  level 26 in 03/2024. She had reported poor compliance with Ergocalciferol  but now reports she is more consistent.   -Continue Ergocalciferol  weekly as directed. No refills needed at this  time. Advised patient to contact office when refills are needed if this is prior to scheduled follow up -Return in 4 to 6 months for f/u     Iron  deficiency anemia, unspecified iron  deficiency anemia type Iron  deficiency anemia uncontrolled, last iron  saturation 5 in 03/2024. She had reported poor compliance with oral iron  supplementation but reports now she is now more consistent, typically taking every other day.  -Continue with oral iron  supplementation -Return in 4 to 6 months for f/u       Return in about 4 months (around 09/09/2024) for chronic condition follow up .     I discussed the assessment and treatment plan with the patient. The patient was provided an opportunity to ask questions and all were answered. The patient agreed with the plan and demonstrated an understanding of the instructions.   The patient was advised to call back or seek an in-person evaluation if the symptoms worsen or if the condition fails to improve as anticipated.  I provided 5 minutes of non-face-to-face time during this encounter.  LAYMON LOISE CORE, FNP [facility]    "

## 2024-05-12 NOTE — Assessment & Plan Note (Signed)
 Vitamin D  deficiency uncontrolled, last vitamin D  level 26 in 03/2024. She had reported poor compliance with Ergocalciferol  but now reports she is more consistent.   -Continue Ergocalciferol  weekly as directed. No refills needed at this time. Advised patient to contact office when refills are needed if this is prior to scheduled follow up -Return in 4 to 6 months for f/u

## 2024-05-12 NOTE — Assessment & Plan Note (Signed)
 Iron  deficiency anemia uncontrolled, last iron  saturation 5 in 03/2024. She had reported poor compliance with oral iron  supplementation but reports now she is now more consistent, typically taking every other day.  -Continue with oral iron  supplementation -Return in 4 to 6 months for f/u
